# Patient Record
Sex: Female | Born: 1985
Health system: Southern US, Community
[De-identification: ages and names within clinical notes are randomized; demographics above are authoritative.]

## PROBLEM LIST (undated history)

## (undated) ENCOUNTER — Inpatient Hospital Stay (HOSPITAL_COMMUNITY): Payer: Self-pay

## (undated) DIAGNOSIS — T7840XA Allergy, unspecified, initial encounter: Secondary | ICD-10-CM

## (undated) DIAGNOSIS — K219 Gastro-esophageal reflux disease without esophagitis: Secondary | ICD-10-CM

## (undated) DIAGNOSIS — G43909 Migraine, unspecified, not intractable, without status migrainosus: Secondary | ICD-10-CM

## (undated) DIAGNOSIS — D649 Anemia, unspecified: Secondary | ICD-10-CM

## (undated) DIAGNOSIS — R519 Headache, unspecified: Secondary | ICD-10-CM

## (undated) DIAGNOSIS — R51 Headache: Secondary | ICD-10-CM

## (undated) DIAGNOSIS — G039 Meningitis, unspecified: Secondary | ICD-10-CM

## (undated) HISTORY — DX: Allergy, unspecified, initial encounter: T78.40XA

## (undated) HISTORY — PX: WISDOM TOOTH EXTRACTION: SHX21

## (undated) HISTORY — DX: Anemia, unspecified: D64.9

---
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
## 2006-08-17 ENCOUNTER — Other Ambulatory Visit: Admission: RE | Admit: 2006-08-17 | Discharge: 2006-08-17 | Payer: Self-pay | Admitting: Obstetrics & Gynecology

## 2007-02-05 ENCOUNTER — Other Ambulatory Visit: Admission: RE | Admit: 2007-02-05 | Discharge: 2007-02-05 | Payer: Self-pay | Admitting: Obstetrics and Gynecology

## 2007-09-03 ENCOUNTER — Other Ambulatory Visit: Admission: RE | Admit: 2007-09-03 | Discharge: 2007-09-03 | Payer: Self-pay | Admitting: Obstetrics and Gynecology

## 2008-09-08 ENCOUNTER — Other Ambulatory Visit: Admission: RE | Admit: 2008-09-08 | Discharge: 2008-09-08 | Payer: Self-pay | Admitting: Obstetrics and Gynecology

## 2010-06-24 ENCOUNTER — Emergency Department (HOSPITAL_COMMUNITY): Admission: EM | Admit: 2010-06-24 | Discharge: 2010-06-25 | Payer: Self-pay | Admitting: Emergency Medicine

## 2010-09-19 LAB — HM PAP SMEAR

## 2011-09-05 DIAGNOSIS — G039 Meningitis, unspecified: Secondary | ICD-10-CM

## 2011-09-05 HISTORY — DX: Meningitis, unspecified: G03.9

## 2011-11-27 ENCOUNTER — Inpatient Hospital Stay (HOSPITAL_COMMUNITY)
Admission: EM | Admit: 2011-11-27 | Discharge: 2011-12-01 | DRG: 533 | Disposition: A | Discharge status: Home / Self Care | Payer: BC Managed Care – PPO | Attending: Internal Medicine | Admitting: Internal Medicine

## 2011-11-27 ENCOUNTER — Encounter (HOSPITAL_COMMUNITY): Payer: Self-pay | Admitting: *Deleted

## 2011-11-27 ENCOUNTER — Emergency Department (HOSPITAL_COMMUNITY): Payer: BC Managed Care – PPO

## 2011-11-27 DIAGNOSIS — E860 Dehydration: Secondary | ICD-10-CM | POA: Diagnosis not present

## 2011-11-27 DIAGNOSIS — A879 Viral meningitis, unspecified: Principal | ICD-10-CM | POA: Diagnosis present

## 2011-11-27 DIAGNOSIS — T368X5A Adverse effect of other systemic antibiotics, initial encounter: Secondary | ICD-10-CM | POA: Diagnosis not present

## 2011-11-27 DIAGNOSIS — G039 Meningitis, unspecified: Secondary | ICD-10-CM

## 2011-11-27 DIAGNOSIS — D72829 Elevated white blood cell count, unspecified: Secondary | ICD-10-CM | POA: Diagnosis present

## 2011-11-27 DIAGNOSIS — T375X5A Adverse effect of antiviral drugs, initial encounter: Secondary | ICD-10-CM | POA: Diagnosis not present

## 2011-11-27 DIAGNOSIS — N179 Acute kidney failure, unspecified: Secondary | ICD-10-CM | POA: Diagnosis not present

## 2011-11-27 LAB — GRAM STAIN

## 2011-11-27 LAB — BASIC METABOLIC PANEL
BUN: 7 mg/dL (ref 6–23)
CO2: 21 mEq/L (ref 19–32)
Calcium: 9 mg/dL (ref 8.4–10.5)
Chloride: 102 mEq/L (ref 96–112)
Creatinine, Ser: 0.65 mg/dL (ref 0.50–1.10)
GFR calc Af Amer: >90 mL/min (ref >90)
GFR calc non Af Amer: >90 mL/min (ref >90)
Glucose, Bld: 82 mg/dL (ref 70–99)
Potassium: 4.2 mEq/L (ref 3.5–5.1)
Sodium: 132 mEq/L — ABNORMAL LOW (ref 135–145)

## 2011-11-27 LAB — CSF CELL COUNT WITH DIFFERENTIAL
Eosinophils, CSF: 0 % (ref 0–1)
Eosinophils, CSF: 0 % (ref 0–1)
Lymphs, CSF: 62 % (ref 40–80)
Lymphs, CSF: 55 % (ref 40–80)
Monocyte-Macrophage-Spinal Fluid: 18 % (ref 15–45)
Monocyte-Macrophage-Spinal Fluid: 13 % — ABNORMAL LOW (ref 15–45)
RBC Count, CSF: 22 /mm3 — ABNORMAL HIGH
RBC Count, CSF: 2 /mm3 — ABNORMAL HIGH
Segmented Neutrophils-CSF: 20 % — ABNORMAL HIGH (ref 0–6)
Segmented Neutrophils-CSF: 32 % — ABNORMAL HIGH (ref 0–6)
Tube #: 1
Tube #: 4
WBC, CSF: 107 /mm3 (ref 0–5)
WBC, CSF: 148 /mm3 (ref 0–5)

## 2011-11-27 LAB — URINALYSIS, ROUTINE W REFLEX MICROSCOPIC
Bilirubin Urine: NEGATIVE
Glucose, UA: NEGATIVE mg/dL
Hgb urine dipstick: NEGATIVE
Ketones, ur: NEGATIVE mg/dL
Leukocytes, UA: NEGATIVE
Nitrite: NEGATIVE
Protein, ur: NEGATIVE mg/dL
Specific Gravity, Urine: 1.022 (ref 1.005–1.030)
Urobilinogen, UA: 0.2 mg/dL (ref 0.0–1.0)
pH: 6 (ref 5.0–8.0)

## 2011-11-27 LAB — DIFFERENTIAL
Basophils Absolute: 0 10*3/uL (ref 0.0–0.1)
Basophils Relative: 0 % (ref 0–1)
Eosinophils Absolute: 0.1 10*3/uL (ref 0.0–0.7)
Eosinophils Relative: 1 % (ref 0–5)
Lymphocytes Relative: 31 % (ref 12–46)
Lymphs Abs: 2.1 10*3/uL (ref 0.7–4.0)
Monocytes Absolute: 0.5 10*3/uL (ref 0.1–1.0)
Monocytes Relative: 7 % (ref 3–12)
Neutro Abs: 4.1 10*3/uL (ref 1.7–7.7)
Neutrophils Relative %: 61 % (ref 43–77)

## 2011-11-27 LAB — CBC
HCT: 37.4 % (ref 36.0–46.0)
Hemoglobin: 12.7 g/dL (ref 12.0–15.0)
MCH: 29.3 pg (ref 26.0–34.0)
MCHC: 34 g/dL (ref 30.0–36.0)
MCV: 86.4 fL (ref 78.0–100.0)
Platelets: 313 10*3/uL (ref 150–400)
RBC: 4.33 MIL/uL (ref 3.87–5.11)
RDW: 12.2 % (ref 11.5–15.5)
WBC: 6.8 10*3/uL (ref 4.0–10.5)

## 2011-11-27 LAB — PROTEIN AND GLUCOSE, CSF
Glucose, CSF: 51 mg/dL (ref 43–76)
Total  Protein, CSF: 26 mg/dL (ref 15–45)

## 2011-11-27 MED ORDER — VANCOMYCIN HCL IN DEXTROSE 1-5 GM/200ML-% IV SOLN
1000.0000 mg | Freq: Once | INTRAVENOUS | Status: AC
  Administered 2011-11-27: 1000 mg via INTRAVENOUS
  Filled 2011-11-27: qty 200

## 2011-11-27 MED ORDER — ACETAMINOPHEN 325 MG PO TABS
650.0000 mg | ORAL_TABLET | Freq: Once | ORAL | Status: AC
  Administered 2011-11-27: 650 mg via ORAL
  Filled 2011-11-27: qty 2

## 2011-11-27 MED ORDER — SODIUM CHLORIDE 0.9 % IJ SOLN
3.0000 mL | Freq: Two times a day (BID) | INTRAMUSCULAR | Status: DC
  Administered 2011-11-29 – 2011-11-30 (×2): 3 mL via INTRAVENOUS

## 2011-11-27 MED ORDER — SODIUM CHLORIDE 0.9 % IV SOLN
INTRAVENOUS | Status: DC
  Administered 2011-11-27: 18:00:00 via INTRAVENOUS

## 2011-11-27 MED ORDER — HYDROMORPHONE HCL PF 1 MG/ML IJ SOLN
1.0000 mg | INTRAMUSCULAR | Status: DC | PRN
  Administered 2011-11-27: 1 mg via INTRAVENOUS

## 2011-11-27 MED ORDER — SODIUM CHLORIDE 0.9 % IV SOLN
INTRAVENOUS | Status: DC
  Administered 2011-11-27: 16:00:00 via INTRAVENOUS

## 2011-11-27 MED ORDER — CEFTRIAXONE SODIUM 2 G IJ SOLR
2.0000 g | INTRAMUSCULAR | Status: DC
  Administered 2011-11-27: 2 g via INTRAVENOUS
  Filled 2011-11-27: qty 2

## 2011-11-27 MED ORDER — ONDANSETRON HCL 4 MG/2ML IJ SOLN: 4.0000 mg | Freq: Three times a day (TID) | INTRAMUSCULAR | Status: DC | PRN

## 2011-11-27 MED ORDER — OXYCODONE HCL 5 MG PO TABS
5.0000 mg | ORAL_TABLET | ORAL | Status: DC | PRN
  Administered 2011-11-28 (×2): 5 mg via ORAL
  Filled 2011-11-27 (×2): qty 1

## 2011-11-27 MED ORDER — ONDANSETRON HCL 4 MG PO TABS
4.0000 mg | ORAL_TABLET | Freq: Four times a day (QID) | ORAL | Status: DC | PRN
  Administered 2011-11-28: 4 mg via ORAL
  Filled 2011-11-27: qty 1

## 2011-11-27 MED ORDER — SODIUM CHLORIDE 0.9 % IJ SOLN: 3.0000 mL | INTRAMUSCULAR | Status: DC | PRN

## 2011-11-27 MED ORDER — SODIUM CHLORIDE 0.9 % IV SOLN: 250.0000 mL | INTRAVENOUS | Status: DC | PRN

## 2011-11-27 MED ORDER — LORATADINE 10 MG PO TABS
10.0000 mg | ORAL_TABLET | Freq: Every day | ORAL | Status: DC
  Administered 2011-11-27 – 2011-12-01 (×5): 10 mg via ORAL
  Filled 2011-11-27 (×6): qty 1

## 2011-11-27 MED ORDER — DEXTROSE 5 % IV SOLN
20.0000 mg/kg | Freq: Once | INTRAVENOUS | Status: AC
  Administered 2011-11-27: 1140 mg via INTRAVENOUS
  Filled 2011-11-27: qty 22.8

## 2011-11-27 MED ORDER — HYDROMORPHONE HCL PF 1 MG/ML IJ SOLN
0.5000 mg | INTRAMUSCULAR | Status: DC | PRN
  Administered 2011-11-27 – 2011-11-28 (×2): 0.5 mg via INTRAVENOUS
  Filled 2011-11-27 (×2): qty 1

## 2011-11-27 MED ORDER — ACETAMINOPHEN 650 MG RE SUPP: 650.0000 mg | Freq: Four times a day (QID) | RECTAL | Status: DC | PRN

## 2011-11-27 MED ORDER — HYDROMORPHONE HCL PF 1 MG/ML IJ SOLN
INTRAMUSCULAR | Status: AC
  Filled 2011-11-27: qty 1

## 2011-11-27 MED ORDER — NORETHIN ACE-ETH ESTRAD-FE 1-20 MG-MCG(24) PO TABS
1.0000 | ORAL_TABLET | Freq: Every day | ORAL | Status: DC
  Administered 2011-11-28: 1 via ORAL
  Filled 2011-11-27 (×2): qty 1

## 2011-11-27 MED ORDER — DEXTROSE 5 % IV SOLN
2.0000 g | Freq: Two times a day (BID) | INTRAVENOUS | Status: DC
  Administered 2011-11-28 – 2011-12-01 (×7): 2 g via INTRAVENOUS
  Filled 2011-11-27 (×9): qty 2

## 2011-11-27 MED ORDER — ACETAMINOPHEN 325 MG PO TABS
650.0000 mg | ORAL_TABLET | Freq: Four times a day (QID) | ORAL | Status: DC | PRN
  Administered 2011-11-27 – 2011-12-01 (×7): 650 mg via ORAL
  Filled 2011-11-27 (×7): qty 2

## 2011-11-27 MED ORDER — ONDANSETRON HCL 4 MG/2ML IJ SOLN
4.0000 mg | Freq: Four times a day (QID) | INTRAMUSCULAR | Status: DC | PRN
  Administered 2011-11-27 – 2011-11-28 (×2): 4 mg via INTRAVENOUS
  Filled 2011-11-27 (×2): qty 2

## 2011-11-27 MED ORDER — VANCOMYCIN HCL IN DEXTROSE 1-5 GM/200ML-% IV SOLN
1000.0000 mg | Freq: Three times a day (TID) | INTRAVENOUS | Status: DC
  Administered 2011-11-27: 1000 mg via INTRAVENOUS
  Filled 2011-11-27 (×3): qty 200

## 2011-11-27 NOTE — Progress Notes (Signed)
ANTIBIOTIC CONSULT NOTE - INITIAL  Pharmacy Consult for Vancomycin Indication: r/o meningitis  Allergies  Allergen Reactions  . Benadryl Allergy   . Tamiflu     Patient Measurements: Height: 5\' 5"  (165.1 cm) Weight: 209 lb (94.802 kg) IBW/kg (Calculated) : 57  Adjusted Body Weight: 72.1 kg  Vital Signs: Temp: 99.6 F (37.6 C) (03/25 1706) Temp src: Oral (03/25 1706) BP: 118/76 mmHg (03/25 1706) Pulse Rate: 99  (03/25 1706) Intake/Output from previous day:   Intake/Output from this shift:    Labs:  Basename 11/27/11 1230  WBC 6.8  HGB 12.7  PLT 313  LABCREA --  CREATININE 0.65   Estimated Creatinine Clearance: 123.4 ml/min (by C-G formula based on Cr of 0.65). No results found for this basename: VANCOTROUGH:2,VANCOPEAK:2,VANCORANDOM:2,GENTTROUGH:2,GENTPEAK:2,GENTRANDOM:2,TOBRATROUGH:2,TOBRAPEAK:2,TOBRARND:2,AMIKACINPEAK:2,AMIKACINTROU:2,AMIKACIN:2, in the last 72 hours   Microbiology: Recent Results (from the past 720 hour(s))  GRAM STAIN     Status: Normal   Collection Time   11/27/11  2:02 PM      Component Value Range Status Comment   Specimen Description CSF   Final    Special Requests NONE   Final    Gram Stain     Final    Value: WBC PRESENT,BOTH PMN AND MONONUCLEAR     NO ORGANISMS SEEN     RESULTS CALLED TO F. HAZ RN AT 1505 ON [id number] BY SHUEA   Report Status 11/27/2011 FINAL   Final     Medical History: Past Medical History  Diagnosis Date  . No pertinent past medical history     Assessment: 25yof presenting with s/sx's concerning for meningitis to begin vancomycin and cefepime for treatment until cultures negative.  Patient has already received a one-time 1g dose of vancomycin in ED.  CrCl>100 mL/min.  Goal of Therapy:  Vancomycin trough level 15-20 mcg/ml  Plan:  Vancomycin 1g IV q8h. Check trough at steady state.  Clance Boll 11/27/2011,6:57 PM

## 2011-11-27 NOTE — ED Notes (Signed)
Pt alert and oriented x4. Respirations even and unlabored, bilateral symmetrical rise and fall of chest. Skin warm and dry. In no acute distress. Denies needs.   

## 2011-11-27 NOTE — ED Notes (Signed)
Pt is from home, alert and oriented x4, ambulatory. Pt reports since Thursday 3/21 pt has had a headache, at present 5/10. But pain has been 10/10. On Sunday 3/24 pt c/o of neck pain and stiffness. Today pt started having fever, chills, and sweats. Diarrhea started today. Denies dysuria or cough.

## 2011-11-27 NOTE — ED Notes (Signed)
[id number]<JY> Expected date:<BR> Expected time:<BR> Means of arrival:<BR> Comments:<BR> Meningitis symptoms from PCP

## 2011-11-27 NOTE — ED Notes (Signed)
md alerted of critical values, lab performing gram stain at moment

## 2011-11-27 NOTE — H&P (Signed)
History and Physical  Tasha Hansen ZOX:[id number] DOB: [birth year] DOA: 11/27/2011  Referring physician: Dayton Bailiff, MD PCP: Ancil Boozer, MD, MD   Chief Complaint: Headache  HPI:  26 year old woman with no significant past medical history who presents with a history of fever, headache and neck pain. Headache began 5 days ago described as being whole head and constant in nature. Patient felt poorly but had nonspecific symptoms until the day when she developed fever and neck pain. She was referred by her primary care physician to the emergency department for further evaluation to exclude meningitis. She had a coworker who was sick with an unspecified illness but did not require hospitalization.  In emergency department the patient was noted to be afebrile and vital signs were stable. Chest x-ray and urinalysis were unremarkable. Lumbar puncture was performed and was notable for leukocytosis but Gram stain was negative. She was given vancomycin, Rocephin and acyclovir and referred for admission.  Review of Systems:  Negative for changes to her vision, sore throat, rash, muscle aches, chest pain, shortness of breath, dysuria, bleeding, nausea, vomiting, abdominal pain, diarrhea.  Positive for an episode of diarrhea today, minimal photophobia  Past medical history: None Past Surgical History  Procedure Date  . Hernia repair     as an infant   Social History:  reports that she has never smoked. She does not have any smokeless tobacco history on file. She reports that she does not drink alcohol or use illicit drugs. 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 She does not have patient contact.  Allergies  Allergen Reactions  . Benadryl Allergy   . Tamiflu    Patient denies any particular medical problems in her family.  Prior to Admission medications   Medication Sig Start Date End Date Taking? Authorizing Provider  acetaminophen (TYLENOL) 500 MG tablet Take 1,000 mg by mouth every  6 (six) hours as needed.   Yes Historical Provider, MD  Biotin 5000 MCG CAPS Take 1 capsule by mouth daily.   Yes Historical Provider, MD  loratadine (CLARITIN) 10 MG tablet Take 10 mg by mouth daily.   Yes Historical Provider, MD  Multiple Vitamin (MULITIVITAMIN WITH MINERALS) TABS Take 1 tablet by mouth daily.   Yes Historical Provider, MD  Norethindrone Acetate-Ethinyl Estrad-FE (LOESTRIN 24 FE) 1-20 MG-MCG(24) tablet Take 1 tablet by mouth daily.   Yes Historical Provider, MD  tetrahydrozoline 0.05 % ophthalmic solution Place 1 drop into both eyes 2 (two) times daily.   Yes Historical Provider, MD   Physical Exam: Filed Vitals:   11/27/11 1206 11/27/11 1500  BP: 139/87   Pulse: 105   Temp: 99.9 F (37.7 C)   TempSrc: Oral   Resp: 16   Height:  5\' 5"  (1.651 m)  Weight:  94.802 kg (209 lb)  SpO2: 100%      General:  Appears calm and comfortable. Well-appearing. No apparent pain.  Eyes: Pupils equal, round, react to light. Normal lids, irises, conjunctiva.  ENT: Grossly normal hearing. Normal lips and tongue. Good dentition.  Neck: No lymphadenopathy or masses. No thyromegaly. Supple.  Cardiovascular: Regular rate and rhythm. No murmur, rub, gallop. No lower extremity edema.  Respiratory:  Clear to auscultation bilaterally. No wheezes, rales, rhonchi. Normal respiratory effort.  Abdomen: Soft, nontender, nondistended.  Skin: No rash or lesions seen. No induration.  Musculoskeletal: Tone and strength upper and lower extremities bilaterally. Digits and nails upper and lower joints appear unremarkable.  Psychiatric: Grossly normal mood and affect. Speech fluent and  appropriate. Alert.  Neurologic: Appears grossly normal.  Labs on Admission:  Basic Metabolic Panel:  Lab 11/27/11 3086  NA 132*  K 4.2  CL 102  CO2 21  GLUCOSE 82  BUN 7  CREATININE 0.65  CALCIUM 9.0  MG --  PHOS --   CBC:  Lab 11/27/11 1230  WBC 6.8  NEUTROABS 4.1  HGB 12.7  HCT 37.4  MCV  86.4  PLT 313   Radiological Exams on Admission: Dg Chest 2 View  11/27/2011  *RADIOLOGY REPORT*  Clinical Data: Headache and fever.  CHEST - 2 VIEW  Comparison: 06/22/2004.  Findings: The cardiac silhouette, mediastinal and hilar contours are within normal limits and stable. The lungs are clear.  No pleural effusions.  The bony thorax is intact.  IMPRESSION: No acute cardiopulmonary findings.  Original Report Authenticated By: P. Loralie Champagne, M.D.   Assessment/Plan 1. Fever, headache, neck pain: Plain as below. 2. Possible meningitis: Would favor a viral process based on chronicity of symptoms and clinical presentation. Mentation is normal in exam is unremarkable. Followup spinal fluid culture and continue broad-spectrum antibiotics until cultures negative. However there is no reason to suspect herpes encephalitis or meningitis and therefore will discontinue acyclovir. She has had the meningococcal vaccine.  Repeat laboratory studies in the morning. Check urine pregnancy.  Discussed with mother at bedside.  Code Status: Full code Family Communication: As above Disposition Plan: Home when improved  Brendia Sacks, MD  [facility] Pager [phone] 11/27/2011, 3:33 PM

## 2011-11-27 NOTE — ED Notes (Signed)
Informed consent obtained 

## 2011-11-27 NOTE — ED Provider Notes (Signed)
History     CSN: [id number]  Arrival date & time 11/27/11  1151   First MD Initiated Contact with Patient 11/27/11 1234      Chief Complaint  Patient presents with  . Neck Pain  . Fever  . Headache    (Consider location/radiation/quality/duration/timing/severity/associated sxs/prior treatment) HPI Comments: Sent fron pcp office for meningitis eval  Patient is a 26 y.o. female presenting with headaches. The history is provided by the patient. No language interpreter was used.  Headache  This is a new problem. The current episode started more than 2 days ago (3 days ago). The problem occurs constantly. The problem has been gradually worsening. The headache is associated with nothing. The pain is located in the frontal region. The quality of the pain is described as throbbing. The pain is moderate. The pain does not radiate. Associated symptoms include anorexia, a fever and malaise/fatigue. Pertinent negatives include no near-syncope, no orthopnea, no palpitations, no syncope, no shortness of breath, no nausea and no vomiting.    History reviewed. No pertinent past medical history.  Past Surgical History  Procedure Date  . Hernia repair     as an infant    History reviewed. No pertinent family history.  History  Substance Use Topics  . Smoking status: Never Smoker   . Smokeless tobacco: Not on file  . Alcohol Use: No    OB History    Grav Para Term Preterm Abortions TAB SAB Ect Mult Living                  Review of Systems  Constitutional: Positive for fever, chills, malaise/fatigue, activity change and fatigue. Negative for appetite change.  HENT: Positive for neck pain and neck stiffness. Negative for congestion, sore throat and rhinorrhea.   Respiratory: Negative for cough and shortness of breath.   Cardiovascular: Negative for chest pain, palpitations, orthopnea, syncope and near-syncope.  Gastrointestinal: Positive for anorexia. Negative for nausea, vomiting and  abdominal pain.  Genitourinary: Negative for dysuria, urgency, frequency and flank pain.  Musculoskeletal: Positive for myalgias. Negative for back pain and arthralgias.  Neurological: Positive for headaches. Negative for dizziness, weakness and light-headedness.  All other systems reviewed and are negative.    Allergies  Benadryl allergy and Tamiflu  Home Medications   Current Outpatient Rx  Name Route Sig Dispense Refill  . ACETAMINOPHEN 500 MG PO TABS Oral Take 1,000 mg by mouth every 6 (six) hours as needed.    Marland Kitchen BIOTIN 5000 MCG PO CAPS Oral Take 1 capsule by mouth daily.    Marland Kitchen LORATADINE 10 MG PO TABS Oral Take 10 mg by mouth daily.    . ADULT MULTIVITAMIN W/MINERALS CH Oral Take 1 tablet by mouth daily.    Azzie Roup ACE-ETH ESTRAD-FE 1-20 MG-MCG(24) PO TABS Oral Take 1 tablet by mouth daily.    . TETRAHYDROZOLINE HCL 0.05 % OP SOLN Both Eyes Place 1 drop into both eyes 2 (two) times daily.      BP 139/87  Pulse 105  Temp(Src) 99.9 F (37.7 C) (Oral)  Resp 16  Ht 5\' 5"  (1.651 m)  Wt 209 lb (94.802 kg)  BMI 34.78 kg/m2  SpO2 100%  LMP 11/19/2011  Physical Exam  Nursing note and vitals reviewed. Constitutional: She is oriented to person, place, and time. She appears well-developed and well-nourished. No distress.  HENT:  Head: Normocephalic and atraumatic.  Mouth/Throat: Oropharynx is clear and moist. No oropharyngeal exudate.  Eyes: Conjunctivae and EOM are normal.  Pupils are equal, round, and reactive to light.  Neck: Normal range of motion. Neck supple.       No nuchal rigidity or meningeal signs  Cardiovascular: Normal rate, regular rhythm, normal heart sounds and intact distal pulses.  Exam reveals no gallop and no friction rub.   No murmur heard. Pulmonary/Chest: Effort normal and breath sounds normal. No respiratory distress. She exhibits no tenderness.  Abdominal: Soft. Bowel sounds are normal. There is no tenderness.  Musculoskeletal: Normal range of motion.  She exhibits no tenderness.  Neurological: She is alert and oriented to person, place, and time. No cranial nerve deficit.  Skin: Skin is warm and dry. No rash noted.    ED Course  LUMBAR PUNCTURE Date/Time: 11/27/2011 2:03 PM Performed by: Dayton Bailiff Authorized by: Dayton Bailiff Consent: Verbal consent obtained. Written consent obtained. Risks and benefits: risks, benefits and alternatives were discussed Consent given by: patient Patient identity confirmed: verbally with patient Indications: evaluation for infection Local anesthetic: lidocaine 1% without epinephrine Anesthetic total: 3 ml Patient sedated: no Preparation: Patient was prepped and draped in the usual sterile fashion. Lumbar space: L4-L5 interspace Patient's position: sitting Needle gauge: 20 Needle type: diamond point Needle length: 2.5 in Number of attempts: 2 Fluid appearance: clear Tubes of fluid: 4 Total volume: 4 ml Post-procedure: site cleaned and adhesive bandage applied Patient tolerance: Patient tolerated the procedure well with no immediate complications.   (including critical care time)  Labs Reviewed  BASIC METABOLIC PANEL - Abnormal; Notable for the following:    Sodium 132 (*)    All other components within normal limits  CSF CELL COUNT WITH DIFFERENTIAL - Abnormal; Notable for the following:    RBC Count, CSF 22 (*)    WBC, CSF 107 (*)    Segmented Neutrophils-CSF 20 (*)    All other components within normal limits  CSF CELL COUNT WITH DIFFERENTIAL - Abnormal; Notable for the following:    RBC Count, CSF 2 (*)    WBC, CSF 148 (*)    All other components within normal limits  CBC  DIFFERENTIAL  URINALYSIS, ROUTINE W REFLEX MICROSCOPIC  PROTEIN AND GLUCOSE, CSF  GRAM STAIN  URINE CULTURE  CULTURE, BLOOD (ROUTINE X 2)  CULTURE, BLOOD (ROUTINE X 2)  CSF CULTURE   Dg Chest 2 View  11/27/2011  *RADIOLOGY REPORT*  Clinical Data: Headache and fever.  CHEST - 2 VIEW  Comparison:  06/22/2004.  Findings: The cardiac silhouette, mediastinal and hilar contours are within normal limits and stable. The lungs are clear.  No pleural effusions.  The bony thorax is intact.  IMPRESSION: No acute cardiopulmonary findings.  Original Report Authenticated By: P. Loralie Champagne, M.D.     1. Meningitis       MDM  Likely viral however was placed on vancomycin, Rocephin, acyclovir for coverage. Initial Gram stain negative. Protein and glucose normal. Fluids. Will be admitted for observation to ensure no growth and CSF. Discussed with the [facility] who accepted the patient to a medical bed. Remained in droplet precautions.        Dayton Bailiff, MD 11/27/11 [phone]

## 2011-11-27 NOTE — ED Notes (Signed)
Report given to cynthia, rn on floor 

## 2011-11-28 LAB — BASIC METABOLIC PANEL
BUN: 11 mg/dL (ref 6–23)
CO2: 23 mEq/L (ref 19–32)
Calcium: 8.6 mg/dL (ref 8.4–10.5)
Chloride: 101 mEq/L (ref 96–112)
Creatinine, Ser: 1.74 mg/dL — ABNORMAL HIGH (ref 0.50–1.10)
GFR calc Af Amer: 46 mL/min — ABNORMAL LOW (ref >90)
GFR calc non Af Amer: 40 mL/min — ABNORMAL LOW (ref >90)
Glucose, Bld: 103 mg/dL — ABNORMAL HIGH (ref 70–99)
Potassium: 3.8 mEq/L (ref 3.5–5.1)
Sodium: 132 mEq/L — ABNORMAL LOW (ref 135–145)

## 2011-11-28 LAB — CBC
HCT: 36.5 % (ref 36.0–46.0)
Hemoglobin: 12.1 g/dL (ref 12.0–15.0)
MCH: 29 pg (ref 26.0–34.0)
MCHC: 33.2 g/dL (ref 30.0–36.0)
MCV: 87.5 fL (ref 78.0–100.0)
Platelets: 223 10*3/uL (ref 150–400)
RBC: 4.17 MIL/uL (ref 3.87–5.11)
RDW: 12.4 % (ref 11.5–15.5)
WBC: 8.5 10*3/uL (ref 4.0–10.5)

## 2011-11-28 LAB — PREGNANCY, URINE: Preg Test, Ur: NEGATIVE

## 2011-11-28 LAB — VANCOMYCIN, RANDOM: Vancomycin Rm: 16.9 ug/mL

## 2011-11-28 LAB — URINE CULTURE
Colony Count: >=100000
Culture  Setup Time: 201303260219

## 2011-11-28 MED ORDER — IBUPROFEN 600 MG PO TABS
600.0000 mg | ORAL_TABLET | Freq: Four times a day (QID) | ORAL | Status: DC | PRN
  Administered 2011-11-28: 600 mg via ORAL
  Filled 2011-11-28: qty 1

## 2011-11-28 MED ORDER — PROMETHAZINE HCL 25 MG/ML IJ SOLN
25.0000 mg | Freq: Once | INTRAMUSCULAR | Status: AC
  Administered 2011-11-28: 25 mg via INTRAVENOUS
  Filled 2011-11-28: qty 1

## 2011-11-28 MED ORDER — SODIUM CHLORIDE 0.9 % IV SOLN: 250.0000 mL | INTRAVENOUS | Status: DC | PRN

## 2011-11-28 MED ORDER — VANCOMYCIN HCL IN DEXTROSE 1-5 GM/200ML-% IV SOLN
1000.0000 mg | Freq: Once | INTRAVENOUS | Status: DC
  Filled 2011-11-28: qty 200

## 2011-11-28 MED ORDER — VANCOMYCIN HCL IN DEXTROSE 1-5 GM/200ML-% IV SOLN
1000.0000 mg | Freq: Once | INTRAVENOUS | Status: AC
  Administered 2011-11-28: 1000 mg via INTRAVENOUS
  Filled 2011-11-28: qty 200

## 2011-11-28 NOTE — Progress Notes (Signed)
PROGRESS NOTE  Tasha Hansen HYQ:[id number] DOB: [birth year] DOA: 11/27/2011 PCP: Ancil Boozer, MD, MD  Brief narrative: 26 year old woman with no significant past medical history who presented with a history of fever, headache and neck pain. Lumbar puncture was performed and was notable for leukocytosis but Gram stain was negative and admitted to rule out bacterial meningitis.  Past medical history: None  Consultants:  None  Procedures:  March 25: Lumbar puncture  Antibiotics:  March 25: Vancomycin  March 25: Rocephin  Interim History: Interval documentation reviewed. Febrile to 102.9 this morning. Vital signs otherwise stable. Vancomycin trough high. Acute renal failure. Blood cultures no growth to date.  Subjective: Feels well now. Denies headache. No neck pain. No complaints.  Objective: Filed Vitals:   11/27/11 1706 11/27/11 2215 11/28/11 0535 11/28/11 0607  BP: 118/76 111/64 123/73   Pulse: 99 100 109   Temp: 99.6 F (37.6 C) 99.3 F (37.4 C) 102.9 F (39.4 C) 101.1 F (38.4 C)  TempSrc: Oral Oral Oral Oral  Resp: 20 20 20    Height: 5\' 5"  (1.651 m)     Weight: 94.802 kg (209 lb)     SpO2: 99% 98% 100%    No intake or output data in the 24 hours ending 11/28/11 1043  Exam:   General:  Well-appearing. Smiles.  Neck: Full range of movement without pain.  Cardiovascular: Regular rate and rhythm. No murmur, rub, gallop. No lower extremity edema.  Respiratory: Clear to auscultation bilaterally. No wheezes, rales, rhonchi. Normal respiratory effort.  Psychiatric: Grossly normal mood and affect. Speech fluent and appropriate.  Data Reviewed: Basic Metabolic Panel:  Lab 11/28/11 9528 11/27/11 1230  NA 132* 132*  K 3.8 4.2  CL 101 102  CO2 23 21  GLUCOSE 103* 82  BUN 11 7  CREATININE 1.74* 0.65  CALCIUM 8.6 9.0  MG -- --  PHOS -- --   CBC:  Lab 11/28/11 0522 11/27/11 1230  WBC 8.5 6.8  NEUTROABS -- 4.1  HGB 12.1 12.7  HCT 36.5 37.4  MCV  87.5 86.4  PLT 223 313   Recent Results (from the past 240 hour(s))  CULTURE, BLOOD (ROUTINE X 2)     Status: Normal (Preliminary result)   Collection Time   11/27/11  1:15 PM      Component Value Range Status Comment   Specimen Description BLOOD LEFT ANTECUBITAL   Final    Special Requests BOTTLES DRAWN AEROBIC AND ANAEROBIC   Final    Culture  Setup Time [id number]   Final    Culture     Final    Value:        BLOOD CULTURE RECEIVED NO GROWTH TO DATE CULTURE WILL BE HELD FOR 5 DAYS BEFORE ISSUING A FINAL NEGATIVE REPORT   Report Status PENDING   Incomplete   CULTURE, BLOOD (ROUTINE X 2)     Status: Normal (Preliminary result)   Collection Time   11/27/11  1:20 PM      Component Value Range Status Comment   Specimen Description BLOOD RIGHT ANTECUBITAL   Final    Special Requests BOTTLES DRAWN AEROBIC AND ANAEROBIC   Final    Culture  Setup Time [id number]   Final    Culture     Final    Value:        BLOOD CULTURE RECEIVED NO GROWTH TO DATE CULTURE WILL BE HELD FOR 5 DAYS BEFORE ISSUING A FINAL NEGATIVE REPORT   Report Status PENDING   Incomplete  CSF CULTURE     Status: Normal (Preliminary result)   Collection Time   11/27/11  2:02 PM      Component Value Range Status Comment   Specimen Description CSF   Final    Special Requests NONE   Final    Gram Stain     Final    Value: CYTOSPIN WBC PRESENT,BOTH PMN AND MONONUCLEAR     NO ORGANISMS SEEN   Culture NO GROWTH   Final    Report Status PENDING   Incomplete   GRAM STAIN     Status: Normal   Collection Time   11/27/11  2:02 PM      Component Value Range Status Comment   Specimen Description CSF   Final    Special Requests NONE   Final    Gram Stain     Final    Value: WBC PRESENT,BOTH PMN AND MONONUCLEAR     NO ORGANISMS SEEN     RESULTS CALLED TO F. HAZ RN AT 1505 ON [id number] BY SHUEA   Report Status 11/27/2011 FINAL   Final     Studies:  Scheduled Meds:   . acetaminophen  650 mg Oral Once  . acyclovir  20  mg/kg (Ideal) Intravenous Once  . cefTRIAXone (ROCEPHIN)  IV  2 g Intravenous Q12H  . HYDROmorphone      . loratadine  10 mg Oral Daily  . Norethindrone Acetate-Ethinyl Estrad-FE  1 tablet Oral Daily  . sodium chloride  3 mL Intravenous Q12H  . vancomycin  1,000 mg Intravenous Once  . vancomycin  1,000 mg Intravenous Once  . DISCONTD: sodium chloride   Intravenous STAT  . DISCONTD: cefTRIAXone (ROCEPHIN)  IV  2 g Intravenous Q24H  . DISCONTD: vancomycin  1,000 mg Intravenous Q8H   Continuous Infusions:   . DISCONTD: sodium chloride 125 mL/hr at 11/27/11 1550    Assessment/Plan: 1. Fever, headache, neck pain: Plan as below. 2. Possible meningitis: Favor a viral process based on chronicity of symptoms and clinical presentation. Mentation is normal in exam is unremarkable. CSF culture no growth today. Continue broad-spectrum antibiotics pending final culture. She has had the meningococcal vaccine. 3. Acute renal failure: IV fluids. Possibly secondary to vancomycin. Also received a dose of acyclovir yesterday. Discontinue ibuprofen. Repeat basic metabolic panel the morning.  Discussed with patient and mother bedside. All questions answered to their apparent satisfaction.   Code Status: Full code  Family Communication: As above  Disposition Plan: Home when improved    Brendia Sacks, MD  [facility] Pager [phone] 11/28/2011, 10:43 AM    LOS: 1 day

## 2011-11-28 NOTE — Progress Notes (Signed)
Pt had a temp of 102.9. Tylenol given. Temp rechecked an hour later. Temp is now 101.1. Night coverage notified. Will continue to monitor.

## 2011-11-28 NOTE — Progress Notes (Signed)
ANTIBIOTIC CONSULT NOTE - FOLLOW UP  Pharmacy Consult for Vancomycin Indication: r/o meningitis  Allergies  Allergen Reactions  . Benadryl Allergy   . Tamiflu    Patient Measurements: Height: 5\' 5"  (165.1 cm) Weight: 209 lb (94.802 kg) IBW/kg (Calculated) : 57  Adjusted Body Weight: 68kg  Vital Signs: Temp: 101.1 F (38.4 C) (03/26 0607) Temp src: Oral (03/26 0607) BP: 123/73 mmHg (03/26 0535) Pulse Rate: 109  (03/26 0535) Intake/Output from previous day:   Intake/Output from this shift:    Labs:  Basename 11/28/11 0522 11/27/11 1230  WBC 8.5 6.8  HGB 12.1 12.7  PLT 223 313  LABCREA -- --  CREATININE 1.74* 0.65   Estimated Creatinine Clearance: 56.7 ml/min (by C-G formula based on Cr of 1.74).  Basename 11/28/11 0655  VANCOTROUGH --  Leodis Binet --  VANCORANDOM 16.9  GENTTROUGH --  GENTPEAK --  GENTRANDOM --  TOBRATROUGH --  Nolen Mu --  TOBRARND --  AMIKACINPEAK --  AMIKACINTROU --  AMIKACIN --     Microbiology: Recent Results (from the past 720 hour(s))  CSF CULTURE     Status: Normal (Preliminary result)   Collection Time   11/27/11  2:02 PM      Component Value Range Status Comment   Specimen Description CSF   Final    Special Requests NONE   Final    Gram Stain     Final    Value: CYTOSPIN WBC PRESENT,BOTH PMN AND MONONUCLEAR     NO ORGANISMS SEEN   Culture NO GROWTH   Final    Report Status PENDING   Incomplete   GRAM STAIN     Status: Normal   Collection Time   11/27/11  2:02 PM      Component Value Range Status Comment   Specimen Description CSF   Final    Special Requests NONE   Final    Gram Stain     Final    Value: WBC PRESENT,BOTH PMN AND MONONUCLEAR     NO ORGANISMS SEEN     RESULTS CALLED TO F. HAZ RN AT 1505 ON [id number] BY SHUEA   Report Status 11/27/2011 FINAL   Final     Anti-infectives     Start     Dose/Rate Route Frequency Ordered Stop   11/28/11 0600   cefTRIAXone (ROCEPHIN) 2 g in dextrose 5 % 50 mL IVPB        2  g 100 mL/hr over 30 Minutes Intravenous Every 12 hours 11/27/11 1822     11/28/11 0000   vancomycin (VANCOCIN) IVPB 1000 mg/200 mL premix  Status:  Discontinued        1,000 mg 200 mL/hr over 60 Minutes Intravenous Every 8 hours 11/27/11 1902 11/28/11 0631   11/27/11 1600   cefTRIAXone (ROCEPHIN) 2 g in dextrose 5 % 50 mL IVPB  Status:  Discontinued        2 g 100 mL/hr over 30 Minutes Intravenous Every 24 hours 11/27/11 1504 11/27/11 1822   11/27/11 1600   vancomycin (VANCOCIN) IVPB 1000 mg/200 mL premix        1,000 mg 200 mL/hr over 60 Minutes Intravenous  Once 11/27/11 1504 11/27/11 1650   11/27/11 1530   acyclovir (ZOVIRAX) 1,140 mg in dextrose 5 % 250 mL IVPB        20 mg/kg  57 kg (Ideal) 272.8 mL/hr over 60 Minutes Intravenous  Once 11/27/11 1504 11/27/11 1919         Assessment:  Day 2 Vancomycin and Rocephin for possible meningitis.  SCr increased significantly overnight. CrCl estimate still ~57 d/t patient being young.  Random Vanc level in desired range for a trough, drawn ~8hrs after 1g dose.  Tm 101.1, WBCs wnl.  CSF, blood, urine cx in process.  Goal of Therapy:  Vancomycin trough level 15-20 mcg/ml  Plan:   Will give another Vanc 1g this am.  Recheck random Vanc and SCr 12hrs after.  Charolotte Eke, PharmD, pager [phone]. 11/28/2011,7:59 AM.

## 2011-11-28 NOTE — Progress Notes (Signed)
Pharmacy Brief Note: Serum creatinine has increased from 0.65 yesterday to 1.74 today on Vancomycin 1000mg  IV q8h.  Have ordered a random Vancomycin level for this AM and held the 0800 Vancomycin dose.  Will determine timing of next Vancomycin dose after reviewing this morning's serum level.  Elie Goody, PharmD, BCPS Pager: [phone] 11/28/2011  6:41 AM

## 2011-11-29 ENCOUNTER — Inpatient Hospital Stay (HOSPITAL_COMMUNITY): Payer: BC Managed Care – PPO

## 2011-11-29 DIAGNOSIS — G039 Meningitis, unspecified: Secondary | ICD-10-CM

## 2011-11-29 DIAGNOSIS — N179 Acute kidney failure, unspecified: Secondary | ICD-10-CM | POA: Diagnosis not present

## 2011-11-29 LAB — URINALYSIS, ROUTINE W REFLEX MICROSCOPIC
Bilirubin Urine: NEGATIVE
Glucose, UA: NEGATIVE mg/dL
Hgb urine dipstick: NEGATIVE
Ketones, ur: NEGATIVE mg/dL
Nitrite: NEGATIVE
Protein, ur: NEGATIVE mg/dL
Specific Gravity, Urine: 1.015 (ref 1.005–1.030)
Urobilinogen, UA: 0.2 mg/dL (ref 0.0–1.0)
pH: 6 (ref 5.0–8.0)

## 2011-11-29 LAB — VANCOMYCIN, TROUGH: Vancomycin Tr: 11.1 ug/mL (ref 10.0–20.0)

## 2011-11-29 LAB — BASIC METABOLIC PANEL
BUN: 15 mg/dL (ref 6–23)
BUN: 16 mg/dL (ref 6–23)
CO2: 25 mEq/L (ref 19–32)
CO2: 23 mEq/L (ref 19–32)
Calcium: 8.9 mg/dL (ref 8.4–10.5)
Calcium: 9.1 mg/dL (ref 8.4–10.5)
Chloride: 102 mEq/L (ref 96–112)
Chloride: 103 mEq/L (ref 96–112)
Creatinine, Ser: 2.01 mg/dL — ABNORMAL HIGH (ref 0.50–1.10)
Creatinine, Ser: 2.07 mg/dL — ABNORMAL HIGH (ref 0.50–1.10)
GFR calc Af Amer: 39 mL/min — ABNORMAL LOW (ref >90)
GFR calc Af Amer: 38 mL/min — ABNORMAL LOW (ref >90)
GFR calc non Af Amer: 34 mL/min — ABNORMAL LOW (ref >90)
GFR calc non Af Amer: 32 mL/min — ABNORMAL LOW (ref >90)
Glucose, Bld: 107 mg/dL — ABNORMAL HIGH (ref 70–99)
Glucose, Bld: 87 mg/dL (ref 70–99)
Potassium: 4.1 mEq/L (ref 3.5–5.1)
Potassium: 4.4 mEq/L (ref 3.5–5.1)
Sodium: 134 mEq/L — ABNORMAL LOW (ref 135–145)
Sodium: 136 mEq/L (ref 135–145)

## 2011-11-29 LAB — URINE MICROSCOPIC-ADD ON

## 2011-11-29 LAB — CREATININE, URINE, RANDOM: Creatinine, Urine: 109.3 mg/dL

## 2011-11-29 LAB — RPR: RPR Ser Ql: NONREACTIVE

## 2011-11-29 LAB — SODIUM, URINE, RANDOM: Sodium, Ur: <15 mEq/L

## 2011-11-29 MED ORDER — VANCOMYCIN HCL IN DEXTROSE 1-5 GM/200ML-% IV SOLN
1000.0000 mg | INTRAVENOUS | Status: DC
  Filled 2011-11-29: qty 200

## 2011-11-29 MED ORDER — SODIUM CHLORIDE 0.9 % IV BOLUS (SEPSIS): 1000.0000 mL | Freq: Once | INTRAVENOUS | Status: DC

## 2011-11-29 MED ORDER — SODIUM CHLORIDE 0.9 % IV SOLN: INTRAVENOUS | Status: DC

## 2011-11-29 NOTE — Progress Notes (Addendum)
PROGRESS NOTE  Tasha Hansen AVW:[id number] DOB: 1985/10/08 DOA: 11/27/2011 PCP: Ancil Boozer, MD, MD  Brief narrative: 26 year old woman with no significant past medical history who presented with a history of fever, headache and neck pain. Lumbar puncture was performed and was notable for leukocytosis but Gram stain was negative and admitted to rule out bacterial meningitis.  Past medical history: None  Consultants:  None  Procedures:  March 25: Lumbar puncture  Antibiotics:  March 25: Vancomycin  March 25: Rocephin  Interim History: Interval documentation reviewed. Febrile to 102.9 yesteday morning. Vital signs otherwise stable. Acute renal failure. Blood cultures no growth to date.  Subjective: Feels well now. Denies headache. No neck pain. No complaints. Worried that antibiotics causing renal failure.  Objective: Filed Vitals:   11/28/11 0607 11/28/11 1348 11/28/11 2230 11/29/11 0530  BP:  117/70 111/63 114/68  Pulse:  88 92 98  Temp: 101.1 F (38.4 C) 98.2 F (36.8 C) 99.7 F (37.6 C) 97.4 F (36.3 C)  TempSrc: Oral Oral Oral Oral  Resp:  18 20 20   Height:      Weight:      SpO2:  100% 100% 99%   No intake or output data in the 24 hours ending 11/29/11 1355  Exam:   General:  Well-appearing.  Neck: Full range of movement without pain.  Cardiovascular: Regular rate and rhythm. No murmur, rub, gallop. No lower extremity edema.  Respiratory: Clear to auscultation bilaterally. No wheezes, rales, rhonchi. Normal respiratory effort.  Psychiatric: Grossly normal mood and affect. Speech fluent and appropriate.  Data Reviewed: Basic Metabolic Panel:  Lab 11/29/11 8295 11/28/11 2327 11/28/11 0522 11/27/11 1230  NA 136 134* 132* 132*  K 4.4 4.1 -- --  CL 103 102 101 102  CO2 23 25 23 21   GLUCOSE 87 107* 103* 82  BUN 16 15 11 7   CREATININE 2.07* 2.01* 1.74* 0.65  CALCIUM 9.1 8.9 8.6 9.0  MG -- -- -- --  PHOS -- -- -- --   CBC:  Lab 11/28/11 0522  11/27/11 1230  WBC 8.5 6.8  NEUTROABS -- 4.1  HGB 12.1 12.7  HCT 36.5 37.4  MCV 87.5 86.4  PLT 223 313   Recent Results (from the past 240 hour(s))  CULTURE, BLOOD (ROUTINE X 2)     Status: Normal (Preliminary result)   Collection Time   11/27/11  1:15 PM      Component Value Range Status Comment   Specimen Description BLOOD LEFT ANTECUBITAL   Final    Special Requests BOTTLES DRAWN AEROBIC AND ANAEROBIC   Final    Culture  Setup Time [id number]   Final    Culture     Final    Value:        BLOOD CULTURE RECEIVED NO GROWTH TO DATE CULTURE WILL BE HELD FOR 5 DAYS BEFORE ISSUING A FINAL NEGATIVE REPORT   Report Status PENDING   Incomplete   CULTURE, BLOOD (ROUTINE X 2)     Status: Normal (Preliminary result)   Collection Time   11/27/11  1:20 PM      Component Value Range Status Comment   Specimen Description BLOOD RIGHT ANTECUBITAL   Final    Special Requests BOTTLES DRAWN AEROBIC AND ANAEROBIC   Final    Culture  Setup Time [id number]   Final    Culture     Final    Value:        BLOOD CULTURE RECEIVED NO GROWTH TO DATE CULTURE  WILL BE HELD FOR 5 DAYS BEFORE ISSUING A FINAL NEGATIVE REPORT   Report Status PENDING   Incomplete   URINE CULTURE     Status: Normal   Collection Time   11/27/11  1:33 PM      Component Value Range Status Comment   Specimen Description URINE, CLEAN CATCH   Final    Special Requests NONE   Final    Culture  Setup Time [id number]   Final    Colony Count >=100,000 COLONIES/ML   Final    Culture     Final    Value: Multiple bacterial morphotypes present, none predominant. Suggest appropriate recollection if clinically indicated.   Report Status 11/28/2011 FINAL   Final   CSF CULTURE     Status: Normal (Preliminary result)   Collection Time   11/27/11  2:02 PM      Component Value Range Status Comment   Specimen Description CSF   Final    Special Requests NONE   Final    Gram Stain     Final    Value: CYTOSPIN WBC PRESENT,BOTH PMN AND  MONONUCLEAR     NO ORGANISMS SEEN   Culture NO GROWTH 1 DAY   Final    Report Status PENDING   Incomplete   GRAM STAIN     Status: Normal   Collection Time   11/27/11  2:02 PM      Component Value Range Status Comment   Specimen Description CSF   Final    Special Requests NONE   Final    Gram Stain     Final    Value: WBC PRESENT,BOTH PMN AND MONONUCLEAR     NO ORGANISMS SEEN     RESULTS CALLED TO F. HAZ RN AT 1505 ON [id number] BY SHUEA   Report Status 11/27/2011 FINAL   Final     Studies:  Scheduled Meds:    . cefTRIAXone (ROCEPHIN)  IV  2 g Intravenous Q12H  . loratadine  10 mg Oral Daily  . Norethindrone Acetate-Ethinyl Estrad-FE  1 tablet Oral Daily  . promethazine  25 mg Intravenous Once  . sodium chloride  3 mL Intravenous Q12H  . vancomycin  1,000 mg Intravenous Once  . DISCONTD: vancomycin  1,000 mg Intravenous Once  . DISCONTD: vancomycin  1,000 mg Intravenous STAT   Continuous Infusions:   Assessment/Plan: 1. Fever, headache, neck pain: Plan as below. 2. Possible aseptic meningitis: Favor a viral process based on chronicity of symptoms and clinical presentation. Mentation is normal in exam is unremarkable. CSF culture no growth today. Gram stain negative.Continue Rocephin. D/C Vancomycin secondary to ARF. Will add HSV PCR and enterovirus PCR to CSF. Will consult with ID for further evaluation and rxcs. 3. Acute renal failure:. Possibly secondary to  Acyclovir and nsaids and possibly vancomycin. Also received a dose of acyclovir which could cause ATN. Acyclovir d/c 2 days ago. Will d/c Vancomycin. Place on IVF, FC, monitor urine output. Follow. If no improvement or worsening in next day  will formally consult renal. D/W Dr Arta Silence . Repeat basic metabolic panel the morning.  Discussed with patient and mother bedside. All questions answered to their apparent satisfaction.   Code Status: Full code  Family Communication: As above  Disposition Plan: Home when  improved    Ramiro Harvest MD  [facility] Pager [phone] 11/29/2011, 1:55 PM    LOS: 2 days

## 2011-11-29 NOTE — Consult Note (Signed)
Date of Admission:  11/27/2011  Date of Consult:  11/29/2011  Reason for Consult: Aseptic meningitis Referring Physician: Thompson  Impression/Recommendation Aseptic Meningitis Acute Renal Failure Would- stop vanco and IV acyclovir Continue Ceftriaxone for now Await CSF Cx CSF PCR for HSV and enterovirus Check HIV PCR, RPR Follow Cr Discussed with mother and patient that she most likely has viral/aseptic meningitis. She will continue on ceftriaxone while we await the results of CSF Cx.   Tasha Hansen is an 26 y.o. female.  HPI: 26 yo F with no PMHx comes [facility] on 3[facility]25 with 5 days headache, then 24 h of fever and neck pain. She underwent LP showing 148 WBC (55% L), normal Prot, Glc. Cx is NGTD. She was started on vanco/ceftriaxone/acyclovir and has since felt better. She has had temp to 102.9 since admission.  Cr has gone from 0.65 to 2.07.  ROS- no LAN, no rashes, did have photophobia on day of admission, nl BM, nl urination, no oral ulcers.   Past Medical History  Diagnosis Date  . No pertinent past medical history     Past Surgical History  Procedure Date  . Hernia repair     as an infant  ergies:   Allergies  Allergen Reactions  . Benadryl Allergy   . Tamiflu     Medications:  Scheduled:   . cefTRIAXone (ROCEPHIN)  IV  2 g Intravenous Q12H  . loratadine  10 mg Oral Daily  . Norethindrone Acetate-Ethinyl Estrad-FE  1 tablet Oral Daily  . promethazine  25 mg Intravenous Once  . sodium chloride  1,000 mL Intravenous Once  . sodium chloride  3 mL Intravenous Q12H  . vancomycin  1,000 mg Intravenous Once  . DISCONTD: vancomycin  1,000 mg Intravenous Once  . DISCONTD: vancomycin  1,000 mg Intravenous STAT    Social History:  reports that she has never smoked. She has never used smokeless tobacco. She reports that she does not drink alcohol or use illicit drugs.  History reviewed. No pertinent family history.  General ROS: see above  Blood pressure 114/74, pulse  97, temperature 98.6 F (37 C), temperature source Oral, resp. rate 18, height 5\' 5"  (1.651 m), weight 94.802 kg (209 lb), last menstrual period 11/19/2011, SpO2 100.00%. General appearance: alert, cooperative, appears stated age and no distress Eyes: negative findings: conjunctivae and sclerae normal and pupils equal, round, reactive to light and accomodation Throat: lips, mucosa, and tongue normal; teeth and gums normal Neck: no adenopathy and supple, symmetrical, trachea midline Lungs: clear to auscultation bilaterally Heart: regular rate and rhythm Abdomen: normal findings: bowel sounds normal and soft, non-tender Extremities: edema none Skin: Skin color, texture, turgor normal. No rashes or lesions Lymph nodes: Cervical, supraclavicular, and axillary nodes normal. Neurologic: Grossly normal Normal light touch, normal coordination, normal strength.   Results for orders placed during the hospital encounter of 11/27/11 (from the past 48 hour(s))  BASIC METABOLIC PANEL     Status: Abnormal   Collection Time   11/28/11  5:22 AM      Component Value Range Comment   Sodium 132 (*) 135 - 145 (mEq/L)    Potassium 3.8  3.5 - 5.1 (mEq/L)    Chloride 101  96 - 112 (mEq/L)    CO2 23  19 - 32 (mEq/L)    Glucose, Bld 103 (*) 70 - 99 (mg/dL)    BUN 11  6 - 23 (mg/dL)    Creatinine, Ser 1.19 (*) 0.50 - 1.10 (mg/dL)  Calcium 8.6  8.4 - 10.5 (mg/dL)    GFR calc non Af Amer 40 (*) >90 (mL/min)    GFR calc Af Amer 46 (*) >90 (mL/min)   CBC     Status: Normal   Collection Time   11/28/11  5:22 AM      Component Value Range Comment   WBC 8.5  4.0 - 10.5 (K/uL)    RBC 4.17  3.87 - 5.11 (MIL/uL)    Hemoglobin 12.1  12.0 - 15.0 (g/dL)    HCT 65.7  84.6 - 96.2 (%)    MCV 87.5  78.0 - 100.0 (fL)    MCH 29.0  26.0 - 34.0 (pg)    MCHC 33.2  30.0 - 36.0 (g/dL)    RDW 95.2  84.1 - 32.4 (%)    Platelets 223  150 - 400 (K/uL)   PREGNANCY, URINE     Status: Normal   Collection Time   11/28/11  5:56 AM       Component Value Range Comment   Preg Test, Ur NEGATIVE  NEGATIVE    VANCOMYCIN, RANDOM     Status: Normal   Collection Time   11/28/11  6:55 AM      Component Value Range Comment   Vancomycin Rm 16.9     BASIC METABOLIC PANEL     Status: Abnormal   Collection Time   11/28/11 11:27 PM      Component Value Range Comment   Sodium 134 (*) 135 - 145 (mEq/L)    Potassium 4.1  3.5 - 5.1 (mEq/L)    Chloride 102  96 - 112 (mEq/L)    CO2 25  19 - 32 (mEq/L)    Glucose, Bld 107 (*) 70 - 99 (mg/dL)    BUN 15  6 - 23 (mg/dL)    Creatinine, Ser 4.01 (*) 0.50 - 1.10 (mg/dL)    Calcium 8.9  8.4 - 10.5 (mg/dL)    GFR calc non Af Amer 34 (*) >90 (mL/min)    GFR calc Af Amer 39 (*) >90 (mL/min)   BASIC METABOLIC PANEL     Status: Abnormal   Collection Time   11/29/11  5:15 AM      Component Value Range Comment   Sodium 136  135 - 145 (mEq/L)    Potassium 4.4  3.5 - 5.1 (mEq/L)    Chloride 103  96 - 112 (mEq/L)    CO2 23  19 - 32 (mEq/L)    Glucose, Bld 87  70 - 99 (mg/dL)    BUN 16  6 - 23 (mg/dL)    Creatinine, Ser 0.27 (*) 0.50 - 1.10 (mg/dL)    Calcium 9.1  8.4 - 10.5 (mg/dL)    GFR calc non Af Amer 32 (*) >90 (mL/min)    GFR calc Af Amer 38 (*) >90 (mL/min)   VANCOMYCIN, TROUGH     Status: Normal   Collection Time   11/29/11 10:20 AM      Component Value Range Comment   Vancomycin Tr 11.1  10.0 - 20.0 (ug/mL)   SODIUM, URINE, RANDOM     Status: Normal   Collection Time   11/29/11 12:13 PM      Component Value Range Comment   Sodium, Ur <15     CREATININE, URINE, RANDOM     Status: Normal   Collection Time   11/29/11 12:13 PM      Component Value Range Comment   Creatinine, Urine 109.3  URINALYSIS, ROUTINE W REFLEX MICROSCOPIC     Status: Abnormal   Collection Time   11/29/11 12:13 PM      Component Value Range Comment   Color, Urine YELLOW  YELLOW     APPearance CLEAR  CLEAR     Specific Gravity, Urine 1.015  1.005 - 1.030     pH 6.0  5.0 - 8.0     Glucose, UA NEGATIVE   NEGATIVE (mg/dL)    Hgb urine dipstick NEGATIVE  NEGATIVE     Bilirubin Urine NEGATIVE  NEGATIVE     Ketones, ur NEGATIVE  NEGATIVE (mg/dL)    Protein, ur NEGATIVE  NEGATIVE (mg/dL)    Urobilinogen, UA 0.2  0.0 - 1.0 (mg/dL)    Nitrite NEGATIVE  NEGATIVE     Leukocytes, UA TRACE (*) NEGATIVE    URINE MICROSCOPIC-ADD ON     Status: Abnormal   Collection Time   11/29/11 12:13 PM      Component Value Range Comment   Squamous Epithelial / LPF FEW (*) RARE     WBC, UA 3-6  <3 (WBC/hpf)    Bacteria, UA FEW (*) RARE     Urine-Other MUCOUS PRESENT         Component Value Date/Time   SDES CSF 11/27/2011 1402   SDES CSF 11/27/2011 1402   SPECREQUEST NONE 11/27/2011 1402   SPECREQUEST NONE 11/27/2011 1402   CULT NO GROWTH 1 DAY 11/27/2011 1402   REPTSTATUS PENDING 11/27/2011 1402   REPTSTATUS 11/27/2011 FINAL 11/27/2011 1402   US Renal  11/29/2011  *RADIOLOGY REPORT*  Clinical Data: Acute renal failure.  RENAL/URINARY TRACT ULTRASOUND COMPLETE 11/29/2011:  Comparison:  Visualized upper pole of the kidneys on CTA chest 06/22/2004.  No prior urinary tract imaging.  Findings:  Right Kidney:  No hydronephrosis.  Well-preserved cortex.  No shadowing calculi.  Normal size and parenchymal echotexture without focal abnormalities.  Approximately 12.4 cm in length.  Left Kidney:  No hydronephrosis.  Well-preserved cortex.  No shadowing calculi.  Normal size and parenchymal echotexture without focal abnormalities.  Approximately 11.9 cm in length.  Bladder:  Normal.  IMPRESSION: Normal examination.  Original Report Authenticated By: Arnell Sieving, M.D.    Thank you so much for this interesting consult,   Johny Sax [phone] 11/29/2011, 3:57 PM

## 2011-11-29 NOTE — Progress Notes (Signed)
ANTIBIOTIC CONSULT NOTE - FOLLOW UP  Pharmacy Consult for vancomycin Indication: r/o meningitis   Allergies  Allergen Reactions  . Benadryl Allergy   . Tamiflu     Patient Measurements: Height: 5\' 5"  (165.1 cm) Weight: 209 lb (94.802 kg) IBW/kg (Calculated) : 57  Adjusted Body Weight:   Vital Signs: Temp: 99.7 F (37.6 C) (03/26 2230) Temp src: Oral (03/26 2230) BP: 111/63 mmHg (03/26 2230) Pulse Rate: 92  (03/26 2230) Intake/Output from previous day:   Intake/Output from this shift:    Labs:  Basename 11/28/11 2327 11/28/11 0522 11/27/11 1230  WBC -- 8.5 6.8  HGB -- 12.1 12.7  PLT -- 223 313  LABCREA -- -- --  CREATININE 2.01* 1.74* 0.65   Estimated Creatinine Clearance: 49.1 ml/min (by C-G formula based on Cr of 2.01).  Basename 11/28/11 0655  VANCOTROUGH --  Leodis Binet --  VANCORANDOM 16.9  GENTTROUGH --  GENTPEAK --  GENTRANDOM --  TOBRATROUGH --  Nolen Mu --  TOBRARND --  AMIKACINPEAK --  AMIKACINTROU --  AMIKACIN --     Microbiology: Recent Results (from the past 720 hour(s))  CULTURE, BLOOD (ROUTINE X 2)     Status: Normal (Preliminary result)   Collection Time   11/27/11  1:15 PM      Component Value Range Status Comment   Specimen Description BLOOD LEFT ANTECUBITAL   Final    Special Requests BOTTLES DRAWN AEROBIC AND ANAEROBIC   Final    Culture  Setup Time [id number]   Final    Culture     Final    Value:        BLOOD CULTURE RECEIVED NO GROWTH TO DATE CULTURE WILL BE HELD FOR 5 DAYS BEFORE ISSUING A FINAL NEGATIVE REPORT   Report Status PENDING   Incomplete   CULTURE, BLOOD (ROUTINE X 2)     Status: Normal (Preliminary result)   Collection Time   11/27/11  1:20 PM      Component Value Range Status Comment   Specimen Description BLOOD RIGHT ANTECUBITAL   Final    Special Requests BOTTLES DRAWN AEROBIC AND ANAEROBIC   Final    Culture  Setup Time [id number]   Final    Culture     Final    Value:        BLOOD CULTURE  RECEIVED NO GROWTH TO DATE CULTURE WILL BE HELD FOR 5 DAYS BEFORE ISSUING A FINAL NEGATIVE REPORT   Report Status PENDING   Incomplete   URINE CULTURE     Status: Normal   Collection Time   11/27/11  1:33 PM      Component Value Range Status Comment   Specimen Description URINE, CLEAN CATCH   Final    Special Requests NONE   Final    Culture  Setup Time [id number]   Final    Colony Count >=100,000 COLONIES/ML   Final    Culture     Final    Value: Multiple bacterial morphotypes present, none predominant. Suggest appropriate recollection if clinically indicated.   Report Status 11/28/2011 FINAL   Final   CSF CULTURE     Status: Normal (Preliminary result)   Collection Time   11/27/11  2:02 PM      Component Value Range Status Comment   Specimen Description CSF   Final    Special Requests NONE   Final    Gram Stain     Final    Value: CYTOSPIN WBC PRESENT,BOTH PMN  AND MONONUCLEAR     NO ORGANISMS SEEN   Culture NO GROWTH   Final    Report Status PENDING   Incomplete   GRAM STAIN     Status: Normal   Collection Time   11/27/11  2:02 PM      Component Value Range Status Comment   Specimen Description CSF   Final    Special Requests NONE   Final    Gram Stain     Final    Value: WBC PRESENT,BOTH PMN AND MONONUCLEAR     NO ORGANISMS SEEN     RESULTS CALLED TO F. HAZ RN AT 1505 ON [id number] BY SHUEA   Report Status 11/27/2011 FINAL   Final     Anti-infectives     Start     Dose/Rate Route Frequency Ordered Stop   11/28/11 2215   vancomycin (VANCOCIN) IVPB 1000 mg/200 mL premix        1,000 mg 200 mL/hr over 60 Minutes Intravenous  Once 11/28/11 2203 11/28/11 2348   11/28/11 1000   vancomycin (VANCOCIN) IVPB 1000 mg/200 mL premix        1,000 mg 200 mL/hr over 60 Minutes Intravenous  Once 11/28/11 0814     11/28/11 0600   cefTRIAXone (ROCEPHIN) 2 g in dextrose 5 % 50 mL IVPB        2 g 100 mL/hr over 30 Minutes Intravenous Every 12 hours 11/27/11 1822     11/28/11 0000    vancomycin (VANCOCIN) IVPB 1000 mg/200 mL premix  Status:  Discontinued        1,000 mg 200 mL/hr over 60 Minutes Intravenous Every 8 hours 11/27/11 1902 11/28/11 0631   11/27/11 1600   cefTRIAXone (ROCEPHIN) 2 g in dextrose 5 % 50 mL IVPB  Status:  Discontinued        2 g 100 mL/hr over 30 Minutes Intravenous Every 24 hours 11/27/11 1504 11/27/11 1822   11/27/11 1600   vancomycin (VANCOCIN) IVPB 1000 mg/200 mL premix        1,000 mg 200 mL/hr over 60 Minutes Intravenous  Once 11/27/11 1504 11/27/11 1650   11/27/11 1530   acyclovir (ZOVIRAX) 1,140 mg in dextrose 5 % 250 mL IVPB        20 mg/kg  57 kg (Ideal) 272.8 mL/hr over 60 Minutes Intravenous  Once 11/27/11 1504 11/27/11 1919          Assessment: Patient with r/o meningitis and vancomycin per pharmacy ordered.  Patient on Vancomycin and SCr bumped up.  Checked level 3/26 am 16.9 (at goal).  One dose of vancomycin 1gm order for 3/26 am after level resulted to maintain patient at goal.  However, vancomycin dose not given for unknown reason (none documented).  Gave vancomycin 1gm iv after discussion with RN at night confirmed that day RN did not give 3/26 am dose (reported this to her) and was not a case of not charting dose.    Goal of Therapy:  Vancomycin trough level 15-20 mcg/ml  Plan:  Will now check level 12hr after last dose.  Feel will need to dose per levels as missing dose will falsely lower level from SS dosing and increase in SCr (again today) will raise levels.  Will plan for daily levels to determine need to redose.  When level is below 20, will redose with 1gm and repeat process.   Darlina Guys, Jacquenette Shone Crowford 11/29/2011,6:22 AM

## 2011-11-30 LAB — BASIC METABOLIC PANEL
BUN: 15 mg/dL (ref 6–23)
CO2: 24 mEq/L (ref 19–32)
Calcium: 8.6 mg/dL (ref 8.4–10.5)
Chloride: 108 mEq/L (ref 96–112)
Creatinine, Ser: 1.5 mg/dL — ABNORMAL HIGH (ref 0.50–1.10)
GFR calc Af Amer: 55 mL/min — ABNORMAL LOW (ref >90)
GFR calc non Af Amer: 48 mL/min — ABNORMAL LOW (ref >90)
Glucose, Bld: 81 mg/dL (ref 70–99)
Potassium: 4.2 mEq/L (ref 3.5–5.1)
Sodium: 139 mEq/L (ref 135–145)

## 2011-11-30 LAB — CBC
HCT: 37.7 % (ref 36.0–46.0)
Hemoglobin: 12.3 g/dL (ref 12.0–15.0)
MCH: 28.5 pg (ref 26.0–34.0)
MCHC: 32.6 g/dL (ref 30.0–36.0)
MCV: 87.5 fL (ref 78.0–100.0)
Platelets: 245 10*3/uL (ref 150–400)
RBC: 4.31 MIL/uL (ref 3.87–5.11)
RDW: 12.2 % (ref 11.5–15.5)
WBC: 6.8 10*3/uL (ref 4.0–10.5)

## 2011-11-30 LAB — URINE CULTURE
Colony Count: NO GROWTH
Culture  Setup Time: 201303280306
Culture: NO GROWTH

## 2011-11-30 LAB — DIFFERENTIAL
Basophils Absolute: 0 10*3/uL (ref 0.0–0.1)
Basophils Relative: 0 % (ref 0–1)
Eosinophils Absolute: 0.1 10*3/uL (ref 0.0–0.7)
Eosinophils Relative: 2 % (ref 0–5)
Lymphocytes Relative: 30 % (ref 12–46)
Lymphs Abs: 2.1 10*3/uL (ref 0.7–4.0)
Monocytes Absolute: 0.5 10*3/uL (ref 0.1–1.0)
Monocytes Relative: 8 % (ref 3–12)
Neutro Abs: 4.1 10*3/uL (ref 1.7–7.7)
Neutrophils Relative %: 60 % (ref 43–77)

## 2011-11-30 LAB — HERPES SIMPLEX VIRUS(HSV) DNA BY PCR
HSV 1 DNA: NOT DETECTED
HSV 2 DNA: NOT DETECTED

## 2011-11-30 NOTE — Progress Notes (Signed)
INFECTIOUS DISEASE PROGRESS NOTE  ID: Tasha Hansen is a 26 y.o. female with  Principal Problem:  *Meningitis, aseptic Active Problems:  ARF (acute renal failure)  Subjective: Mild headache, attributes to LP  Abtx:  Anti-infectives     Start     Dose/Rate Route Frequency Ordered Stop   11/29/11 1230   vancomycin (VANCOCIN) IVPB 1000 mg/200 mL premix  Status:  Discontinued        1,000 mg 200 mL/hr over 60 Minutes Intravenous STAT 11/29/11 1222 11/29/11 1355   11/28/11 2215   vancomycin (VANCOCIN) IVPB 1000 mg/200 mL premix        1,000 mg 200 mL/hr over 60 Minutes Intravenous  Once 11/28/11 2203 11/28/11 2348   11/28/11 1000   vancomycin (VANCOCIN) IVPB 1000 mg/200 mL premix  Status:  Discontinued        1,000 mg 200 mL/hr over 60 Minutes Intravenous  Once 11/28/11 0814 11/29/11 1355   11/28/11 0600   cefTRIAXone (ROCEPHIN) 2 g in dextrose 5 % 50 mL IVPB        2 g 100 mL/hr over 30 Minutes Intravenous Every 12 hours 11/27/11 1822     11/28/11 0000   vancomycin (VANCOCIN) IVPB 1000 mg/200 mL premix  Status:  Discontinued        1,000 mg 200 mL/hr over 60 Minutes Intravenous Every 8 hours 11/27/11 1902 11/28/11 0631   11/27/11 1600   cefTRIAXone (ROCEPHIN) 2 g in dextrose 5 % 50 mL IVPB  Status:  Discontinued        2 g 100 mL/hr over 30 Minutes Intravenous Every 24 hours 11/27/11 1504 11/27/11 1822   11/27/11 1600   vancomycin (VANCOCIN) IVPB 1000 mg/200 mL premix        1,000 mg 200 mL/hr over 60 Minutes Intravenous  Once 11/27/11 1504 11/27/11 1650   11/27/11 1530   acyclovir (ZOVIRAX) 1,140 mg in dextrose 5 % 250 mL IVPB        20 mg/kg  57 kg (Ideal) 272.8 mL/hr over 60 Minutes Intravenous  Once 11/27/11 1504 11/27/11 1919          Medications:  Scheduled:   . cefTRIAXone (ROCEPHIN)  IV  2 g Intravenous Q12H  . loratadine  10 mg Oral Daily  . Norethindrone Acetate-Ethinyl Estrad-FE  1 tablet Oral Daily  . sodium chloride  1,000 mL Intravenous Once  .  sodium chloride  3 mL Intravenous Q12H    Objective: Vital signs in last 24 hours: Temp:  [97.6 F (36.4 C)-98.6 F (37 C)] 98 F (36.7 C) (03/28 1342) Pulse Rate:  [86-96] 96  (03/28 1342) Resp:  [20] 20  (03/28 1342) BP: (113-124)/(68-87) 116/72 mmHg (03/28 1342) SpO2:  [100 %] 100 % (03/28 1342)   General appearance: alert, cooperative, appears stated age and no distress Eyes: negative findings: conjunctivae and sclerae normal and pupils equal, round, reactive to light and accomodation Throat: lips, mucosa, and tongue normal; teeth and gums normal Neck: no adenopathy and FROM, no meningismus Resp: clear to auscultation bilaterally Cardio: regular rate and rhythm GI: normal findings: bowel sounds normal and soft, non-tender  Lab Results  Basename 11/30/11 0541 11/29/11 0515 11/28/11 0522  WBC 6.8 -- 8.5  HGB 12.3 -- 12.1  HCT 37.7 -- 36.5  NA 139 136 --  K 4.2 4.4 --  CL 108 103 --  CO2 24 23 --  BUN 15 16 --  CREATININE 1.50* 2.07* --  GLU -- -- --  Liver Panel No results found for this basename: PROT:2,ALBUMIN:2,AST:2,ALT:2,ALKPHOS:2,BILITOT:2,BILIDIR:2,IBILI:2 in the last 72 hours Sedimentation Rate No results found for this basename: ESRSEDRATE in the last 72 hours C-Reactive Protein No results found for this basename: CRP:2 in the last 72 hours  Microbiology: Recent Results (from the past 240 hour(s))  CULTURE, BLOOD (ROUTINE X 2)     Status: Normal (Preliminary result)   Collection Time   11/27/11  1:15 PM      Component Value Range Status Comment   Specimen Description BLOOD LEFT ANTECUBITAL   Final    Special Requests BOTTLES DRAWN AEROBIC AND ANAEROBIC   Final    Culture  Setup Time [id number]   Final    Culture     Final    Value:        BLOOD CULTURE RECEIVED NO GROWTH TO DATE CULTURE WILL BE HELD FOR 5 DAYS BEFORE ISSUING A FINAL NEGATIVE REPORT   Report Status PENDING   Incomplete   CULTURE, BLOOD (ROUTINE X 2)     Status: Normal  (Preliminary result)   Collection Time   11/27/11  1:20 PM      Component Value Range Status Comment   Specimen Description BLOOD RIGHT ANTECUBITAL   Final    Special Requests BOTTLES DRAWN AEROBIC AND ANAEROBIC   Final    Culture  Setup Time [id number]   Final    Culture     Final    Value:        BLOOD CULTURE RECEIVED NO GROWTH TO DATE CULTURE WILL BE HELD FOR 5 DAYS BEFORE ISSUING A FINAL NEGATIVE REPORT   Report Status PENDING   Incomplete   URINE CULTURE     Status: Normal   Collection Time   11/27/11  1:33 PM      Component Value Range Status Comment   Specimen Description URINE, CLEAN CATCH   Final    Special Requests NONE   Final    Culture  Setup Time [id number]   Final    Colony Count >=100,000 COLONIES/ML   Final    Culture     Final    Value: Multiple bacterial morphotypes present, none predominant. Suggest appropriate recollection if clinically indicated.   Report Status 11/28/2011 FINAL   Final   CSF CULTURE     Status: Normal (Preliminary result)   Collection Time   11/27/11  2:02 PM      Component Value Range Status Comment   Specimen Description CSF   Final    Special Requests NONE   Final    Gram Stain     Final    Value: CYTOSPIN WBC PRESENT,BOTH PMN AND MONONUCLEAR     NO ORGANISMS SEEN   Culture NO GROWTH 2 DAYS   Final    Report Status PENDING   Incomplete   GRAM STAIN     Status: Normal   Collection Time   11/27/11  2:02 PM      Component Value Range Status Comment   Specimen Description CSF   Final    Special Requests NONE   Final    Gram Stain     Final    Value: WBC PRESENT,BOTH PMN AND MONONUCLEAR     NO ORGANISMS SEEN     RESULTS CALLED TO F. HAZ RN AT 1505 ON [id number] BY SHUEA   Report Status 11/27/2011 FINAL   Final     Studies/Results: US Renal  11/29/2011  *RADIOLOGY REPORT*  Clinical Data: Acute  renal failure.  RENAL/URINARY TRACT ULTRASOUND COMPLETE 11/29/2011:  Comparison:  Visualized upper pole of the kidneys on CTA chest  06/22/2004.  No prior urinary tract imaging.  Findings:  Right Kidney:  No hydronephrosis.  Well-preserved cortex.  No shadowing calculi.  Normal size and parenchymal echotexture without focal abnormalities.  Approximately 12.4 cm in length.  Left Kidney:  No hydronephrosis.  Well-preserved cortex.  No shadowing calculi.  Normal size and parenchymal echotexture without focal abnormalities.  Approximately 11.9 cm in length.  Bladder:  Normal.  IMPRESSION: Normal examination.  Original Report Authenticated By: Arnell Sieving, M.D.     Assessment/Plan: Aseptic Meningitis  Acute Renal Failure Day 4 ceftriaxone Cr is improving, ? Etiology, drug. Her u/s is nl.  RPR is negative. HIV PCR, CSF studies (routine Cx, HSV PCR, Enterovirus PCR) pending,  Most likely home tomorrow when studies resulted.   Johny Sax Infectious Diseases [phone] 11/30/2011, 2:38 PM

## 2011-11-30 NOTE — Progress Notes (Signed)
PROGRESS NOTE  Tasha Hansen ZOX:[id number] DOB: 24-Dec-1985 DOA: 11/27/2011 PCP: Ancil Boozer, MD, MD  Brief narrative: 26 year old woman with no significant past medical history who presented with a history of fever, headache and neck pain. Lumbar puncture was performed and was notable for leukocytosis but Gram stain was negative and admitted to rule out bacterial meningitis.  Past medical history: None  Consultants:  None  Procedures:  March 25: Lumbar puncture  Antibiotics:  March 25: Vancomycin  March 25: Rocephin  Interim History: Interval documentation reviewed. Febrile to 102.9 2  Days ago. Vital signs otherwise stable. Acute renal failure. Blood cultures no growth to date.  Subjective: Feels well now. Denies headache. No neck pain. No complaints. Good urine output.  Objective: Filed Vitals:   11/29/11 0530 11/29/11 1421 11/29/11 2156 11/30/11 0600  BP: 114/68 114/74 124/87 113/68  Pulse: 98 97 86 90  Temp: 97.4 F (36.3 C) 98.6 F (37 C) 98.6 F (37 C) 97.6 F (36.4 C)  TempSrc: Oral Oral Oral Oral  Resp: 20 18 20 20   Height:      Weight:      SpO2: 99% 100% 100% 100%    Intake/Output Summary (Last 24 hours) at 11/30/11 1310 Last data filed at 11/30/11 1300  Gross per 24 hour  Intake   4559 ml  Output   3275 ml  Net   1284 ml    Exam:   General:  Well-appearing.  Neck: Full range of movement without pain.  Cardiovascular: Regular rate and rhythm. No murmur, rub, gallop. No lower extremity edema.  Respiratory: Clear to auscultation bilaterally. No wheezes, rales, rhonchi. Normal respiratory effort.  Psychiatric: Grossly normal mood and affect. Speech fluent and appropriate.  Data Reviewed: Basic Metabolic Panel:  Lab 11/30/11 8119 11/29/11 0515 11/28/11 2327 11/28/11 0522 11/27/11 1230  NA 139 136 134* 132* 132*  K 4.2 4.4 -- -- --  CL 108 103 102 101 102  CO2 24 23 25 23 21   GLUCOSE 81 87 107* 103* 82  BUN 15 16 15 11 7   CREATININE  1.50* 2.07* 2.01* 1.74* 0.65  CALCIUM 8.6 9.1 8.9 8.6 9.0  MG -- -- -- -- --  PHOS -- -- -- -- --   CBC:  Lab 11/30/11 0541 11/28/11 0522 11/27/11 1230  WBC 6.8 8.5 6.8  NEUTROABS 4.1 -- 4.1  HGB 12.3 12.1 12.7  HCT 37.7 36.5 37.4  MCV 87.5 87.5 86.4  PLT 245 223 313   Recent Results (from the past 240 hour(s))  CULTURE, BLOOD (ROUTINE X 2)     Status: Normal (Preliminary result)   Collection Time   11/27/11  1:15 PM      Component Value Range Status Comment   Specimen Description BLOOD LEFT ANTECUBITAL   Final    Special Requests BOTTLES DRAWN AEROBIC AND ANAEROBIC   Final    Culture  Setup Time [id number]   Final    Culture     Final    Value:        BLOOD CULTURE RECEIVED NO GROWTH TO DATE CULTURE WILL BE HELD FOR 5 DAYS BEFORE ISSUING A FINAL NEGATIVE REPORT   Report Status PENDING   Incomplete   CULTURE, BLOOD (ROUTINE X 2)     Status: Normal (Preliminary result)   Collection Time   11/27/11  1:20 PM      Component Value Range Status Comment   Specimen Description BLOOD RIGHT ANTECUBITAL   Final    Special Requests BOTTLES  DRAWN AEROBIC AND ANAEROBIC   Final    Culture  Setup Time [id number]   Final    Culture     Final    Value:        BLOOD CULTURE RECEIVED NO GROWTH TO DATE CULTURE WILL BE HELD FOR 5 DAYS BEFORE ISSUING A FINAL NEGATIVE REPORT   Report Status PENDING   Incomplete   URINE CULTURE     Status: Normal   Collection Time   11/27/11  1:33 PM      Component Value Range Status Comment   Specimen Description URINE, CLEAN CATCH   Final    Special Requests NONE   Final    Culture  Setup Time [id number]   Final    Colony Count >=100,000 COLONIES/ML   Final    Culture     Final    Value: Multiple bacterial morphotypes present, none predominant. Suggest appropriate recollection if clinically indicated.   Report Status 11/28/2011 FINAL   Final   CSF CULTURE     Status: Normal (Preliminary result)   Collection Time   11/27/11  2:02 PM      Component  Value Range Status Comment   Specimen Description CSF   Final    Special Requests NONE   Final    Gram Stain     Final    Value: CYTOSPIN WBC PRESENT,BOTH PMN AND MONONUCLEAR     NO ORGANISMS SEEN   Culture NO GROWTH 2 DAYS   Final    Report Status PENDING   Incomplete   GRAM STAIN     Status: Normal   Collection Time   11/27/11  2:02 PM      Component Value Range Status Comment   Specimen Description CSF   Final    Special Requests NONE   Final    Gram Stain     Final    Value: WBC PRESENT,BOTH PMN AND MONONUCLEAR     NO ORGANISMS SEEN     RESULTS CALLED TO F. HAZ RN AT 1505 ON [id number] BY SHUEA   Report Status 11/27/2011 FINAL   Final     Studies:  Scheduled Meds:    . cefTRIAXone (ROCEPHIN)  IV  2 g Intravenous Q12H  . loratadine  10 mg Oral Daily  . Norethindrone Acetate-Ethinyl Estrad-FE  1 tablet Oral Daily  . sodium chloride  1,000 mL Intravenous Once  . sodium chloride  3 mL Intravenous Q12H  . DISCONTD: vancomycin  1,000 mg Intravenous Once  . DISCONTD: vancomycin  1,000 mg Intravenous STAT   Continuous Infusions:    . sodium chloride 150 mL/hr at 11/29/11 1500    Assessment/Plan: 1. Fever, headache, neck pain: Plan as below. 2. Possible aseptic meningitis: Favor a viral process based on chronicity of symptoms and clinical presentation. Mentation is normal in exam is unremarkable. CSF culture no growth today. Gram stain negative.Continue Rocephin.  HSV PCR and enterovirus PCR to CSF pending. ID ff and appreciate input and rxcs.  3. Acute renal failure:. Likely prerenal azotemia as urine sodium 15 <. Also Possibly secondary to  Acyclovir and nsaids and possibly vancomycin.  Acyclovir d/c 3 days ago.Vancomycin D/C. Good urine output. Renal function improving with IVF. Follow.  Repeat basic metabolic panel the morning.  Discussed with patient and mother bedside. All questions answered to their apparent satisfaction.   Code Status: Full code  Family Communication: As  above  Disposition Plan: Home when improved    Ramiro Harvest MD  [facility] Pager [phone] 11/30/2011, 1:10 PM    LOS: 3 days

## 2011-12-01 LAB — CSF CULTURE: Culture: NO GROWTH

## 2011-12-01 LAB — BASIC METABOLIC PANEL
BUN: 11 mg/dL (ref 6–23)
CO2: 21 mEq/L (ref 19–32)
Calcium: 8.5 mg/dL (ref 8.4–10.5)
Chloride: 107 mEq/L (ref 96–112)
Creatinine, Ser: 1.17 mg/dL — ABNORMAL HIGH (ref 0.50–1.10)
GFR calc Af Amer: 75 mL/min — ABNORMAL LOW (ref >90)
GFR calc non Af Amer: 65 mL/min — ABNORMAL LOW (ref >90)
Glucose, Bld: 86 mg/dL (ref 70–99)
Potassium: 3.8 mEq/L (ref 3.5–5.1)
Sodium: 135 mEq/L (ref 135–145)

## 2011-12-01 LAB — CSF CULTURE W GRAM STAIN

## 2011-12-01 NOTE — Progress Notes (Signed)
Spoke with pt and mother at bedside concerning discharge needs.  There are no Home Health needs at present time, pt is ready for discharge. Pt, mother under that pt will need to follow up with PCP and ID. mp

## 2011-12-01 NOTE — Discharge Summary (Signed)
Discharge Summary  Tasha Hansen MR#: [id number]  DOB:1985-11-19  Date of Admission: 11/27/2011 Date of Discharge: 12/01/2011  Patient's PCP: Ancil Boozer, MD, MD  Attending Physician:Casia Corti  Consults:   Infectious diseases: Dr. Johny Sax.  Discharge Diagnoses: Meningitis, aseptic Acute Renal Failure Present on Admission:  .Meningitis, aseptic   Brief Admitting History and Physical 26 year old woman with no significant past medical history who presents with a history of fever, headache and neck pain. Headache began 5 days ago described as being whole head and constant in nature. Patient felt poorly but had nonspecific symptoms until the day when she developed fever and neck pain. She was referred by her primary care physician to the emergency department for further evaluation to exclude meningitis. She had a coworker who was sick with an unspecified illness but did not require hospitalization.  In emergency department the patient was noted to be afebrile and vital signs were stable. Chest x-ray and urinalysis were unremarkable. Lumbar puncture was performed and was notable for leukocytosis but Gram stain was negative. She was given vancomycin, Rocephin and acyclovir and referred for admission.  Review of Systems:  Negative for changes to her vision, sore throat, rash, muscle aches, chest pain, shortness of breath, dysuria, bleeding, nausea, vomiting, abdominal pain, diarrhea.  Positive for an episode of diarrhea today, minimal photophobia For the rest of the admission history and physical please see H&P dictated by Dr. Irene Limbo.  Discharge Medications Medication List  As of 12/01/2011  3:25 PM   CONTINUE taking these medications         acetaminophen 500 MG tablet   Commonly known as: TYLENOL      Biotin 5000 MCG Caps      LOESTRIN 24 FE 1-20 MG-MCG(24) tablet   Generic drug: Norethindrone Acetate-Ethinyl Estrad-FE      loratadine 10 MG tablet   Commonly known  as: CLARITIN      mulitivitamin with minerals Tabs      tetrahydrozoline 0.05 % ophthalmic solution           Hospital Course: Meningitis, aseptic Patient was admitted with meningitis as the cause of her fever headache and neck pain. The differential included a viral process due to the chronicity of the symptoms and the clinical presentation versus bacterial. Lumbar puncture which was done had a negative Gram stain and culture results were negative. Patient was placed on IV Rocephin as well as IV vancomycin and was given a dose of IV acyclovir and supportive care. Patient was monitored and followed. Patient improved clinically on a daily basis. IV acyclovir was not resumed as at that time it was felt that the probability of a herpes encephalitis or meningitis was low. Patient was continued on IV Rocephin and vancomycin and monitored. Patient's headaches improved, and nuchal rigidity resolved and she became afebrile. An ID consultation was obtained and patient was seen in consultation by Dr. Johny Sax of infectious diseases. A HSV DNA PCR and enterovirus PCR was added to the CSF fluids. Patient's IV vancomycin was discontinued secondary to worsening renal function. Patient was maintained on IV Rocephin while culture results are pending. HSV DNA PCR came back negative. Patient did not have any further fevers or nuchal rigidity. Patient will be discharged in stable and improved condition and will followup in 2 weeks with Dr. Drue Second of infectious diseases.  #2 acute renal failure During the hospitalization patient was noted to have an acute increase in her creatinine. Patient was noted to go in acute renal failure with a  creatinine going up as high as 2.07. Patient's IV vancomycin was discontinued. A urine sodium was obtained which was less than 15. It was felt that patient's acute renal failure was likely secondary to prerenal azotemia secondary to dehydration in the setting of IV vancomycin and  acyclovir. Patient was given 1 L bolus of IV fluids and put her on normal saline 150 cc per hour for approximately 48 hours. Patient's renal function improved on a daily basis such that by day of discharge patient's creatinine was down to 1.17. Patient will followup with PCP one week post discharge and will need a repeat basic metabolic profile done to followup on her renal function and electrolytes. She'll be discharged in stable and improved condition.  Present on Admission:  .Meningitis, aseptic   Day of Discharge BP 129/89  Pulse 82  Temp(Src) 98.2 F (36.8 C) (Oral)  Resp 18  Ht 5\' 5"  (1.651 m)  Wt 94.802 kg (209 lb)  BMI 34.78 kg/m2  SpO2 100%  LMP 11/19/2011 Subjective: Patient denies any headaches, no nuchal rigidity. Good urine output. General: Alert, awake, oriented x3, in no acute distress. HEENT: No bruits, no goiter. Heart: Regular rate and rhythm, without murmurs, rubs, gallops. Lungs: Clear to auscultation bilaterally. Abdomen: Soft, nontender, nondistended, positive bowel sounds. Extremities: No clubbing cyanosis or edema with positive pedal pulses.   Results for orders placed during the hospital encounter of 11/27/11 (from the past 48 hour(s))  RPR     Status: Normal   Collection Time   11/29/11  4:32 PM      Component Value Range Comment   RPR NON REACTIVE  NON REACTIVE    HERPES SIMPLEX VIRUS(HSV) DNA BY PCR     Status: Normal   Collection Time   11/29/11  4:56 PM      Component Value Range Comment   Specimen source hsv CSF      HSV 1 DNA Not Detected  Not Detected     HSV 2 DNA Not Detected  Not Detected    CBC     Status: Normal   Collection Time   11/30/11  5:41 AM      Component Value Range Comment   WBC 6.8  4.0 - 10.5 (K/uL)    RBC 4.31  3.87 - 5.11 (MIL/uL)    Hemoglobin 12.3  12.0 - 15.0 (g/dL)    HCT 16.1  09.6 - 04.5 (%)    MCV 87.5  78.0 - 100.0 (fL)    MCH 28.5  26.0 - 34.0 (pg)    MCHC 32.6  30.0 - 36.0 (g/dL)    RDW 40.9  81.1 - 91.4 (%)     Platelets 245  150 - 400 (K/uL)   BASIC METABOLIC PANEL     Status: Abnormal   Collection Time   11/30/11  5:41 AM      Component Value Range Comment   Sodium 139  135 - 145 (mEq/L)    Potassium 4.2  3.5 - 5.1 (mEq/L)    Chloride 108  96 - 112 (mEq/L)    CO2 24  19 - 32 (mEq/L)    Glucose, Bld 81  70 - 99 (mg/dL)    BUN 15  6 - 23 (mg/dL)    Creatinine, Ser 7.82 (*) 0.50 - 1.10 (mg/dL)    Calcium 8.6  8.4 - 10.5 (mg/dL)    GFR calc non Af Amer 48 (*) >90 (mL/min)    GFR calc Af Amer 55 (*) >90 (mL/min)  DIFFERENTIAL     Status: Normal   Collection Time   11/30/11  5:41 AM      Component Value Range Comment   Neutrophils Relative 60  43 - 77 (%)    Neutro Abs 4.1  1.7 - 7.7 (K/uL)    Lymphocytes Relative 30  12 - 46 (%)    Lymphs Abs 2.1  0.7 - 4.0 (K/uL)    Monocytes Relative 8  3 - 12 (%)    Monocytes Absolute 0.5  0.1 - 1.0 (K/uL)    Eosinophils Relative 2  0 - 5 (%)    Eosinophils Absolute 0.1  0.0 - 0.7 (K/uL)    Basophils Relative 0  0 - 1 (%)    Basophils Absolute 0.0  0.0 - 0.1 (K/uL)   BASIC METABOLIC PANEL     Status: Abnormal   Collection Time   12/01/11  5:27 AM      Component Value Range Comment   Sodium 135  135 - 145 (mEq/L)    Potassium 3.8  3.5 - 5.1 (mEq/L)    Chloride 107  96 - 112 (mEq/L)    CO2 21  19 - 32 (mEq/L)    Glucose, Bld 86  70 - 99 (mg/dL)    BUN 11  6 - 23 (mg/dL)    Creatinine, Ser 1.61 (*) 0.50 - 1.10 (mg/dL)    Calcium 8.5  8.4 - 10.5 (mg/dL)    GFR calc non Af Amer 65 (*) >90 (mL/min)    GFR calc Af Amer 75 (*) >90 (mL/min)     Dg Chest 2 View  11/27/2011  *RADIOLOGY REPORT*  Clinical Data: Headache and fever.  CHEST - 2 VIEW  Comparison: 06/22/2004.  Findings: The cardiac silhouette, mediastinal and hilar contours are within normal limits and stable. The lungs are clear.  No pleural effusions.  The bony thorax is intact.  IMPRESSION: No acute cardiopulmonary findings.  Original Report Authenticated By: P. Loralie Champagne, M.D.   US  Renal  11/29/2011  *RADIOLOGY REPORT*  Clinical Data: Acute renal failure.  RENAL/URINARY TRACT ULTRASOUND COMPLETE 11/29/2011:  Comparison:  Visualized upper pole of the kidneys on CTA chest 06/22/2004.  No prior urinary tract imaging.  Findings:  Right Kidney:  No hydronephrosis.  Well-preserved cortex.  No shadowing calculi.  Normal size and parenchymal echotexture without focal abnormalities.  Approximately 12.4 cm in length.  Left Kidney:  No hydronephrosis.  Well-preserved cortex.  No shadowing calculi.  Normal size and parenchymal echotexture without focal abnormalities.  Approximately 11.9 cm in length.  Bladder:  Normal.  IMPRESSION: Normal examination.  Original Report Authenticated By: Arnell Sieving, M.D.     Disposition: Home.  Diet: Regular.  Activity: Increase activity slowly   Follow-up Appts: Discharge Orders    Future Orders Please Complete By Expires   Diet general      Increase activity slowly      Discharge instructions      Comments:   Follow up with ALM,STEPHANIE, MD, in 1 week Follow up with Dr Drue Second, infectious diseases in 2 weeks, office will call you with appointment.       TESTS THAT NEED FOLLOW-UP BMET in one week post discharge home follow up with PCP to monitor renal function.  Time spent on discharge, talking to the patient, and coordinating care: 50 mins.   SignedRamiro Harvest 12/01/2011, 3:25 PM

## 2011-12-03 LAB — CULTURE, BLOOD (ROUTINE X 2)
Culture  Setup Time: 201303251639
Culture  Setup Time: 201303251639
Culture: NO GROWTH
Culture: NO GROWTH

## 2011-12-04 LAB — HIV-1 RNA QUANT-NO REFLEX-BLD
HIV 1 RNA Quant: <20 copies/mL (ref <20)
HIV-1 RNA Quant, Log: <1.3 {Log} (ref <1.30)

## 2011-12-04 LAB — ENTEROVIRUS PCR: Enterovirus PCR: NOT DETECTED

## 2011-12-21 ENCOUNTER — Encounter: Payer: Self-pay | Admitting: Internal Medicine

## 2011-12-21 ENCOUNTER — Ambulatory Visit (INDEPENDENT_AMBULATORY_CARE_PROVIDER_SITE_OTHER): Payer: BC Managed Care – PPO | Admitting: Internal Medicine

## 2011-12-21 VITALS — BP 106/74 | HR 91 | Temp 98.4°F | Ht 65.0 in | Wt 205.0 lb

## 2011-12-21 DIAGNOSIS — G030 Nonpyogenic meningitis: Secondary | ICD-10-CM

## 2011-12-21 DIAGNOSIS — A879 Viral meningitis, unspecified: Secondary | ICD-10-CM

## 2011-12-21 NOTE — Progress Notes (Signed)
INFECTIOUS DISEASES CLINIC  RFV = ED follow up for aseptic meningitis  Subjective:    Patient ID: Tasha Hansen, female    DOB: 1986/05/21, 26 y.o.   MRN: [id number]  HPI Ms. Luan Pulling is a 26yo F who does not have significant past medical history. She presented to ED on 3/27 with fevers, nuchal rigidity, headache, photophobia c/w meningitis. She was empirically started on tx for bacterial meningitis and underwent LP, however CSF c/w aseptic meningitis. HSV and PCR negative. She was refer to the ID clinic for follow up. Incidentally, HIV VL was tested, which was negative, but patient has no history of HIV diagnosis. She was tested negative for HIV in Feb 2013 by her PCP, as she and her boyfriend were tested/being in a monogamous relationship.   She presents with her mother today to ID clinic for follow up on her aseptic meningitis, unclear why HIV VL was tested. She has fully recovered from her aseptic meningitis episode  Prior to Admission medications   Medication Sig Start Date End Date Taking? Authorizing Provider  acetaminophen (TYLENOL) 500 MG tablet Take 1,000 mg by mouth every 6 (six) hours as needed.   Yes Historical Provider, MD  loratadine (CLARITIN) 10 MG tablet Take 10 mg by mouth daily.   Yes Historical Provider, MD  Multiple Vitamin (MULITIVITAMIN WITH MINERALS) TABS Take 1 tablet by mouth daily.   Yes Historical Provider, MD  norethindrone-ethinyl estradiol (LOESTRIN 1/20, 21,) 1-20 MG-MCG tablet Take 1 tablet by mouth daily.   Yes Historical Provider, MD  Biotin 5000 MCG CAPS Take 1 capsule by mouth daily.    Historical Provider, MD  Norethindrone Acetate-Ethinyl Estrad-FE (LOESTRIN 24 FE) 1-20 MG-MCG(24) tablet Take 1 tablet by mouth daily.    Historical Provider, MD  tetrahydrozoline 0.05 % ophthalmic solution Place 1 drop into both eyes 2 (two) times daily.    Historical Provider, MD   Active Ambulatory Problems    Diagnosis Date Noted  . Meningitis, aseptic 11/29/2011  . ARF  (acute renal failure) 11/29/2011   Resolved Ambulatory Problems    Diagnosis Date Noted  . No Resolved Ambulatory Problems   Past Medical History  Diagnosis Date  . No pertinent past medical history    History   Social History  . Marital Status: Single    Spouse Name: N/A    Number of Children: N/A  . Years of Education: N/A   Social History Main Topics  . Smoking status: Never Smoker   . Smokeless tobacco: Never Used  . Alcohol Use: No  . Drug Use: No  . Sexually Active:    Other Topics Concern  . None   Social History Narrative  . 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 She is in a monogamous relationship  family history is not on file. Review of Systems  Constitutional: Negative for fever, chills, diaphoresis, activity change, appetite change, fatigue and unexpected weight change.  HENT: Negative for congestion, sore throat, rhinorrhea, sneezing, trouble swallowing and sinus pressure.  Eyes: Negative for photophobia and visual disturbance.  Respiratory: Negative for cough, chest tightness, shortness of breath, wheezing and stridor.  Cardiovascular: Negative for chest pain, palpitations and leg swelling.  Gastrointestinal: Negative for nausea, vomiting, abdominal pain, diarrhea, constipation, blood in stool, abdominal distention and anal bleeding.  Genitourinary: Negative for dysuria, hematuria, flank pain and difficulty urinating.  Musculoskeletal: Negative for myalgias, back pain, joint swelling, arthralgias and gait problem.  Skin:  Negative for color change, pallor, rash and wound.  Neurological: Negative for dizziness, tremors, weakness and light-headedness.  Hematological: Negative for adenopathy. Does not bruise/bleed easily.  Psychiatric/Behavioral: Negative for behavioral problems, confusion, sleep disturbance, dysphoric mood, decreased concentration and agitation.       Objective:   Physical Exam gen = a xo by  4, in NAD. Healthy appearing 21 something female. HEENT = /AT, perrla, eomi, no scleral icterus, MMM, OP clear, good dentition, TM clear bilaterally Neck =supple, no LAD, no nuchal rigidity Pulm= CTAB, no w/c/r Cors= nl s1,s2, no g/m/r abd soft, ntnd, bs+ Ext= no c/c/e Neuro= cn 2-12 intact, motoro 5/5 grossly in all extremities, sensation intact       Assessment & Plan:  Aseptic meningitis = resolved. Patient asked if this would happen to her again, and I mentioned that it is difficult to predict who is to get it and can possibly have it again.  hiv testing = patient resently tested with her current partner. i have mentioned that the hiv viral load may have been ordered by mistake. Unclear why it was done. Recommend that she get retested if she has had a new sexual partner since her last test.  rtc as needed

## 2013-02-27 ENCOUNTER — Telehealth: Payer: Self-pay | Admitting: Nurse Practitioner

## 2013-02-27 NOTE — Telephone Encounter (Signed)
Patient finished antibiotic about a week ago and wants to know how long she should use a back up method of birth control?

## 2013-02-27 NOTE — Telephone Encounter (Signed)
Pt was treated for a UTI with Ciprofloxacin bid x 3 days at [facility]([address] was told to use A back up method of contraception while taking this medication. Currently using Loestrin 1/20 fe and condoms Calling to see how long she needs to use the back up method(condoms)  Started Cipro 6/16-6/19/14 bid I told her to continue using her back up method for  the remainder of this week through 02/27/13(which is one week after Finishing Cipro) Is this ok?

## 2013-02-27 NOTE — Telephone Encounter (Signed)
She should use until next cycle

## 2013-02-27 NOTE — Telephone Encounter (Signed)
Advised pt to continue using back up method until her next cycle per Kathleen Argue pt was [facility] with this.

## 2013-06-26 ENCOUNTER — Ambulatory Visit (INDEPENDENT_AMBULATORY_CARE_PROVIDER_SITE_OTHER): Payer: 59 | Admitting: *Deleted

## 2013-06-26 DIAGNOSIS — Z23 Encounter for immunization: Secondary | ICD-10-CM

## 2013-08-27 ENCOUNTER — Encounter: Payer: Self-pay | Admitting: Family Medicine

## 2013-08-27 ENCOUNTER — Ambulatory Visit (INDEPENDENT_AMBULATORY_CARE_PROVIDER_SITE_OTHER): Payer: 59 | Admitting: Family Medicine

## 2013-08-27 VITALS — BP 102/72 | HR 106 | Temp 99.4°F | Resp 16 | Ht 65.0 in | Wt 211.0 lb

## 2013-08-27 DIAGNOSIS — R6889 Other general symptoms and signs: Secondary | ICD-10-CM

## 2013-08-27 DIAGNOSIS — R509 Fever, unspecified: Secondary | ICD-10-CM

## 2013-08-27 DIAGNOSIS — R059 Cough, unspecified: Secondary | ICD-10-CM

## 2013-08-27 DIAGNOSIS — R05 Cough: Secondary | ICD-10-CM

## 2013-08-27 LAB — POCT INFLUENZA A/B
Influenza A, POC: NEGATIVE
Influenza B, POC: NEGATIVE

## 2013-08-27 NOTE — Progress Notes (Signed)
Subjective: Yesterday started getting chief flulike illness with aching, cough, headache, generalized malaise. She did have her flu shot. Today she is even worse with some fever.  Objective: TMs normal. Eyes slightly injected. Nose not terribly congested, just mildly. Throat clear. Neck supple without nodes. Chest clear to auscultation.  Assessment: Flulike illness  Plan: Flu swab  Results for orders placed in visit on 08/27/13  POCT INFLUENZA A/B      Result Value Range   Influenza A, POC Negative     Influenza B, POC Negative      Flu test is negative. She still could have the flu, but is allergic to Tamiflu any help. Treat symptomatically. Off work until at least Friday, though it may take through Saturday before she is feeling much better. Return if not improved by Saturday or Sunday

## 2013-08-27 NOTE — Patient Instructions (Signed)
Drink plenty of fluids and get enough rest. Treat symptoms with over-the-counter medications as needed

## 2013-09-03 ENCOUNTER — Telehealth: Payer: Self-pay

## 2013-09-03 DIAGNOSIS — R059 Cough, unspecified: Secondary | ICD-10-CM

## 2013-09-03 DIAGNOSIS — R05 Cough: Secondary | ICD-10-CM

## 2013-09-03 NOTE — Telephone Encounter (Signed)
PT STATES THE DR SAID IF SHE WAS STILL COUGHING, WE WOULD GIVE HER THE HYDROCODONE AND Z-PAK . WOULD LIKE TO PICK UP THE HYDRO AND WANT [place] TO CALL IN THE Z-PAK. PLEASE CALL PT AT [phone]   [facility] [address]

## 2013-09-03 NOTE — Telephone Encounter (Signed)
Called her she is feeling better no fever, but cough continues she is asking for hycodan and Zpack, please advise.

## 2013-09-04 ENCOUNTER — Telehealth: Payer: Self-pay

## 2013-09-04 NOTE — Telephone Encounter (Signed)
Can you advise in Dr Darrol Angel absence.? Note indicates return if not improved, patient indicates she was told if not improved can try zpack and hycodan, patient has had multiple sick contacts (works at [employer])

## 2013-09-05 ENCOUNTER — Ambulatory Visit (INDEPENDENT_AMBULATORY_CARE_PROVIDER_SITE_OTHER): Payer: 59 | Admitting: Physician Assistant

## 2013-09-05 VITALS — BP 108/78 | HR 72 | Temp 98.8°F | Resp 16

## 2013-09-05 DIAGNOSIS — H9201 Otalgia, right ear: Secondary | ICD-10-CM

## 2013-09-05 DIAGNOSIS — H6981 Other specified disorders of Eustachian tube, right ear: Secondary | ICD-10-CM

## 2013-09-05 DIAGNOSIS — H6980 Other specified disorders of Eustachian tube, unspecified ear: Secondary | ICD-10-CM

## 2013-09-05 DIAGNOSIS — B9789 Other viral agents as the cause of diseases classified elsewhere: Secondary | ICD-10-CM

## 2013-09-05 DIAGNOSIS — H9209 Otalgia, unspecified ear: Secondary | ICD-10-CM

## 2013-09-05 DIAGNOSIS — J069 Acute upper respiratory infection, unspecified: Secondary | ICD-10-CM

## 2013-09-05 MED ORDER — GUAIFENESIN ER 1200 MG PO TB12: 1.0000 | ORAL_TABLET | Freq: Two times a day (BID) | ORAL | Status: AC

## 2013-09-05 MED ORDER — HYDROCOD POLST-CHLORPHEN POLST 10-8 MG/5ML PO LQCR: 5.0000 mL | Freq: Two times a day (BID) | ORAL | Status: AC

## 2013-09-05 MED ORDER — TRIAMCINOLONE ACETONIDE 55 MCG/ACT NA AERO: 2.0000 | INHALATION_SPRAY | Freq: Every day | NASAL | Status: DC

## 2013-09-05 NOTE — Telephone Encounter (Signed)
Pt still coughing - dry irritating cough

## 2013-09-05 NOTE — Progress Notes (Signed)
   Subjective:    Patient ID: Tasha Hansen, female    DOB: 05-08-1986, 28 y.o.   MRN: [id number]  HPI Pt presents to clinic with continued cough from her illness that started last week and then development of right ear pain this afternoon.  She still has some congestion but no rhinorrhea.  Her cough is dry.  OTC meds - theraflu  Review of Systems  Constitutional: Negative for fever and chills.  HENT: Positive for congestion, ear pain (right), hearing loss and postnasal drip. Negative for ear discharge and rhinorrhea.   Respiratory: Positive for cough (dry).   Musculoskeletal: Negative for myalgias.  Neurological: Positive for light-headedness (mild). Negative for headaches.       Objective:   Physical Exam  Vitals reviewed. Constitutional: She is oriented to person, place, and time. She appears well-developed and well-nourished.  HENT:  Head: Normocephalic and atraumatic.  Right Ear: Hearing, external ear and ear canal normal. Tympanic membrane is bulging. Tympanic membrane is not injected, not erythematous and not retracted. A middle ear effusion (air bubbles seen - no erythema) is present.  Left Ear: Hearing, tympanic membrane, external ear and ear canal normal.  Nose: Mucosal edema (red) present.  Mouth/Throat: Uvula is midline, oropharynx is clear and moist and mucous membranes are normal.  Eyes: Conjunctivae are normal.  Neck: Normal range of motion. Neck supple.  Cardiovascular: Normal rate, regular rhythm and normal heart sounds.   No murmur heard. Pulmonary/Chest: Effort normal and breath sounds normal. She has no wheezes.  Lymphadenopathy:    She has no cervical adenopathy.  Neurological: She is alert and oriented to person, place, and time.  Skin: Skin is warm and dry.  Psychiatric: She has a normal mood and affect. Her behavior is normal. Judgment and thought content normal.       Assessment & Plan:  ETD (eustachian tube dysfunction), right - Plan: Guaifenesin  (MUCINEX MAXIMUM STRENGTH) 1200 MG TB12, triamcinolone (NASACORT AQ) 55 MCG/ACT AERO nasal inhaler  Viral URI with cough  Pt has resulting ETD from URI last week.  She will continue symptomatic URI treatment.  Windell Hummingbird PA-C 09/05/2013 4:42 PM

## 2013-09-05 NOTE — Telephone Encounter (Signed)
Spoke to patient, advised cough meds ready.

## 2013-09-06 NOTE — Telephone Encounter (Signed)
Windell Hummingbird PA-C OV noted.

## 2013-09-18 ENCOUNTER — Telehealth: Payer: Self-pay

## 2013-09-18 MED ORDER — TRIAMCINOLONE ACETONIDE 55 MCG/ACT NA AERO: 2.0000 | INHALATION_SPRAY | Freq: Every day | NASAL | Status: DC

## 2013-09-18 NOTE — Telephone Encounter (Signed)
Sent to pharmacy 

## 2013-09-18 NOTE — Telephone Encounter (Signed)
PT WOULD LIKE A REFILL ON HER NASAL SPRAY. BEST# [phone] OR [phone] PHARMACY: [facility] [address]

## 2013-10-13 ENCOUNTER — Encounter: Payer: Self-pay | Admitting: Nurse Practitioner

## 2013-10-13 ENCOUNTER — Ambulatory Visit (INDEPENDENT_AMBULATORY_CARE_PROVIDER_SITE_OTHER): Payer: 59 | Admitting: Nurse Practitioner

## 2013-10-13 ENCOUNTER — Ambulatory Visit (INDEPENDENT_AMBULATORY_CARE_PROVIDER_SITE_OTHER): Payer: 59 | Admitting: Family Medicine

## 2013-10-13 ENCOUNTER — Encounter: Payer: Self-pay | Admitting: Family Medicine

## 2013-10-13 ENCOUNTER — Other Ambulatory Visit: Payer: Self-pay | Admitting: Certified Nurse Midwife

## 2013-10-13 VITALS — BP 110/80 | HR 86 | Temp 97.6°F | Resp 16 | Ht 64.5 in | Wt 211.6 lb

## 2013-10-13 VITALS — BP 120/76 | HR 84 | Ht 65.0 in | Wt 213.0 lb

## 2013-10-13 DIAGNOSIS — Z0000 Encounter for general adult medical examination without abnormal findings: Secondary | ICD-10-CM

## 2013-10-13 DIAGNOSIS — Z01419 Encounter for gynecological examination (general) (routine) without abnormal findings: Secondary | ICD-10-CM

## 2013-10-13 DIAGNOSIS — Z23 Encounter for immunization: Secondary | ICD-10-CM

## 2013-10-13 DIAGNOSIS — R0981 Nasal congestion: Secondary | ICD-10-CM

## 2013-10-13 DIAGNOSIS — Z113 Encounter for screening for infections with a predominantly sexual mode of transmission: Secondary | ICD-10-CM

## 2013-10-13 LAB — POCT URINALYSIS DIPSTICK
Blood, UA: NEGATIVE
Glucose, UA: NEGATIVE
Ketones, UA: NEGATIVE
Leukocytes, UA: NEGATIVE
Nitrite, UA: NEGATIVE
Protein, UA: NEGATIVE
Spec Grav, UA: >=1.03
Urobilinogen, UA: 0.2
pH, UA: 5.5

## 2013-10-13 MED ORDER — NORETHIN ACE-ETH ESTRAD-FE 1-20 MG-MCG PO TABS: 1.0000 | ORAL_TABLET | Freq: Every day | ORAL | Status: DC

## 2013-10-13 MED ORDER — IPRATROPIUM BROMIDE 0.03 % NA SOLN: 2.0000 | Freq: Three times a day (TID) | NASAL | Status: DC

## 2013-10-13 NOTE — Progress Notes (Signed)
[facility] [address], [place] [address] [phone]  Date:  10/13/2013   Name:  Tasha Hansen   DOB:  07-12-1986   MRN:  [id number]  PCP:  Patria Mane, MD    Chief Complaint: Annual Exam   History of Present Illness:  Tasha Hansen is a 28 y.o. very pleasant female patient who presents with the following:  Here today for a CPE.  She did have viral meningitis in 2013 and was hospitalized for one week.  Otherwise she has always been a healthy person.   She continues to have a lot of nasal congestion and has also more recently noted snoring.  Her finance is complaining about this. She has been a snorer for about one year.  She does note that she has gained some weight and thinks this is likely related. Her fiance has not noted any apneic episodes  Wt Readings from Last 3 Encounters:  10/13/13 211 lb 9.6 oz (95.981 kg)  08/27/13 211 lb (95.709 kg)  12/21/11 205 lb (92.987 kg)   She thinks she has put on 12- 15 lbs over the last year.  She does admit to being under a lot of stress and to being a "stress eater." She is a full time student and works a lot- she does admit to eating fast foot about 4x a week at lunch.  She does not eat breakfast.  She does drink water usually. Only occasional soda or tea.  "I don't plan my meals and then just grab whatever."    She is fasting today.   She is still taking nasacort and does think that it helps some.  She does have sneezing, some runny nose.   No cough.    She does not need a pap- she does see OBG.  She will see her later today and will get her OCP then.   She is recently engaged and plans to be married in 2016.  She thinks she may have had a tetanus show in the last 10 years- however she is not sure of the date and it was not done at [facility].  She would like to go ahead and do a tdap today to ensure she is covered  There are no active problems to display for this patient.   Past Medical History  Diagnosis Date   . No pertinent past medical history     Past Surgical History  Procedure Laterality Date  . Hernia repair      as an infant    History  Substance Use Topics  . Smoking status: Never Smoker   . Smokeless tobacco: Never Used  . Alcohol Use: No    Family History  Problem Relation Age of Onset  . Hypertension Father   . Hypertension Maternal Grandmother   . Diabetes Maternal Grandmother   . Hypertension Maternal Grandfather   . Diabetes Maternal Grandfather   . Hypertension Paternal Grandmother   . Diabetes Paternal Grandmother     Allergies  Allergen Reactions  . Diphenhydramine Hcl Itching  . Tamiflu Itching    Medication list has been reviewed and updated.  Current Outpatient Prescriptions on File Prior to Visit  Medication Sig Dispense Refill  . acetaminophen (TYLENOL) 500 MG tablet Take 1,000 mg by mouth every 6 (six) hours as needed.      . Multiple Vitamin (MULITIVITAMIN WITH MINERALS) TABS Take 1 tablet by mouth daily.      . Norethindrone Acetate-Ethinyl Estrad-FE (LOESTRIN 24 FE)  1-20 MG-MCG(24) tablet Take 1 tablet by mouth daily.      Marland Kitchen triamcinolone (NASACORT AQ) 55 MCG/ACT AERO nasal inhaler Place 2 sprays into the nose daily.  1 Inhaler  12  . norethindrone-ethinyl estradiol (LOESTRIN 1/20, 21,) 1-20 MG-MCG tablet Take 1 tablet by mouth daily.       No current facility-administered medications on file prior to visit.    Review of Systems:  As per HPI- otherwise negative.   Physical Examination: Filed Vitals:   10/13/13 0816  BP: 110/80  Pulse: 86  Temp: 97.6 F (36.4 C)  Resp: 16   Filed Vitals:   10/13/13 0816  Height: 5' 4.5" (1.638 m)  Weight: 211 lb 9.6 oz (95.981 kg)   Body mass index is 35.77 kg/(m^2). Ideal Body Weight: Weight in (lb) to have BMI = 25: 147.6  GEN: WDWN, NAD, Non-toxic, A & O x 3, overweight, looks well HEENT: Atraumatic, Normocephalic. Neck supple. No masses, No LAD.  Bilateral TM wnl, oropharynx normal.   PEERL,EOMI.  She does have a smaller oropharynx which may predispose her to snoring  Ears and Nose: No external deformity. CV: RRR, No M/G/R. No JVD. No thrill. No extra heart sounds. PULM: CTA B, no wheezes, crackles, rhonchi. No retractions. No resp. distress. No accessory muscle use. ABD: S, NT, ND. No rebound. No HSM. EXTR: No c/c/e NEURO Normal gait.  PSYCH: Normally interactive. Conversant. Not depressed or anxious appearing.  Calm demeanor.    Assessment and Plan: Routine general medical examination at a health care facility - Plan: POCT urinalysis dipstick, CBC, Comprehensive metabolic panel, TSH, Lipid panel, Tdap vaccine greater than or equal to 7yo IM  Nasal congestion - Plan: ipratropium (ATROVENT) 0.03 % nasal spray  Await labs as above.  tdap today.  She sees OBG for her women's health care and OCP refills Follow- up with lab results Discussed strategies to work on her diet with a goal of losing a few lbs. I do think this will help with her snoring.  If it does not consider a sleep study Try atrovent nasal spray for her nasal sx- this may also help with PND that could be exacerbating her snoring  See patient instructions for more details.     Signed Lamar Blinks, MD  Results for orders placed in visit on 10/13/13  POCT URINALYSIS DIPSTICK      Result Value Range   Color, UA orange     Clarity, UA clear     Glucose, UA neg     Bilirubin, UA small     Ketones, UA neg     Spec Grav, UA >=1.030     Blood, UA neg     pH, UA 5.5     Protein, UA neg     Urobilinogen, UA 0.2     Nitrite, UA neg     Leukocytes, UA Negative

## 2013-10-13 NOTE — Progress Notes (Signed)
   Subjective:    Patient ID: Tasha Hansen, female    DOB: 1985-11-08, 28 y.o.   MRN: [id number]  HPI    Review of Systems  Constitutional: Negative.   HENT: Negative.   Eyes: Negative.   Respiratory: Negative.   Cardiovascular: Negative.   Gastrointestinal: Negative.   Endocrine: Negative.   Genitourinary: Negative.   Musculoskeletal: Negative.   Skin: Negative.   Allergic/Immunologic: Negative.   Neurological: Negative.   Hematological: Negative.   Psychiatric/Behavioral: Negative.        Objective:   Physical Exam        Assessment & Plan:

## 2013-10-13 NOTE — Patient Instructions (Addendum)
Great to see you today! Please sign up for mychart.  I will be in touch regarding your labs.   Try the atrovent nasal 2 or 3x a day- let me know if not helpful. If you continue to snore a lot we can think about a sleep study.    Try a few of the ideas we discussed for managing your weight -eat breakfast.  Something quick like an apple and cheese or PB toast is easy to do -prepare a few lunches in advance when you have time.   -making a big batch of soup, chili, etc is easy to pack for lunch along with fruit and carrot/ celery sticks -drink a big glass of water before meals -don't be too hard on yourself.  These changes will take time, but keep working on positive steps and you will be successful.  Tetanus, Diphtheria (Td) Vaccine What You Need to Know WHY GET VACCINATED? Tetanus  and diphtheria are very serious diseases. They are rare in the [place] today, but people who do become infected often have severe complications. Td vaccine is used to protect adolescents and adults from both of these diseases. Both tetanus and diphtheria are infections caused by bacteria. Diphtheria spreads from person to person through coughing or sneezing. Tetanus-causing bacteria enter the body through cuts, scratches, or wounds. TETANUS (Lockjaw) causes painful muscle tightening and stiffness, usually all over the body.  It can lead to tightening of muscles in the head and neck so you can't open your mouth, swallow, or sometimes even breathe. Tetanus kills about 1 out of every 5 people who are infected. DIPHTHERIA can cause a thick coating to form in the back of the throat.  It can lead to breathing problems, paralysis, heart failure, and death. Before vaccines, the [place] saw as many as 200,000 cases a year of diphtheria and hundreds of cases of tetanus. Since vaccination began, cases of both diseases have dropped by about 99%. TD VACCINE Td vaccine can protect adolescents and adults from tetanus and  diphtheria. Td is usually given as a booster dose every 10 years but it can also be given earlier after a severe and dirty wound or burn. Your doctor can give you more information. Td may safely be given at the same time as other vaccines. SOME PEOPLE SHOULD NOT GET THIS VACCINE  If you ever had a life-threatening allergic reaction after a dose of any tetanus or diphtheria containing vaccine, OR if you have a severe allergy to any part of this vaccine, you should not get Td. Tell your doctor if you have any severe allergies.  Talk to your doctor if you:  have epilepsy or another nervous system problem,  had severe pain or swelling after any vaccine containing diphtheria or tetanus,  ever had Guillain Barr Syndrome (GBS),  aren't feeling well on the day the shot is scheduled. RISKS OF A VACCINE REACTION With a vaccine, like any medicine, there is a chance of side effects. These are usually mild and go away on their own. Serious side effects are also possible, but are very rare. Most people who get Td vaccine do not have any problems with it. Mild Problems  following Td (Did not interfere with activities)  Pain where the shot was given (about 8 people in 10)  Redness or swelling where the shot was given (about 1 person in 3)  Mild fever (about 1 person in 15)  Headache or Tiredness (uncommon) Moderate Problems following Td (Interfered with  activities, but did not require medical attention)  Fever over 102 F (38.9 C) (rare) Severe Problems  following Td (Unable to perform usual activities; required medical attention)  Swelling, severe pain, bleeding, or redness in the arm where the shot was given (rare). Problems that could happen after any vaccine:  Brief fainting spells can happen after any medical procedure, including vaccination. Sitting or lying down for about 15 minutes can help prevent fainting, and injuries caused by a fall. Tell your doctor if you feel dizzy, or have  vision changes or ringing in the ears.  Severe shoulder pain and reduced range of motion in the arm where a shot was given can happen, very rarely, after a vaccination.  Severe allergic reactions from a vaccine are very rare, estimated at less than 1 in a million doses. If one were to occur, it would usually be within a few minutes to a few hours after the vaccination. WHAT IF THERE IS A SERIOUS REACTION? What should I look for?  Look for anything that concerns you, such as signs of a severe allergic reaction, very high fever, or behavior changes. Signs of a severe allergic reaction can include hives, swelling of the face and throat, difficulty breathing, a fast heartbeat, dizziness, and weakness. These would usually start a few minutes to a few hours after the vaccination. What should I do?  If you think it is a severe allergic reaction or other emergency that can't wait, call 911 or get the person to the nearest hospital. Otherwise, call your doctor.  Afterward, the reaction should be reported to the Vaccine Adverse Event Reporting System (VAERS). Your doctor might file this report, or, you can do it yourself through the VAERS website or by calling [phone]. VAERS is only for reporting reactions. They do not give medical advice. THE NATIONAL VACCINE INJURY COMPENSATION PROGRAM The National Vaccine Injury Compensation Program (VICP) is a federal program that was created to compensate people who may have been injured by certain vaccines. Persons who believe they may have been injured by a vaccine can learn about the program and about filing a claim by calling (407)369-4265 or visiting the [facility] website. HOW CAN I LEARN MORE?  Ask your doctor.  Contact your local or state health department.  Contact the Centers for Disease Control and Prevention (CDC):  Call 920-409-0635 (1-800-CDC-INFO)  Visit CDC's vaccines website CDC Td Vaccine Interim VIS (10/08/12) Document Released:  06/18/2006 Document Revised: 12/16/2012 Document Reviewed: 12/11/2012 [facility] Patient Information 2014 [place], [place].

## 2013-10-13 NOTE — Patient Instructions (Signed)
General topics  Next pap or exam is  due in 1 year Take a Women's multivitamin Take 1200 mg. of calcium daily - prefer dietary If any concerns in interim to call back  Breast Self-Awareness Practicing breast self-awareness may pick up problems early, prevent significant medical complications, and possibly save your life. By practicing breast self-awareness, you can become familiar with how your breasts look and feel and if your breasts are changing. This allows you to notice changes early. It can also offer you some reassurance that your breast health is good. One way to learn what is normal for your breasts and whether your breasts are changing is to do a breast self-exam. If you find a lump or something that was not present in the past, it is best to contact your caregiver right away. Other findings that should be evaluated by your caregiver include nipple discharge, especially if it is bloody; skin changes or reddening; areas where the skin seems to be pulled in (retracted); or new lumps and bumps. Breast pain is seldom associated with cancer (malignancy), but should also be evaluated by a caregiver. BREAST SELF-EXAM The best time to examine your breasts is 5 7 days after your menstrual period is over.  ExitCare Patient Information 2013 [employer].   Exercise to Stay Healthy Exercise helps you become and stay healthy. EXERCISE IDEAS AND TIPS Choose exercises that:  You enjoy.  Fit into your day. You do not need to exercise really hard to be healthy. You can do exercises at a slow or medium level and stay healthy. You can:  Stretch before and after working out.  Try yoga, Pilates, or tai chi.  Lift weights.  Walk fast, swim, jog, run, climb stairs, bicycle, dance, or rollerskate.  Take aerobic classes. Exercises that burn about 150 calories:  Running 1  miles in 15 minutes.  Playing volleyball for 45 to 60 minutes.  Washing and waxing a car for 45 to 60  minutes.  Playing touch football for 45 minutes.  Walking 1  miles in 35 minutes.  Pushing a stroller 1  miles in 30 minutes.  Playing basketball for 30 minutes.  Raking leaves for 30 minutes.  Bicycling 5 miles in 30 minutes.  Walking 2 miles in 30 minutes.  Dancing for 30 minutes.  Shoveling snow for 15 minutes.  Swimming laps for 20 minutes.  Walking up stairs for 15 minutes.  Bicycling 4 miles in 15 minutes.  Gardening for 30 to 45 minutes.  Jumping rope for 15 minutes.  Washing windows or floors for 45 to 60 minutes. Document Released: 09/23/2010 Document Revised: 11/13/2011 Document Reviewed: 09/23/2010 ExitCare Patient Information 2013 Exit[employer].   Other topics ( that may be useful information):    Sexually Transmitted Disease Sexually transmitted disease (STD) refers to any infection that is passed from person to person during sexual activity. This may happen by way of saliva, semen, blood, vaginal mucus, or urine. Common STDs include:  Gonorrhea.  Chlamydia.  Syphilis.  HIV/AIDS.  Genital herpes.  Hepatitis B and C.  Trichomonas.  Human papillomavirus (HPV).  Pubic lice. CAUSES  An STD may be spread by bacteria, virus, or parasite. A person can get an STD by:  Sexual intercourse with an infected person.  Sharing sex toys with an infected person.  Sharing needles with an infected person.  Having intimate contact with the genitals, mouth, or rectal areas of an infected person. SYMPTOMS  Some people may not have any symptoms, but   they can still pass the infection to others. Different STDs have different symptoms. Symptoms include:  Painful or bloody urination.  Pain in the pelvis, abdomen, vagina, anus, throat, or eyes.  Skin rash, itching, irritation, growths, or sores (lesions). These usually occur in the genital or anal area.  Abnormal vaginal discharge.  Penile discharge in men.  Soft, flesh-colored skin growths in the  genital or anal area.  Fever.  Pain or bleeding during sexual intercourse.  Swollen glands in the groin area.  Yellow skin and eyes (jaundice). This is seen with hepatitis. DIAGNOSIS  To make a diagnosis, your caregiver may:  Take a medical history.  Perform a physical exam.  Take a specimen (culture) to be examined.  Examine a sample of discharge under a microscope.  Perform blood test TREATMENT   Chlamydia, gonorrhea, trichomonas, and syphilis can be cured with antibiotic medicine.  Genital herpes, hepatitis, and HIV can be treated, but not cured, with prescribed medicines. The medicines will lessen the symptoms.  Genital warts from HPV can be treated with medicine or by freezing, burning (electrocautery), or surgery. Warts may come back.  HPV is a virus and cannot be cured with medicine or surgery.However, abnormal areas may be followed very closely by your caregiver and may be removed from the cervix, vagina, or vulva through office procedures or surgery. If your diagnosis is confirmed, your recent sexual partners need treatment. This is true even if they are symptom-free or have a negative culture or evaluation. They should not have sex until their caregiver says it is okay. HOME CARE INSTRUCTIONS  All sexual partners should be informed, tested, and treated for all STDs.  Take your antibiotics as directed. Finish them even if you start to feel better.  Only take over-the-counter or prescription medicines for pain, discomfort, or fever as directed by your caregiver.  Rest.  Eat a balanced diet and drink enough fluids to keep your urine clear or pale yellow.  Do not have sex until treatment is completed and you have followed up with your caregiver. STDs should be checked after treatment.  Keep all follow-up appointments, Pap tests, and blood tests as directed by your caregiver.  Only use latex condoms and water-soluble lubricants during sexual activity. Do not use  petroleum jelly or oils.  Avoid alcohol and illegal drugs.  Get vaccinated for HPV and hepatitis. If you have not received these vaccines in the past, talk to your caregiver about whether one or both might be right for you.  Avoid risky sex practices that can break the skin. The only way to avoid getting an STD is to avoid all sexual activity.Latex condoms and dental dams (for oral sex) will help lessen the risk of getting an STD, but will not completely eliminate the risk. SEEK MEDICAL CARE IF:   You have a fever.  You have any new or worsening symptoms. Document Released: 11/11/2002 Document Revised: 11/13/2011 Document Reviewed: 11/18/2010 [facility] Patient Information 2013 [employer].    Domestic Abuse You are being battered or abused if someone close to you hits, pushes, or physically hurts you in any way. You also are being abused if you are forced into activities. You are being sexually abused if you are forced to have sexual contact of any kind. You are being emotionally abused if you are made to feel worthless or if you are constantly threatened. It is important to remember that help is available. No one has the right to abuse you. PREVENTION OF FURTHER  ABUSE  Learn the warning signs of danger. This varies with situations but may include: the use of alcohol, threats, isolation from friends and family, or forced sexual contact. Leave if you feel that violence is going to occur.  If you are attacked or beaten, report it to the police so the abuse is documented. You do not have to press charges. The police can protect you while you or the attackers are leaving. Get the officer's name and badge number and a copy of the report.  Find someone you can trust and tell them what is happening to you: your caregiver, a nurse, clergy member, close friend or family member. Feeling ashamed is natural, but remember that you have done nothing wrong. No one deserves abuse. Document Released:  08/18/2000 Document Revised: 11/13/2011 Document Reviewed: 10/27/2010 ExitCare Patient Information 2013 [employer].    How Much is Too Much Alcohol? Drinking too much alcohol can cause injury, accidents, and health problems. These types of problems can include:   Car crashes.  Falls.  Family fighting (domestic violence).  Drowning.  Fights.  Injuries.  Burns.  Damage to certain organs.  Having a baby with birth defects. ONE DRINK CAN BE TOO MUCH WHEN YOU ARE:  Working.  Pregnant or breastfeeding.  Taking medicines. Ask your doctor.  Driving or planning to drive. If you or someone you know has a drinking problem, get help from a doctor.  Document Released: 06/17/2009 Document Revised: 11/13/2011 Document Reviewed: 06/17/2009 ExitCare Patient Information 2013 [employer]Care[employer].   Smoking Hazards Smoking cigarettes is extremely bad for your health. Tobacco smoke has over 200 known poisons in it. There are over 60 chemicals in tobacco smoke that cause cancer. Some of the chemicals found in cigarette smoke include:   Cyanide.  Benzene.  Formaldehyde.  Methanol (wood alcohol).  Acetylene (fuel used in welding torches).  Ammonia. Cigarette smoke also contains the poisonous gases nitrogen oxide and carbon monoxide.  Cigarette smokers have an increased risk of many serious medical problems and Smoking causes approximately:  90% of all lung cancer deaths in men.  80% of all lung cancer deaths in women.  90% of deaths from chronic obstructive lung disease. Compared with nonsmokers, smoking increases the risk of:  Coronary heart disease by 2 to 4 times.  Stroke by 2 to 4 times.  Men developing lung cancer by 23 times.  Women developing lung cancer by 13 times.  Dying from chronic obstructive lung diseases by 12 times.  . Smoking is the most preventable cause of death and disease in our society.  WHY IS SMOKING ADDICTIVE?  Nicotine is the chemical  agent in tobacco that is capable of causing addiction or dependence.  When you smoke and inhale, nicotine is absorbed rapidly into the bloodstream through your lungs. Nicotine absorbed through the lungs is capable of creating a powerful addiction. Both inhaled and non-inhaled nicotine may be addictive.  Addiction studies of cigarettes and spit tobacco show that addiction to nicotine occurs mainly during the teen years, when young people begin using tobacco products. WHAT ARE THE BENEFITS OF QUITTING?  There are many health benefits to quitting smoking.   Likelihood of developing cancer and heart disease decreases. Health improvements are seen almost immediately.  Blood pressure, pulse rate, and breathing patterns start returning to normal soon after quitting. QUITTING SMOKING   American Lung Association - 1-800-LUNGUSA  American Cancer Society - 1-800-ACS-2345 Document Released: 09/28/2004 Document Revised: 11/13/2011 Document Reviewed: 06/02/2009 ExitCare Patient Information 2013 ExitCare,   LLC.   Stress Management Stress is a state of physical or mental tension that often results from changes in your life or normal routine. Some common causes of stress are:  Death of a loved one.  Injuries or severe illnesses.  Getting fired or changing jobs.  Moving into a new home. Other causes may be:  Sexual problems.  Business or financial losses.  Taking on a large debt.  Regular conflict with someone at home or at work.  Constant tiredness from lack of sleep. It is not just bad things that are stressful. It may be stressful to:  Win the lottery.  Get married.  Buy a new car. The amount of stress that can be easily tolerated varies from person to person. Changes generally cause stress, regardless of the types of change. Too much stress can affect your health. It may lead to physical or emotional problems. Too little stress (boredom) may also become stressful. SUGGESTIONS TO  REDUCE STRESS:  Talk things over with your family and friends. It often is helpful to share your concerns and worries. If you feel your problem is serious, you may want to get help from a professional counselor.  Consider your problems one at a time instead of lumping them all together. Trying to take care of everything at once may seem impossible. List all the things you need to do and then start with the most important one. Set a goal to accomplish 2 or 3 things each day. If you expect to do too many in a single day you will naturally fail, causing you to feel even more stressed.  Do not use alcohol or drugs to relieve stress. Although you may feel better for a short time, they do not remove the problems that caused the stress. They can also be habit forming.  Exercise regularly - at least 3 times per week. Physical exercise can help to relieve that "uptight" feeling and will relax you.  The shortest distance between despair and hope is often a good night's sleep.  Go to bed and get up on time allowing yourself time for appointments without being rushed.  Take a short "time-out" period from any stressful situation that occurs during the day. Close your eyes and take some deep breaths. Starting with the muscles in your face, tense them, hold it for a few seconds, then relax. Repeat this with the muscles in your neck, shoulders, hand, stomach, back and legs.  Take good care of yourself. Eat a balanced diet and get plenty of rest.  Schedule time for having fun. Take a break from your daily routine to relax. HOME CARE INSTRUCTIONS   Call if you feel overwhelmed by your problems and feel you can no longer manage them on your own.  Return immediately if you feel like hurting yourself or someone else. Document Released: 02/14/2001 Document Revised: 11/13/2011 Document Reviewed: 10/07/2007 ExitCare Patient Information 2013 ExitCare, LLC.  

## 2013-10-13 NOTE — Progress Notes (Signed)
Patient ID: Tasha Hansen, female   DOB: 1985-12-29, 28 y.o.   MRN: [id number] 28 y.o. G0P0 Single Caucasian Fe here for annual exam.  Menses X 4 days.  Flow is moderate then light, minimal cramps, no PMS.  Same partner X 9 years.  Engaged to be married 2016.  Patient's last menstrual period was 09/19/2013.          Sexually active: yes  The current method of family planning is OCP (estrogen/progesterone).    Exercising: no  The patient does not participate in regular exercise at present. Smoker:  no  Health Maintenance: Pap:  09/19/10, WNL TDaP:  10/13/13  Gardasil:  declines Labs:  HB:  PCP  Urine:  PCP   reports that she has never smoked. She has never used smokeless tobacco. She reports that she does not drink alcohol or use illicit drugs.  Past Medical History  Diagnosis Date  . No pertinent past medical history     Past Surgical History  Procedure Laterality Date  . Hernia repair      as an infant    Current Outpatient Prescriptions  Medication Sig Dispense Refill  . acetaminophen (TYLENOL) 500 MG tablet Take 1,000 mg by mouth every 6 (six) hours as needed.      Marland Kitchen ipratropium (ATROVENT) 0.03 % nasal spray Place 2 sprays into the nose 3 (three) times daily. Use as needed for nasal congestion  30 mL  11  . Multiple Vitamin (MULITIVITAMIN WITH MINERALS) TABS Take 1 tablet by mouth daily.      . norethindrone-ethinyl estradiol (GILDESS FE 1/20) 1-20 MG-MCG tablet Take 1 tablet by mouth daily.      Marland Kitchen triamcinolone (NASACORT AQ) 55 MCG/ACT AERO nasal inhaler Place 2 sprays into the nose daily.  1 Inhaler  12   No current facility-administered medications for this visit.    Family History  Problem Relation Age of Onset  . Hypertension Father   . Hypertension Maternal Grandmother   . Diabetes Maternal Grandmother   . Hypertension Maternal Grandfather   . Diabetes Maternal Grandfather   . Hypertension Paternal Grandmother   . Diabetes Paternal Grandmother     ROS:   Pertinent items are noted in HPI.  Otherwise, a comprehensive ROS was negative.  Exam:   BP 120/76  Pulse 84  Ht 5\' 5"  (1.651 m)  Wt 213 lb (96.616 kg)  BMI 35.44 kg/m2  LMP 09/19/2013 Height: 5\' 5"  (165.1 cm)  Ht Readings from Last 3 Encounters:  10/13/13 5\' 5"  (1.651 m)  10/13/13 5' 4.5" (1.638 m)  08/27/13 5\' 5"  (1.651 m)    General appearance: alert, cooperative and appears stated age Head: Normocephalic, without obvious abnormality, atraumatic Neck: no adenopathy, supple, symmetrical, trachea midline and thyroid normal to inspection and palpation Lungs: clear to auscultation bilaterally Breasts: normal appearance, no masses or tenderness Heart: regular rate and rhythm Abdomen: soft, non-tender; no masses,  no organomegaly Extremities: extremities normal, atraumatic, no cyanosis or edema Skin: Skin color, texture, turgor normal. No rashes or lesions Lymph nodes: Cervical, supraclavicular, and axillary nodes normal. No abnormal inguinal nodes palpated Neurologic: Grossly normal   Pelvic: External genitalia:  no lesions              Urethra:  normal appearing urethra with no masses, tenderness or lesions              Bartholin's and Skene's: normal  Vagina: normal appearing vagina with normal color and discharge, no lesions              Cervix: anteverted              Pap taken: yes Bimanual Exam:  Uterus:  normal size, contour, position, consistency, mobility, non-tender              Adnexa: no mass, fullness, tenderness               Rectovaginal: Confirms               Anus:  normal sphincter tone, no lesions  A:  Well Woman with normal exam  OCP for contraception  R/O STD's  P:   Pap smear as per guidelines  Refill OCP Gildess Fe for a year   Call with test results  Counseled on breast self exam, STD prevention, adequate intake of calcium and vitamin D, diet and exercise return annually or prn  An After Visit Summary was printed and given to the  patient.

## 2013-10-14 LAB — IPS PAP TEST WITH REFLEX TO HPV

## 2013-10-14 LAB — STD PANEL
HIV: NONREACTIVE
Hepatitis B Surface Ag: NEGATIVE

## 2013-10-14 LAB — IPS N GONORRHOEA AND CHLAMYDIA BY PCR

## 2013-10-16 NOTE — Progress Notes (Signed)
Encounter reviewd by Dr. Josefa Half.

## 2013-10-27 ENCOUNTER — Other Ambulatory Visit (INDEPENDENT_AMBULATORY_CARE_PROVIDER_SITE_OTHER): Payer: 59

## 2013-10-27 DIAGNOSIS — Z0000 Encounter for general adult medical examination without abnormal findings: Secondary | ICD-10-CM

## 2013-10-27 LAB — CBC WITH DIFFERENTIAL/PLATELET
Basophils Absolute: 0 10*3/uL (ref 0.0–0.1)
Basophils Relative: 0 % (ref 0–1)
Eosinophils Absolute: 0.1 10*3/uL (ref 0.0–0.7)
Eosinophils Relative: 1 % (ref 0–5)
HCT: 38.2 % (ref 36.0–46.0)
Hemoglobin: 12.8 g/dL (ref 12.0–15.0)
Lymphocytes Relative: 40 % (ref 12–46)
Lymphs Abs: 2.2 10*3/uL (ref 0.7–4.0)
MCH: 28.4 pg (ref 26.0–34.0)
MCHC: 33.5 g/dL (ref 30.0–36.0)
MCV: 84.7 fL (ref 78.0–100.0)
Monocytes Absolute: 0.5 10*3/uL (ref 0.1–1.0)
Monocytes Relative: 9 % (ref 3–12)
Neutro Abs: 2.7 10*3/uL (ref 1.7–7.7)
Neutrophils Relative %: 50 % (ref 43–77)
Platelets: 288 10*3/uL (ref 150–400)
RBC: 4.51 MIL/uL (ref 3.87–5.11)
RDW: 13.2 % (ref 11.5–15.5)
WBC: 5.4 10*3/uL (ref 4.0–10.5)

## 2013-10-27 LAB — COMPREHENSIVE METABOLIC PANEL
ALT: 73 U/L — ABNORMAL HIGH (ref 0–35)
AST: 47 U/L — ABNORMAL HIGH (ref 0–37)
Albumin: 3.5 g/dL (ref 3.5–5.2)
Alkaline Phosphatase: 69 U/L (ref 39–117)
BUN: 11 mg/dL (ref 6–23)
CO2: 21 mEq/L (ref 19–32)
Calcium: 8.4 mg/dL (ref 8.4–10.5)
Chloride: 105 mEq/L (ref 96–112)
Creat: 0.71 mg/dL (ref 0.50–1.10)
Glucose, Bld: 87 mg/dL (ref 70–99)
Potassium: 4 mEq/L (ref 3.5–5.3)
Sodium: 134 mEq/L — ABNORMAL LOW (ref 135–145)
Total Bilirubin: 0.6 mg/dL (ref 0.2–1.2)
Total Protein: 7.6 g/dL (ref 6.0–8.3)

## 2013-10-27 LAB — LIPID PANEL
Cholesterol: 155 mg/dL (ref 0–200)
HDL: 53 mg/dL (ref >39)
LDL Cholesterol: 85 mg/dL (ref 0–99)
Total CHOL/HDL Ratio: 2.9 Ratio
Triglycerides: 86 mg/dL (ref <150)
VLDL: 17 mg/dL (ref 0–40)

## 2013-10-27 LAB — TSH: TSH: 2.598 u[IU]/mL (ref 0.350–4.500)

## 2013-10-27 NOTE — Progress Notes (Signed)
Patient here for labs only. 

## 2013-11-01 ENCOUNTER — Encounter: Payer: Self-pay | Admitting: Family Medicine

## 2013-11-01 ENCOUNTER — Other Ambulatory Visit: Payer: Self-pay | Admitting: Family Medicine

## 2013-11-01 DIAGNOSIS — R748 Abnormal levels of other serum enzymes: Secondary | ICD-10-CM

## 2013-11-01 NOTE — Progress Notes (Signed)
Discussed her labs with her.  All looks ok except her LFTs are a little bit high.  Will refer for hepatic US and do repeat LFT in about one week, and also acute hep panel.

## 2013-11-17 ENCOUNTER — Ambulatory Visit
Admission: RE | Admit: 2013-11-17 | Discharge: 2013-11-17 | Disposition: A | Discharge status: Home / Self Care | Payer: 59 | Source: Ambulatory Visit | Attending: Family Medicine | Admitting: Family Medicine

## 2013-11-17 ENCOUNTER — Encounter: Payer: Self-pay | Admitting: Family Medicine

## 2013-11-17 DIAGNOSIS — R748 Abnormal levels of other serum enzymes: Secondary | ICD-10-CM

## 2013-11-18 ENCOUNTER — Encounter: Payer: Self-pay | Admitting: Family Medicine

## 2013-11-24 ENCOUNTER — Telehealth: Payer: Self-pay

## 2013-11-24 NOTE — Telephone Encounter (Signed)
Dr.Copland, I let pt know that her RUQ U/S was negative

## 2013-11-24 NOTE — Telephone Encounter (Signed)
Patient requests that we call to let her know her Ultrasound results on 11/17/13. Please advise; she works here also  Best: [phone]

## 2013-12-15 ENCOUNTER — Ambulatory Visit: Payer: 59 | Admitting: Family Medicine

## 2013-12-31 ENCOUNTER — Ambulatory Visit: Payer: 59

## 2014-06-18 IMAGING — US US ABDOMEN LIMITED
1 series · 14 of 25 positions shown · non-contrast
Comparison: None.

CLINICAL DATA: Elevated LFTs

EXAM:
US ABDOMEN LIMITED - RIGHT UPPER QUADRANT

[Series 1: us abdomen limited · 0.32mm/px · 14 of 42 slices shown]
[im 1/42]
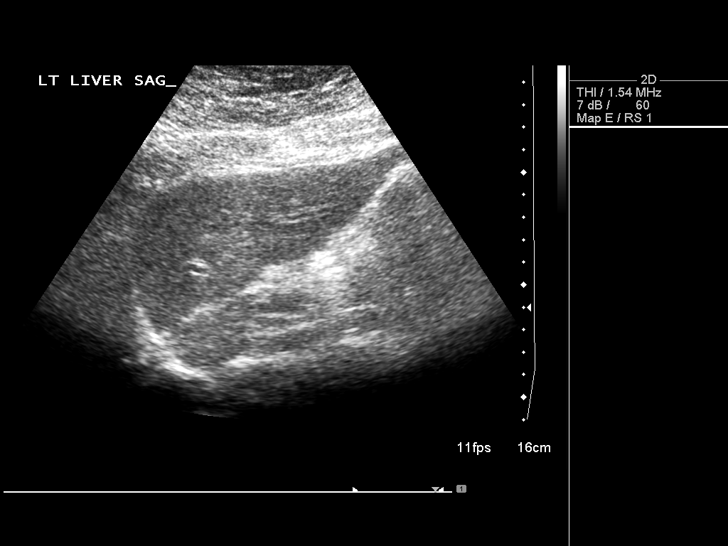
[im 4/42]
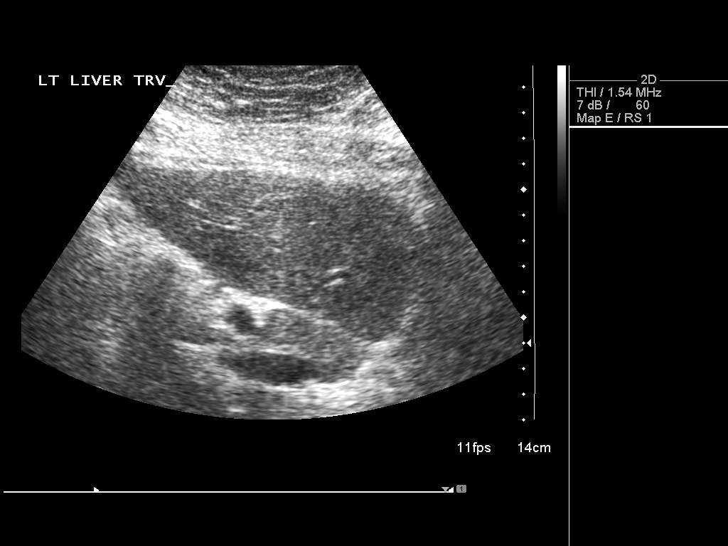
[im 7/42]
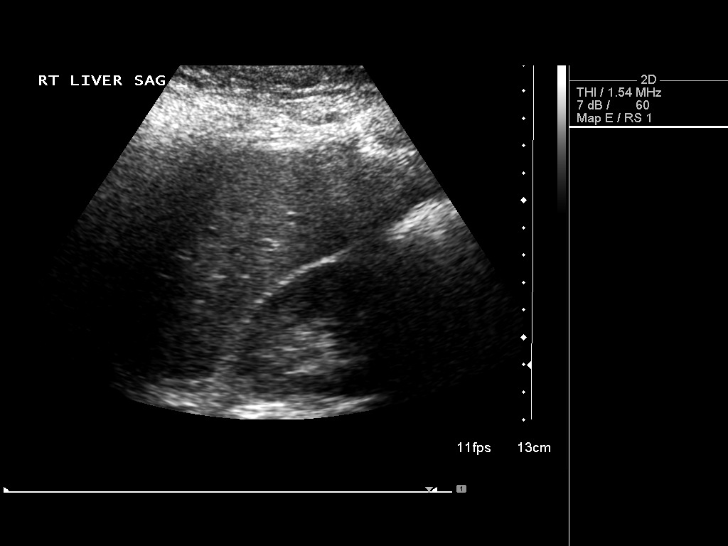
[im 11/42]
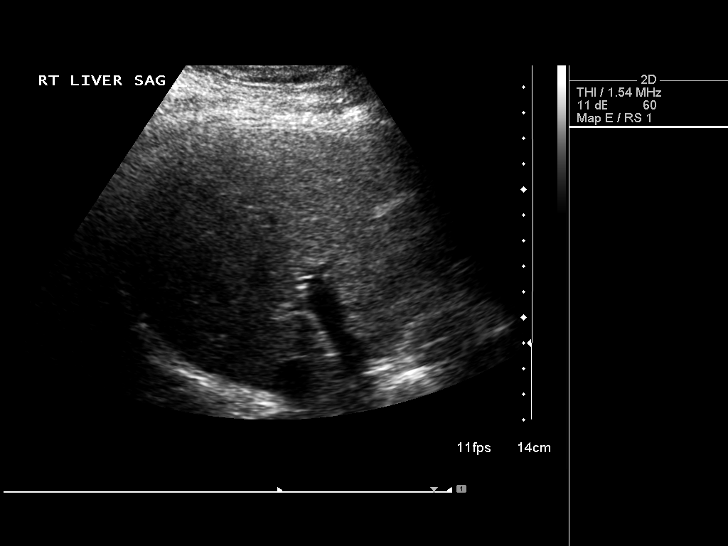
[im 14/42]
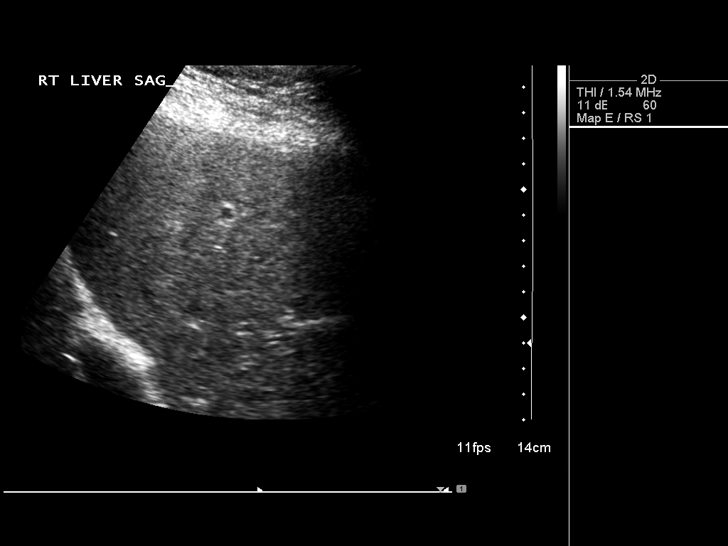
[im 16/42]
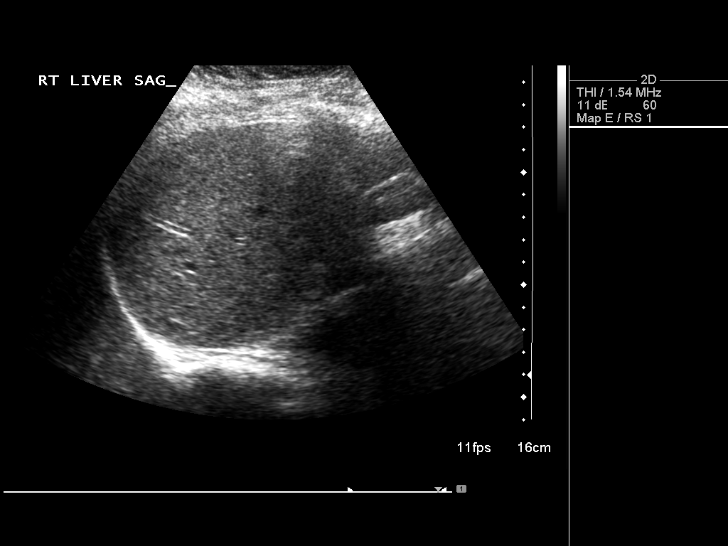
[im 19/42]
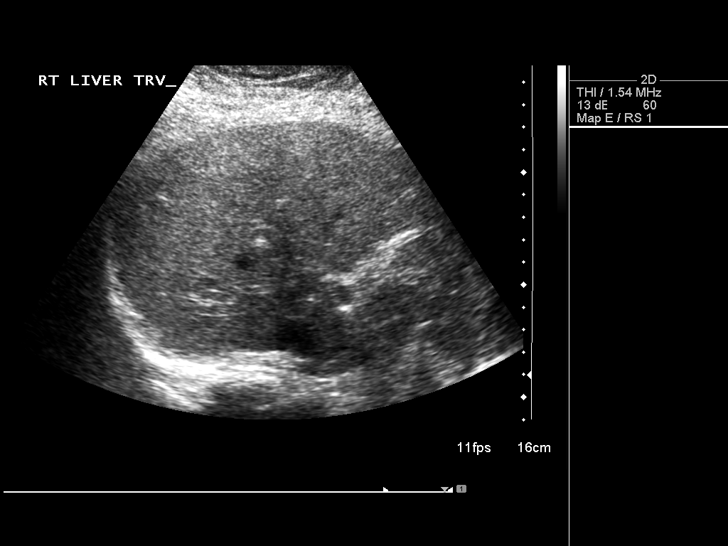
[im 23/42]
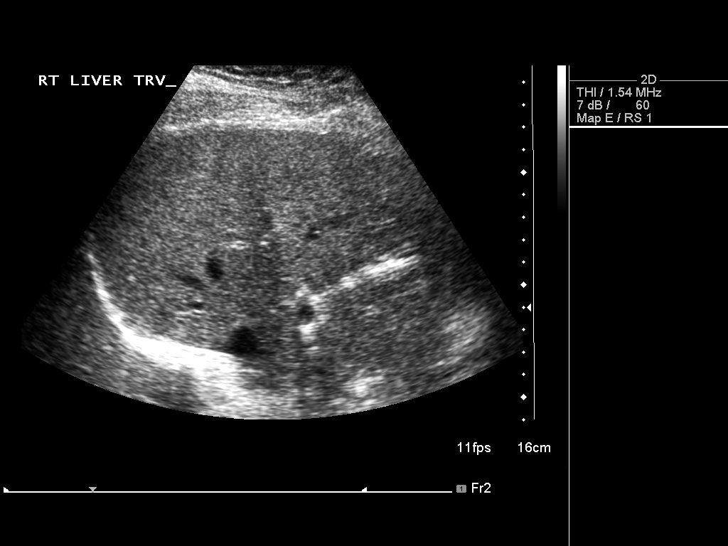
[im 26/42]
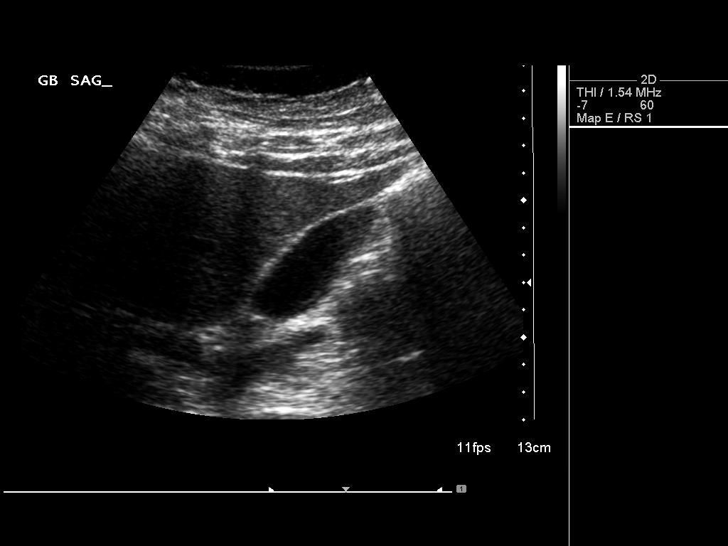
[im 28/42]
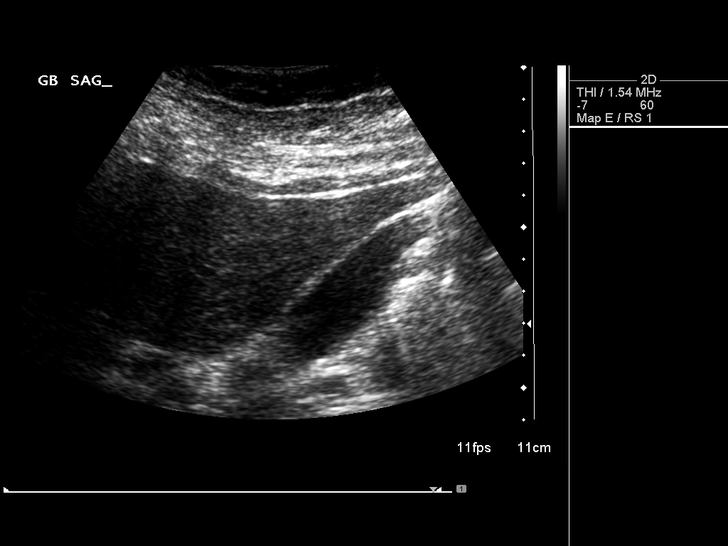
[im 31/42]
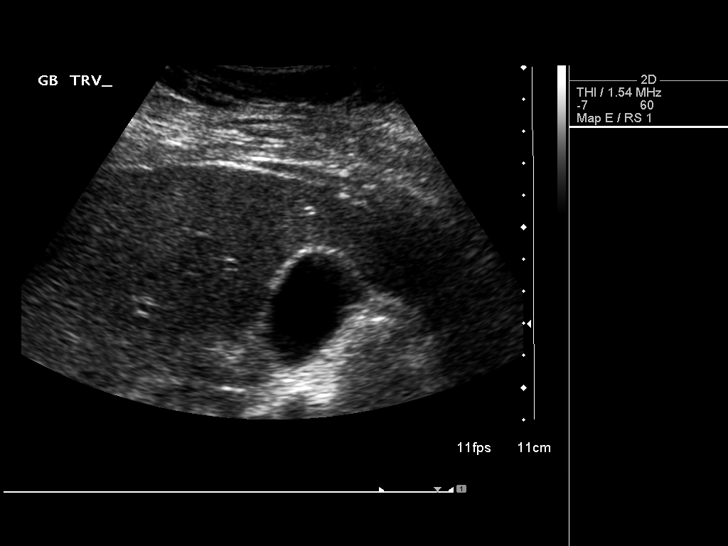
[im 35/42]
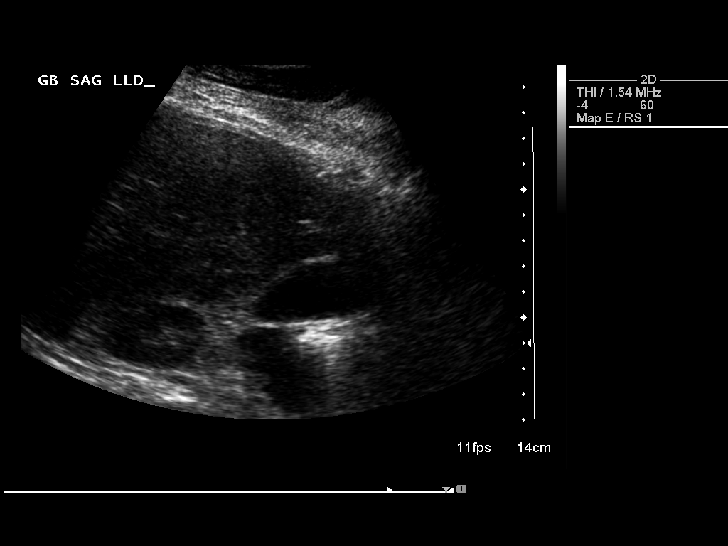
[im 38/42]
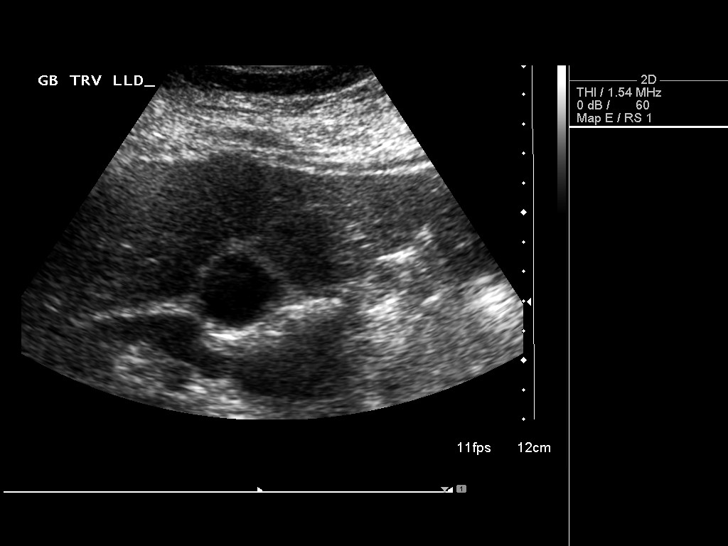
[im 42/42]
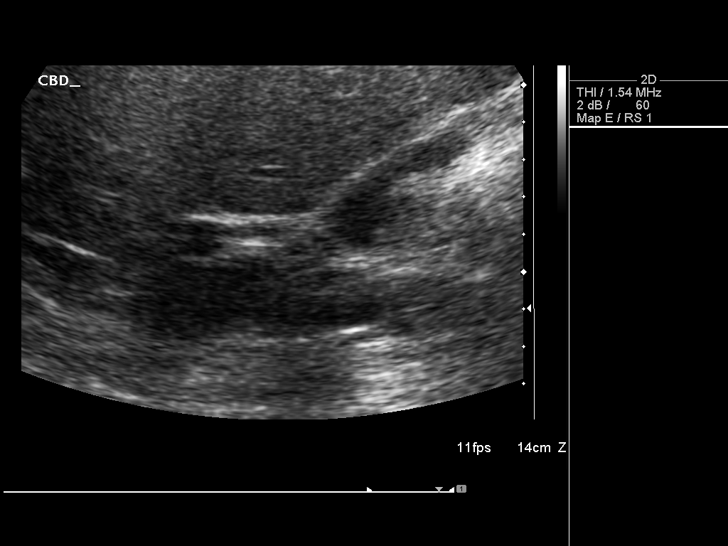

[14 of 25 positions shown; findings below may reference images not displayed]

FINDINGS: Gallbladder:

No gallstones, gallbladder wall thickening, or pericholecystic
fluid. Negative sonographic Murphy's sign.

Common bile duct:

Diameter: 3.5 mm

Liver:

No focal lesion identified. Within normal limits in parenchymal
echogenicity.
IMPRESSION: Negative right upper quadrant ultrasound.

## 2014-06-21 ENCOUNTER — Ambulatory Visit (INDEPENDENT_AMBULATORY_CARE_PROVIDER_SITE_OTHER): Payer: 59 | Admitting: Physician Assistant

## 2014-06-21 VITALS — BP 112/72 | HR 94 | Temp 98.1°F | Resp 16 | Ht 65.0 in

## 2014-06-21 DIAGNOSIS — M79674 Pain in right toe(s): Secondary | ICD-10-CM

## 2014-06-21 MED ORDER — MELOXICAM 7.5 MG PO TABS: 7.5000 mg | ORAL_TABLET | Freq: Two times a day (BID) | ORAL | Status: DC

## 2014-06-21 NOTE — Progress Notes (Signed)
I have examined this patient along with Mr. Carlis Abbott, [place] and agree.

## 2014-06-21 NOTE — Patient Instructions (Signed)
Use tylenol OTC as needed with Meloxicam.  Continue buddy taping.  Ice can also be helpful for pain.

## 2014-06-21 NOTE — Progress Notes (Signed)
     IDENTIFYING INFORMATION  Tasha Hansen / female / [birth year] / 28 y.o. / MRN: [id number]  The patient  does not have any active problems on file.  SUBJECTIVE  Chief Complaint: Toe Injury   History of present illness: Patient "jammed" her right 5th toe on the wooden leg of the couch in her home last night.  Pain is 7/10, and she qualifies it as throbbing pain.  She has taken no medication.  She reports mild difficulty with walking. She reports having a high pain tolerance.  She denies a history of GERD like symptoms and a history of renal disease.    The patient has a current medication list which includes the following prescription(s): acetaminophen, multivitamin with minerals, norethindrone-ethinyl estradiol, ipratropium, meloxicam, and triamcinolone.  [name] is allergic to diphenhydramine hcl and tamiflu. and she  reports that she has never smoked. She has never used smokeless tobacco. She reports that she does not drink alcohol or use illicit drugs.  The patient  has past surgical history that includes Hernia repair 21-Apr-1987) and Wisdom tooth extraction (age 33). and her family history includes Diabetes in her maternal grandfather, maternal grandmother, and paternal grandmother; Hypertension in her father, maternal grandfather, maternal grandmother, and paternal grandmother.  Review of Systems  Constitutional: Negative.   Musculoskeletal: Positive for joint pain. Negative for falls and myalgias.    OBJECTIVE  Blood pressure 112/72, pulse 94, temperature 98.1 F (36.7 C), temperature source Oral, resp. rate 16, height 5\' 5"  (1.651 m), last menstrual period 06/01/2014, SpO2 95.00%.  Physical Exam  Constitutional: She is well-developed, well-nourished, and in no distress.  Eyes: EOM are normal. Pupils are equal, round, and reactive to light.  Musculoskeletal:       Feet:  Neurological: She displays no weakness. She exhibits normal muscle tone. Gait abnormal.  Skin: Skin is  warm and dry.    ASSESSMENT & PLAN  Toe pain, right - Plan: meloxicam (MOBIC) 7.5 MG tablet.  Buddy taping of toe was applied in the office.  Patient advised to try tylenol OTC in addition to Mobic.  The patient was instructed to to call or comeback to clinic as needed, or should symptoms warrant.  Philis Fendt, MS, PA-C [facility] 06/21/2014 8:45 AM

## 2014-09-15 ENCOUNTER — Other Ambulatory Visit: Payer: Self-pay | Admitting: Nurse Practitioner

## 2014-09-15 NOTE — Telephone Encounter (Signed)
Medication refill request: Gildess 1/20 Last AEX:  10/13/13 with Ms. Patty Next AEX: 10/14/14 with Ms. Patty Last MMG (if hormonal medication request): N/A Refill authorized: #[id number] rfs, please advise.

## 2014-10-14 ENCOUNTER — Encounter: Payer: Self-pay | Admitting: Nurse Practitioner

## 2014-10-14 ENCOUNTER — Ambulatory Visit (INDEPENDENT_AMBULATORY_CARE_PROVIDER_SITE_OTHER): Payer: 59 | Admitting: Nurse Practitioner

## 2014-10-14 VITALS — BP 116/80 | HR 80 | Ht 65.0 in | Wt 216.0 lb

## 2014-10-14 DIAGNOSIS — Z0000 Encounter for general adult medical examination without abnormal findings: Secondary | ICD-10-CM

## 2014-10-14 DIAGNOSIS — Z01419 Encounter for gynecological examination (general) (routine) without abnormal findings: Secondary | ICD-10-CM

## 2014-10-14 MED ORDER — NORETHIN ACE-ETH ESTRAD-FE 1-20 MG-MCG PO TABS: 1.0000 | ORAL_TABLET | Freq: Every day | ORAL | Status: DC

## 2014-10-14 NOTE — Patient Instructions (Signed)

## 2014-10-14 NOTE — Progress Notes (Signed)
Patient ID: Tasha Hansen, female   DOB: [birth year], 29 y.o.   MRN: [id number] 29 y.o. G0P0 Single  African American Fe here for annual exam.  Getting married on Jan 16, 2015.  Menses 4-5 days. Moderate to light. May consider pregnancy in 2-3 years.  Same partner since age 25.   Patient's last menstrual period was 10/14/2014.          Sexually active: Yes.    The current method of family planning is OCP (estrogen/progesterone).    Exercising:  Yes, zumba, elliptical and hot yoga 2-3 times per week Smoker:  no  Health Maintenance: Pap:  10/13/13, negative  TDaP:  10/13/13 Gardasil:  Declined in 2015 Labs:  HB:  12.7   Urine:  Negative    reports that she has never smoked. She has never used smokeless tobacco. She reports that she does not drink alcohol or use illicit drugs.  Past Medical History  Diagnosis Date  . No pertinent past medical history   . Allergy     Past Surgical History  Procedure Laterality Date  . Hernia repair  1988    as an infant  . Wisdom tooth extraction  age 33    Current Outpatient Prescriptions  Medication Sig Dispense Refill  . Multiple Vitamin (MULITIVITAMIN WITH MINERALS) TABS Take 1 tablet by mouth daily.    . norethindrone-ethinyl estradiol (GILDESS FE 1/20) 1-20 MG-MCG tablet Take 1 tablet by mouth daily. 84 tablet 3   No current facility-administered medications for this visit.    Family History  Problem Relation Age of Onset  . Hypertension Father   . Hypertension Maternal Grandmother   . Diabetes Maternal Grandmother   . Hypertension Maternal Grandfather   . Diabetes Maternal Grandfather   . Hypertension Paternal Grandmother   . Diabetes Paternal Grandmother     ROS:  Pertinent items are noted in HPI.  Otherwise, a comprehensive ROS was negative.  Exam:   BP 116/80 mmHg  Pulse 80  Ht 5\' 5"  (1.651 m)  Wt 216 lb (97.977 kg)  BMI 35.94 kg/m2  LMP 10/14/2014 Height: 5\' 5"  (165.1 cm) Ht Readings from Last 3 Encounters:  10/14/14 5'  5" (1.651 m)  06/21/14 5\' 5"  (1.651 m)  12/31/13 5\' 5"  (1.651 m)    General appearance: alert, cooperative and appears stated age Head: Normocephalic, without obvious abnormality, atraumatic Neck: no adenopathy, supple, symmetrical, trachea midline and thyroid normal to inspection and palpation Lungs: clear to auscultation bilaterally Breasts: normal appearance, no masses or tenderness Heart: regular rate and rhythm Abdomen: soft, non-tender; no masses,  no organomegaly Extremities: extremities normal, atraumatic, no cyanosis or edema Skin: Skin color, texture, turgor normal. No rashes or lesions Lymph nodes: Cervical, supraclavicular, and axillary nodes normal. No abnormal inguinal nodes palpated Neurologic: Grossly normal   Pelvic: External genitalia:  no lesions              Urethra:  normal appearing urethra with no masses, tenderness or lesions              Bartholin's and Skene's: normal                 Vagina: normal appearing vagina with normal color and discharge, no lesions              Cervix: anteverted              Pap taken: No. Bimanual Exam:  Uterus:  normal size, contour, position, consistency, mobility, non-tender  Adnexa: no mass, fullness, tenderness               Rectovaginal: Confirms               Anus:  normal sphincter tone, no lesions  Chaperone present:  yes  A:  Well Woman with normal exam  OCP for contraception Getting married 01/16/15  P:   Reviewed health and wellness pertinent to exam  Pap smear not taken today  Refill on Gildess Fe for a year  Answered questions about preconceptual counseling when she is ready to have a family.  Counseled on breast self exam, use and side effects of OCP's, adequate intake of calcium and vitamin D, diet and exercise return annually or prn  An After Visit Summary was printed and given to the patient.

## 2014-10-15 LAB — POCT URINALYSIS DIPSTICK
Bilirubin, UA: NEGATIVE
Blood, UA: NEGATIVE
Glucose, UA: NEGATIVE
Ketones, UA: NEGATIVE
Leukocytes, UA: NEGATIVE
Nitrite, UA: NEGATIVE
Protein, UA: NEGATIVE
Urobilinogen, UA: NEGATIVE
pH, UA: 5

## 2014-10-15 LAB — HEMOGLOBIN, FINGERSTICK: Hemoglobin, fingerstick: 12.7 g/dL (ref 12.0–16.0)

## 2014-10-16 NOTE — Progress Notes (Signed)
Reviewed personally.  M. Suzanne Marcele Kosta, MD.  

## 2014-10-29 ENCOUNTER — Telehealth: Payer: Self-pay | Admitting: Nurse Practitioner

## 2014-10-29 ENCOUNTER — Other Ambulatory Visit: Payer: Self-pay | Admitting: Nurse Practitioner

## 2014-10-29 DIAGNOSIS — Z113 Encounter for screening for infections with a predominantly sexual mode of transmission: Secondary | ICD-10-CM

## 2014-10-29 NOTE — Telephone Encounter (Signed)
Patient calling for recent results.

## 2014-10-29 NOTE — Telephone Encounter (Signed)
Spoke with patient. Advised patient of normal hemoglobin level at OV on 2/10. "I thought that I had other lab work done. I specifically asked for additional lab work. I spoke with the lab tech and Patty's assistant about having those added. I can come back in to have them done but it is really inconvenient." Lab appointment scheduled for 2/29 at 10am. Patient is agreeable to date and time. "Am I going to be charged for that visit? I already paid for everything else and this was supposed to be done at that appointment." Requesting lab work for all STD's.Advised patient will need to speak with Milford Cage, FNP and administration and someone will return call in regards to payment. Patient is agreeable.  Milford Cage, FNP please place orders for labs.  Cc: Thayer Ohm in regards to payment

## 2014-10-29 NOTE — Telephone Encounter (Signed)
Left message to call Kaitlyn at [phone]. 

## 2014-10-29 NOTE — Telephone Encounter (Signed)
Colletta Maryland these orders will not go through - not sure what the problem is - please fix if yo can.

## 2014-10-30 NOTE — Telephone Encounter (Signed)
Orders corrected and signed per protocol.

## 2014-10-30 NOTE — Telephone Encounter (Signed)
Returned call to patient. Patient's questions resolved.   Routing to provider for final review. Encounter closed.

## 2014-11-02 ENCOUNTER — Telehealth: Payer: Self-pay | Admitting: Nurse Practitioner

## 2014-11-02 ENCOUNTER — Other Ambulatory Visit: Payer: 59

## 2014-11-02 NOTE — Telephone Encounter (Signed)
Left message to call and reschedule missed lab appointment from today.

## 2014-11-03 ENCOUNTER — Ambulatory Visit: Payer: Self-pay | Admitting: Family Medicine

## 2014-11-03 NOTE — Telephone Encounter (Signed)
Pt called back. Did not wish to reschedule at this time. Will be in touch at later date/time to reschedule.

## 2014-11-04 NOTE — Telephone Encounter (Signed)
FYI

## 2014-12-02 NOTE — Progress Notes (Signed)
Spoke with pt- she plans to have a CPE and labs soon

## 2014-12-11 ENCOUNTER — Ambulatory Visit: Payer: 59 | Admitting: Certified Nurse Midwife

## 2014-12-14 ENCOUNTER — Ambulatory Visit (INDEPENDENT_AMBULATORY_CARE_PROVIDER_SITE_OTHER): Payer: 59 | Admitting: Certified Nurse Midwife

## 2014-12-14 ENCOUNTER — Encounter: Payer: 59 | Admitting: Family Medicine

## 2014-12-14 ENCOUNTER — Encounter: Payer: Self-pay | Admitting: Certified Nurse Midwife

## 2014-12-14 VITALS — BP 104/64 | HR 68 | Temp 98.6°F | Resp 16 | Ht 65.0 in | Wt 217.0 lb

## 2014-12-14 DIAGNOSIS — N390 Urinary tract infection, site not specified: Secondary | ICD-10-CM

## 2014-12-14 DIAGNOSIS — R350 Frequency of micturition: Secondary | ICD-10-CM | POA: Diagnosis not present

## 2014-12-14 DIAGNOSIS — N898 Other specified noninflammatory disorders of vagina: Secondary | ICD-10-CM

## 2014-12-14 LAB — POCT URINALYSIS DIPSTICK
Bilirubin, UA: NEGATIVE
Blood, UA: NEGATIVE
Glucose, UA: NEGATIVE
Ketones, UA: NEGATIVE
Leukocytes, UA: NEGATIVE
Nitrite, UA: NEGATIVE
Protein, UA: NEGATIVE
Urobilinogen, UA: NEGATIVE
pH, UA: 5

## 2014-12-14 NOTE — Progress Notes (Signed)
29 y.o.Single african [place] female [id number] here with complaint of urinary frequency and pain after drinking cafeinated tea or soda only, with onset  on 2 weeks ago. Patient drinks water all the time and noted this occuring with these products only. Also felt like she was having vaginal pain at the same time. Denies STD concerns. Getting married the first of May, concerned everything normal. Patient denies fever, chills, nausea or back pain. No new personal products. Patient feels not related to sexual activity. Denies any vaginal symptoms of itching or burning or increase vaginal discharge. Contraception is OCP.  Patient taking in adequate water intake. Questioned regarding type of pain "feels like spasm, but resolves after emptying bladder". No blood in urine note or history of kidney stones.   O: Healthy female WDWN Affect: Normal, orientation x 3 Skin : warm and dry CVAT: negative bilateral Abdomen: negative for suprapubic tenderness, soft  Pelvic exam: External genital area: normal, no lesions Bladder,Urethra not tender, Urethral meatus: not tender, or red Vagina: normal vaginal discharge, normal appearance,  Affirm taken Cervix: normal, non tender, negative CMT Uterus:normal,non tender Adnexa: normal non tender, no fullness or masses   A: Bladder sensitivity with caffeine use Normal pelvic exam with no indications or UTI or vaginitis  P: Reviewed findings of normal exam and no indication for  treatment. Try limited decaffeinated product( one) to see if occurs and increase water after use. Would encourage to avoid as ,she may just have sensitivity. Patient feels she can do this. Feels reassured no obvious problems.  SFK:CLEXN culture Affirm Reviewed warning signs and symptoms of UTI and need to advise if occurring.  Will treat if indications from labs   RV prn

## 2014-12-14 NOTE — Patient Instructions (Signed)
Asymptomatic Bacteriuria Asymptomatic bacteriuria is the presence of a large number of bacteria in your urine without the usual symptoms of burning or frequent urination. The following conditions increase the risk of asymptomatic bacteriuria:  Diabetes mellitus.  Advanced age.  Pregnancy in the first trimester.  Kidney stones.  Kidney transplants.  Leaky kidney tube valve in young children (reflux). Treatment for this condition is not needed in most people and can lead to other problems such as too much yeast and growth of resistant bacteria. However, some people, such as pregnant women, do need treatment to prevent kidney infection. Asymptomatic bacteriuria in pregnancy is also associated with fetal growth restriction, premature labor, and newborn death. HOME CARE INSTRUCTIONS Monitor your condition for any changes. The following actions may help to relieve any discomfort you are feeling:  Drink enough water and fluids to keep your urine clear or pale yellow. Go to the bathroom more often to keep your bladder empty.  Keep the area around your vagina and rectum clean. Wipe yourself from front to back after urinating. SEEK IMMEDIATE MEDICAL CARE IF:  You develop signs of an infection such as:  Burning with urination.  Frequency of voiding.  Back pain.  Fever.  You have blood in the urine.  You develop a fever. MAKE SURE YOU:  Understand these instructions.  Will watch your condition.  Will get help right away if you are not doing well or get worse. Document Released: 08/21/2005 Document Revised: 01/05/2014 Document Reviewed: 02/10/2013 ExitCare Patient Information 2015 [employer]. This information is not intended to replace advice given to you by your health care provider. Make sure you discuss any questions you have with your health care provider.  

## 2014-12-15 ENCOUNTER — Other Ambulatory Visit: Payer: Self-pay | Admitting: Certified Nurse Midwife

## 2014-12-15 DIAGNOSIS — N760 Acute vaginitis: Secondary | ICD-10-CM

## 2014-12-15 DIAGNOSIS — B9689 Other specified bacterial agents as the cause of diseases classified elsewhere: Secondary | ICD-10-CM

## 2014-12-15 LAB — WET PREP BY MOLECULAR PROBE
Candida species: NEGATIVE
Gardnerella vaginalis: POSITIVE — AB
Trichomonas vaginosis: NEGATIVE

## 2014-12-15 LAB — URINE CULTURE: Colony Count: 70000

## 2014-12-15 MED ORDER — CLINDAMYCIN PHOSPHATE 2 % VA CREA: TOPICAL_CREAM | VAGINAL | Status: DC

## 2014-12-16 ENCOUNTER — Telehealth: Payer: Self-pay | Admitting: Certified Nurse Midwife

## 2014-12-16 NOTE — Telephone Encounter (Signed)
Left detailed message letting pt know her urine culture results that I had previously left. Pt told to callback tomorrow if any questions

## 2014-12-16 NOTE — Telephone Encounter (Signed)
Patient is returning a call to Joy. °

## 2014-12-20 NOTE — Progress Notes (Signed)
Reviewed personally.  M. Suzanne Dorenda Pfannenstiel, MD.  

## 2015-03-05 ENCOUNTER — Ambulatory Visit: Payer: 59 | Admitting: Family Medicine

## 2015-08-13 ENCOUNTER — Ambulatory Visit: Payer: 59 | Admitting: Nurse Practitioner

## 2015-09-20 DIAGNOSIS — Z01419 Encounter for gynecological examination (general) (routine) without abnormal findings: Secondary | ICD-10-CM | POA: Diagnosis not present

## 2015-09-24 ENCOUNTER — Encounter: Payer: Self-pay | Admitting: Family Medicine

## 2015-09-29 ENCOUNTER — Encounter: Payer: Self-pay | Admitting: Family Medicine

## 2015-10-19 ENCOUNTER — Ambulatory Visit: Payer: 59 | Admitting: Nurse Practitioner

## 2015-10-19 DIAGNOSIS — H52223 Regular astigmatism, bilateral: Secondary | ICD-10-CM | POA: Diagnosis not present

## 2016-02-24 DIAGNOSIS — R590 Localized enlarged lymph nodes: Secondary | ICD-10-CM | POA: Diagnosis not present

## 2016-07-05 DIAGNOSIS — J029 Acute pharyngitis, unspecified: Secondary | ICD-10-CM | POA: Diagnosis not present

## 2016-07-31 DIAGNOSIS — E669 Obesity, unspecified: Secondary | ICD-10-CM | POA: Diagnosis not present

## 2016-07-31 DIAGNOSIS — Z6838 Body mass index (BMI) 38.0-38.9, adult: Secondary | ICD-10-CM | POA: Diagnosis not present

## 2016-07-31 DIAGNOSIS — Z713 Dietary counseling and surveillance: Secondary | ICD-10-CM | POA: Diagnosis not present

## 2016-07-31 DIAGNOSIS — Z0000 Encounter for general adult medical examination without abnormal findings: Secondary | ICD-10-CM | POA: Diagnosis not present

## 2016-10-05 DIAGNOSIS — Z01419 Encounter for gynecological examination (general) (routine) without abnormal findings: Secondary | ICD-10-CM | POA: Diagnosis not present

## 2016-10-05 DIAGNOSIS — Z13220 Encounter for screening for lipoid disorders: Secondary | ICD-10-CM | POA: Diagnosis not present

## 2016-10-05 DIAGNOSIS — Z6838 Body mass index (BMI) 38.0-38.9, adult: Secondary | ICD-10-CM | POA: Diagnosis not present

## 2016-10-05 DIAGNOSIS — Z113 Encounter for screening for infections with a predominantly sexual mode of transmission: Secondary | ICD-10-CM | POA: Diagnosis not present

## 2016-10-05 DIAGNOSIS — Z3169 Encounter for other general counseling and advice on procreation: Secondary | ICD-10-CM | POA: Diagnosis not present

## 2016-10-05 DIAGNOSIS — Z1159 Encounter for screening for other viral diseases: Secondary | ICD-10-CM | POA: Diagnosis not present

## 2016-10-05 DIAGNOSIS — Z114 Encounter for screening for human immunodeficiency virus [HIV]: Secondary | ICD-10-CM | POA: Diagnosis not present

## 2016-10-05 DIAGNOSIS — Z131 Encounter for screening for diabetes mellitus: Secondary | ICD-10-CM | POA: Diagnosis not present

## 2016-10-18 DIAGNOSIS — R109 Unspecified abdominal pain: Secondary | ICD-10-CM | POA: Diagnosis not present

## 2016-10-18 DIAGNOSIS — N760 Acute vaginitis: Secondary | ICD-10-CM | POA: Diagnosis not present

## 2016-12-05 DIAGNOSIS — H52223 Regular astigmatism, bilateral: Secondary | ICD-10-CM | POA: Diagnosis not present

## 2017-05-03 DIAGNOSIS — J028 Acute pharyngitis due to other specified organisms: Secondary | ICD-10-CM | POA: Diagnosis not present

## 2017-05-03 DIAGNOSIS — J029 Acute pharyngitis, unspecified: Secondary | ICD-10-CM | POA: Diagnosis not present

## 2017-05-03 DIAGNOSIS — R110 Nausea: Secondary | ICD-10-CM | POA: Diagnosis not present

## 2017-05-12 DIAGNOSIS — J0101 Acute recurrent maxillary sinusitis: Secondary | ICD-10-CM | POA: Diagnosis not present

## 2017-08-20 DIAGNOSIS — Z3201 Encounter for pregnancy test, result positive: Secondary | ICD-10-CM | POA: Diagnosis not present

## 2017-09-04 NOTE — L&D Delivery Note (Signed)
Delivery Note At 9:28 PM a viable female was delivered via Vaginal, Spontaneous (Presentation: DOA;  ).  APGAR: 7, 9; weight 7 lb 9.3 oz (3439 g).   Placenta status: ,spontaneous, intact .  Cord: 3VC  with the following complications: none.  Cord pH: n/a  Pt pushed about 2 hrs, delivery of vtx, "turtle sign", prepped team for possible dystocia  But with gentle downward pressure, right/ anterior shoulder delivered easily, remainder of baby followed.  Anesthesia:epidural   Episiotomy: None Lacerations: 2nd degree Suture Repair: 3.0 vicryl rapide Est. Blood Loss (mL): 500  Aggressive bleeding pP, straight cath for 150cc urine, fundal massage, cvx explored and intact, vaginal laceration already repaired while awaiting placental delivery. 1g IV TXA and 844mcg rectal cytotec. Good uterine tone and cessation of bleeding.   Mom to postpartum.  Baby to Couplet care / Skin to Skin.  [name] 04/15/2018, 10:46 PM

## 2017-09-12 DIAGNOSIS — Z3689 Encounter for other specified antenatal screening: Secondary | ICD-10-CM | POA: Diagnosis not present

## 2017-09-12 DIAGNOSIS — O200 Threatened abortion: Secondary | ICD-10-CM | POA: Diagnosis not present

## 2017-09-12 DIAGNOSIS — Z3401 Encounter for supervision of normal first pregnancy, first trimester: Secondary | ICD-10-CM | POA: Diagnosis not present

## 2017-09-12 DIAGNOSIS — Z118 Encounter for screening for other infectious and parasitic diseases: Secondary | ICD-10-CM | POA: Diagnosis not present

## 2017-09-12 DIAGNOSIS — Z3A10 10 weeks gestation of pregnancy: Secondary | ICD-10-CM | POA: Diagnosis not present

## 2017-09-12 LAB — OB RESULTS CONSOLE ABO/RH: RH Type: POSITIVE

## 2017-09-12 LAB — OB RESULTS CONSOLE HIV ANTIBODY (ROUTINE TESTING): HIV: NONREACTIVE

## 2017-09-12 LAB — OB RESULTS CONSOLE HEPATITIS B SURFACE ANTIGEN: Hepatitis B Surface Ag: NEGATIVE

## 2017-09-12 LAB — OB RESULTS CONSOLE ANTIBODY SCREEN: Antibody Screen: NEGATIVE

## 2017-09-12 LAB — OB RESULTS CONSOLE GC/CHLAMYDIA
Chlamydia: NEGATIVE
Gonorrhea: NEGATIVE

## 2017-09-12 LAB — OB RESULTS CONSOLE RUBELLA ANTIBODY, IGM: Rubella: IMMUNE

## 2017-09-12 LAB — OB RESULTS CONSOLE RPR: RPR: NONREACTIVE

## 2017-10-26 DIAGNOSIS — Z3402 Encounter for supervision of normal first pregnancy, second trimester: Secondary | ICD-10-CM | POA: Diagnosis not present

## 2017-10-26 DIAGNOSIS — Z361 Encounter for antenatal screening for raised alphafetoprotein level: Secondary | ICD-10-CM | POA: Diagnosis not present

## 2017-11-01 DIAGNOSIS — Z3A16 16 weeks gestation of pregnancy: Secondary | ICD-10-CM | POA: Diagnosis not present

## 2017-11-01 DIAGNOSIS — O351XX0 Maternal care for (suspected) chromosomal abnormality in fetus, not applicable or unspecified: Secondary | ICD-10-CM | POA: Diagnosis not present

## 2017-11-08 DIAGNOSIS — Z3A18 18 weeks gestation of pregnancy: Secondary | ICD-10-CM | POA: Diagnosis not present

## 2017-11-08 DIAGNOSIS — R350 Frequency of micturition: Secondary | ICD-10-CM | POA: Diagnosis not present

## 2017-11-08 DIAGNOSIS — O26891 Other specified pregnancy related conditions, first trimester: Secondary | ICD-10-CM | POA: Diagnosis not present

## 2017-11-08 DIAGNOSIS — N898 Other specified noninflammatory disorders of vagina: Secondary | ICD-10-CM | POA: Diagnosis not present

## 2017-11-08 DIAGNOSIS — O26899 Other specified pregnancy related conditions, unspecified trimester: Secondary | ICD-10-CM | POA: Diagnosis not present

## 2017-11-12 DIAGNOSIS — Z3686 Encounter for antenatal screening for cervical length: Secondary | ICD-10-CM | POA: Diagnosis not present

## 2017-11-12 DIAGNOSIS — Z363 Encounter for antenatal screening for malformations: Secondary | ICD-10-CM | POA: Diagnosis not present

## 2017-11-27 DIAGNOSIS — Z362 Encounter for other antenatal screening follow-up: Secondary | ICD-10-CM | POA: Diagnosis not present

## 2017-12-18 DIAGNOSIS — Z362 Encounter for other antenatal screening follow-up: Secondary | ICD-10-CM | POA: Diagnosis not present

## 2018-01-16 DIAGNOSIS — Z3689 Encounter for other specified antenatal screening: Secondary | ICD-10-CM | POA: Diagnosis not present

## 2018-02-01 DIAGNOSIS — Z3403 Encounter for supervision of normal first pregnancy, third trimester: Secondary | ICD-10-CM | POA: Diagnosis not present

## 2018-02-01 DIAGNOSIS — Z3689 Encounter for other specified antenatal screening: Secondary | ICD-10-CM | POA: Diagnosis not present

## 2018-02-01 DIAGNOSIS — Z23 Encounter for immunization: Secondary | ICD-10-CM | POA: Diagnosis not present

## 2018-03-12 DIAGNOSIS — O99013 Anemia complicating pregnancy, third trimester: Secondary | ICD-10-CM | POA: Diagnosis not present

## 2018-03-12 DIAGNOSIS — Z3A34 34 weeks gestation of pregnancy: Secondary | ICD-10-CM | POA: Diagnosis not present

## 2018-03-26 DIAGNOSIS — Z3685 Encounter for antenatal screening for Streptococcus B: Secondary | ICD-10-CM | POA: Diagnosis not present

## 2018-03-26 DIAGNOSIS — O99013 Anemia complicating pregnancy, third trimester: Secondary | ICD-10-CM | POA: Diagnosis not present

## 2018-03-26 DIAGNOSIS — Z3A36 36 weeks gestation of pregnancy: Secondary | ICD-10-CM | POA: Diagnosis not present

## 2018-03-26 LAB — OB RESULTS CONSOLE GBS: GBS: NEGATIVE

## 2018-04-04 ENCOUNTER — Inpatient Hospital Stay (HOSPITAL_COMMUNITY)
Admission: AD | Admit: 2018-04-04 | Discharge: 2018-04-05 | Disposition: A | Discharge status: Home / Self Care | Payer: 59 | Source: Ambulatory Visit | Attending: Obstetrics and Gynecology | Admitting: Obstetrics and Gynecology

## 2018-04-04 ENCOUNTER — Encounter (HOSPITAL_COMMUNITY): Payer: Self-pay

## 2018-04-04 DIAGNOSIS — Z3A38 38 weeks gestation of pregnancy: Secondary | ICD-10-CM | POA: Insufficient documentation

## 2018-04-04 HISTORY — DX: Gastro-esophageal reflux disease without esophagitis: K21.9

## 2018-04-04 NOTE — MAU Note (Signed)
CTX every 15 minutes.  No LOF.  Some spotting since her cervical exam. +FM.  Was 3 cm yesterday in the office.  GBS -

## 2018-04-05 DIAGNOSIS — Z3A38 38 weeks gestation of pregnancy: Secondary | ICD-10-CM | POA: Diagnosis not present

## 2018-04-08 ENCOUNTER — Encounter (HOSPITAL_COMMUNITY): Payer: Self-pay | Admitting: *Deleted

## 2018-04-08 ENCOUNTER — Telehealth (HOSPITAL_COMMUNITY): Payer: Self-pay | Admitting: *Deleted

## 2018-04-08 NOTE — Telephone Encounter (Signed)
Preadmission screen  

## 2018-04-12 ENCOUNTER — Other Ambulatory Visit: Payer: Self-pay | Admitting: Obstetrics

## 2018-04-14 NOTE — H&P (Signed)
Tasha Hansen is a 32 y.o. G1P0 at [redacted]w[redacted]d presenting for IOL due to advanced cervical dilation. Pt notes rare contractions. Good fetal movement, No vaginal bleeding, not leaking fluid.  [facility] at [facility] since 9 wks - Dated by 9 wk u/s - Anemia, on iron - GERD, on Zantac - Abnl quad screen- 1:130 risk Down Syndrome, nl AFP, nl Informaseq - obesity, 20# weight gain - fibroids - fetal growth- 37 wks: 6'12, 66%, ant placenta   Prenatal Transfer Tool  Maternal Diabetes: No Genetic Screening: abn; quad followed by nl Informaseq Maternal Ultrasounds/Referrals: Normal Fetal Ultrasounds or other Referrals:  None Maternal Substance Abuse:  No Significant Maternal Medications:  None Significant Maternal Lab Results: None     OB History    Gravida  1   Para  0   Term      Preterm      AB      Living        SAB      TAB      Ectopic      Multiple      Live Births             Past Medical History:  Diagnosis Date  . Allergy   . Anemia   . GERD (gastroesophageal reflux disease)   . No pertinent past medical history    Past Surgical History:  Procedure Laterality Date  . [place]   as an infant  . WISDOM TOOTH EXTRACTION  age 23   Family History: family history includes Diabetes in her maternal grandfather, maternal grandmother, paternal grandfather, and paternal grandmother; Hypertension in her father, maternal grandfather, maternal grandmother, paternal grandfather, and paternal grandmother. Social History:  reports that she has never smoked. She has never used smokeless tobacco. She reports that she does not drink alcohol or use drugs.  Review of Systems - Negative except dscomfort of pregnancy  Physical Exam:  Vitals:   04/15/18 1135 04/15/18 1203 04/15/18 1242 04/15/18 1331  BP: 122/73 115/74 127/81 115/77  Pulse: 93 92 90 95  Resp:      Temp:      TempSrc:      Weight:      Height:        Gen: well appearing, no  distress Abd: gravid, NT, no RUQ pain LE: no edema, equal bilaterally, non-tender Toco: q5  FH: baseline 135s, accelerations present, no deceleratons, 10 beat variability  Prenatal labs: ABO, Rh: B/Positive/-- (01/09 0000) Antibody: Negative (01/09 0000) Rubella: Immune (01/09 0000) RPR: Nonreactive (01/09 0000)  HBsAg: Negative (01/09 0000)  HIV: Non-reactive (01/09 0000)  GBS: Negative (07/23 0000)  1 hr Glucola 89  Genetic screening quad with 1:130 risk DS, nl AFP, nl Informaseq Anatomy US normal  CBC    Component Value Date/Time   WBC 7.4 04/15/2018 0735   RBC 3.68 (L) 04/15/2018 0735   HGB 10.7 (L) 04/15/2018 0735   HGB 12.7 10/14/2014 1049   HCT 31.9 (L) 04/15/2018 0735   PLT 277 04/15/2018 0735   MCV 86.7 04/15/2018 0735   MCH 29.1 04/15/2018 0735   MCHC 33.5 04/15/2018 0735   RDW 15.6 (H) 04/15/2018 0735   LYMPHSABS 2.2 10/27/2013 0951   MONOABS 0.5 10/27/2013 0951   EOSABS 0.1 10/27/2013 0951   BASOSABS 0.0 10/27/2013 0951      Assessment/Plan: 32 y.o. G1P0 at [redacted]w[redacted]d - Advanced cervical dilation, 5cm/ 80% effaced in office. Pt concerned about delivery, will bring in  for elective IOL. Plan pitocin then AROM when in active labor - GBS neg - fibroids   [name] 04/15/2018 2:09 PM

## 2018-04-15 ENCOUNTER — Inpatient Hospital Stay (HOSPITAL_COMMUNITY): Payer: 59 | Admitting: Anesthesiology

## 2018-04-15 ENCOUNTER — Inpatient Hospital Stay (HOSPITAL_COMMUNITY)
Admission: RE | Admit: 2018-04-15 | Discharge: 2018-04-17 | DRG: 807 | Disposition: A | Discharge status: Home / Self Care | Payer: 59 | Attending: Obstetrics | Admitting: Obstetrics

## 2018-04-15 ENCOUNTER — Other Ambulatory Visit: Payer: Self-pay

## 2018-04-15 ENCOUNTER — Encounter (HOSPITAL_COMMUNITY): Payer: Self-pay

## 2018-04-15 DIAGNOSIS — O99214 Obesity complicating childbirth: Secondary | ICD-10-CM | POA: Diagnosis present

## 2018-04-15 DIAGNOSIS — D259 Leiomyoma of uterus, unspecified: Secondary | ICD-10-CM | POA: Diagnosis present

## 2018-04-15 DIAGNOSIS — O3413 Maternal care for benign tumor of corpus uteri, third trimester: Secondary | ICD-10-CM | POA: Diagnosis present

## 2018-04-15 DIAGNOSIS — O26893 Other specified pregnancy related conditions, third trimester: Secondary | ICD-10-CM | POA: Diagnosis present

## 2018-04-15 DIAGNOSIS — D509 Iron deficiency anemia, unspecified: Secondary | ICD-10-CM | POA: Diagnosis present

## 2018-04-15 DIAGNOSIS — O1205 Gestational edema, complicating the puerperium: Secondary | ICD-10-CM | POA: Diagnosis present

## 2018-04-15 DIAGNOSIS — K219 Gastro-esophageal reflux disease without esophagitis: Secondary | ICD-10-CM | POA: Diagnosis present

## 2018-04-15 DIAGNOSIS — Z3490 Encounter for supervision of normal pregnancy, unspecified, unspecified trimester: Secondary | ICD-10-CM | POA: Diagnosis present

## 2018-04-15 DIAGNOSIS — O9962 Diseases of the digestive system complicating childbirth: Secondary | ICD-10-CM | POA: Diagnosis present

## 2018-04-15 DIAGNOSIS — O9902 Anemia complicating childbirth: Principal | ICD-10-CM | POA: Diagnosis present

## 2018-04-15 DIAGNOSIS — Z3A39 39 weeks gestation of pregnancy: Secondary | ICD-10-CM | POA: Diagnosis not present

## 2018-04-15 LAB — ABO/RH: ABO/RH(D): B POS

## 2018-04-15 LAB — TYPE AND SCREEN
ABO/RH(D): B POS
Antibody Screen: NEGATIVE
Weak D: POSITIVE

## 2018-04-15 LAB — CBC
HCT: 31.9 % — ABNORMAL LOW (ref 36.0–46.0)
Hemoglobin: 10.7 g/dL — ABNORMAL LOW (ref 12.0–15.0)
MCH: 29.1 pg (ref 26.0–34.0)
MCHC: 33.5 g/dL (ref 30.0–36.0)
MCV: 86.7 fL (ref 78.0–100.0)
Platelets: 277 10*3/uL (ref 150–400)
RBC: 3.68 MIL/uL — ABNORMAL LOW (ref 3.87–5.11)
RDW: 15.6 % — ABNORMAL HIGH (ref 11.5–15.5)
WBC: 7.4 10*3/uL (ref 4.0–10.5)

## 2018-04-15 MED ORDER — EPHEDRINE 5 MG/ML INJ
10.0000 mg | INTRAVENOUS | Status: DC | PRN
  Filled 2018-04-15: qty 2

## 2018-04-15 MED ORDER — DIBUCAINE 1 % RE OINT: 1.0000 "application " | TOPICAL_OINTMENT | RECTAL | Status: DC | PRN

## 2018-04-15 MED ORDER — WITCH HAZEL-GLYCERIN EX PADS: 1.0000 "application " | MEDICATED_PAD | CUTANEOUS | Status: DC | PRN

## 2018-04-15 MED ORDER — ACETAMINOPHEN 325 MG PO TABS: 650.0000 mg | ORAL_TABLET | ORAL | Status: DC | PRN

## 2018-04-15 MED ORDER — ONDANSETRON HCL 4 MG/2ML IJ SOLN
4.0000 mg | Freq: Four times a day (QID) | INTRAMUSCULAR | Status: DC | PRN
  Administered 2018-04-15: 4 mg via INTRAVENOUS
  Filled 2018-04-15: qty 2

## 2018-04-15 MED ORDER — MISOPROSTOL 200 MCG PO TABS: 800.0000 ug | ORAL_TABLET | Freq: Once | ORAL | Status: AC

## 2018-04-15 MED ORDER — SIMETHICONE 80 MG PO CHEW
80.0000 mg | CHEWABLE_TABLET | ORAL | Status: DC | PRN
  Filled 2018-04-15: qty 1

## 2018-04-15 MED ORDER — OXYCODONE HCL 5 MG PO TABS
5.0000 mg | ORAL_TABLET | ORAL | Status: DC | PRN
  Administered 2018-04-16: 5 mg via ORAL
  Filled 2018-04-15: qty 1

## 2018-04-15 MED ORDER — PRENATAL MULTIVITAMIN CH
1.0000 | ORAL_TABLET | Freq: Every day | ORAL | Status: DC
  Administered 2018-04-16 – 2018-04-17 (×2): 1 via ORAL
  Filled 2018-04-15 (×2): qty 1

## 2018-04-15 MED ORDER — TERBUTALINE SULFATE 1 MG/ML IJ SOLN
0.2500 mg | Freq: Once | INTRAMUSCULAR | Status: DC | PRN
  Filled 2018-04-15: qty 1

## 2018-04-15 MED ORDER — LACTATED RINGERS IV SOLN
INTRAVENOUS | Status: DC
  Administered 2018-04-15 (×3): via INTRAVENOUS

## 2018-04-15 MED ORDER — LIDOCAINE HCL (PF) 1 % IJ SOLN
INTRAMUSCULAR | Status: AC
  Filled 2018-04-15: qty 30

## 2018-04-15 MED ORDER — LACTATED RINGERS IV SOLN: 500.0000 mL | INTRAVENOUS | Status: DC | PRN

## 2018-04-15 MED ORDER — MISOPROSTOL 200 MCG PO TABS
ORAL_TABLET | ORAL | Status: AC
  Filled 2018-04-15: qty 4

## 2018-04-15 MED ORDER — BENZOCAINE-MENTHOL 20-0.5 % EX AERO
1.0000 "application " | INHALATION_SPRAY | CUTANEOUS | Status: DC | PRN
  Administered 2018-04-16: 1 via TOPICAL
  Filled 2018-04-15: qty 56

## 2018-04-15 MED ORDER — LACTATED RINGERS IV SOLN: 500.0000 mL | Freq: Once | INTRAVENOUS | Status: DC

## 2018-04-15 MED ORDER — FENTANYL 2.5 MCG/ML BUPIVACAINE 1/10 % EPIDURAL INFUSION (WH - ANES)
14.0000 mL/h | INTRAMUSCULAR | Status: DC | PRN
  Administered 2018-04-15: 14 mL/h via EPIDURAL
  Filled 2018-04-15: qty 100

## 2018-04-15 MED ORDER — OXYTOCIN BOLUS FROM INFUSION
500.0000 mL | Freq: Once | INTRAVENOUS | Status: AC
  Administered 2018-04-15: 500 mL via INTRAVENOUS

## 2018-04-15 MED ORDER — OXYTOCIN 40 UNITS IN LACTATED RINGERS INFUSION - SIMPLE MED
1.0000 m[IU]/min | INTRAVENOUS | Status: DC
  Administered 2018-04-15: 2 m[IU]/min via INTRAVENOUS
  Filled 2018-04-15: qty 1000

## 2018-04-15 MED ORDER — SENNOSIDES-DOCUSATE SODIUM 8.6-50 MG PO TABS
2.0000 | ORAL_TABLET | ORAL | Status: DC
  Administered 2018-04-16: 2 via ORAL
  Filled 2018-04-15 (×2): qty 2

## 2018-04-15 MED ORDER — OXYTOCIN 40 UNITS IN LACTATED RINGERS INFUSION - SIMPLE MED: 2.5000 [IU]/h | INTRAVENOUS | Status: DC

## 2018-04-15 MED ORDER — PHENYLEPHRINE 40 MCG/ML (10ML) SYRINGE FOR IV PUSH (FOR BLOOD PRESSURE SUPPORT)
80.0000 ug | PREFILLED_SYRINGE | INTRAVENOUS | Status: DC | PRN
  Filled 2018-04-15: qty 5
  Filled 2018-04-15: qty 10

## 2018-04-15 MED ORDER — COCONUT OIL OIL: 1.0000 "application " | TOPICAL_OIL | Status: DC | PRN

## 2018-04-15 MED ORDER — ONDANSETRON HCL 4 MG/2ML IJ SOLN: 4.0000 mg | INTRAMUSCULAR | Status: DC | PRN

## 2018-04-15 MED ORDER — VITAMIN K1 1 MG/0.5ML IJ SOLN
INTRAMUSCULAR | Status: AC
  Filled 2018-04-15: qty 0.5

## 2018-04-15 MED ORDER — MISOPROSTOL 200 MCG PO TABS
800.0000 ug | ORAL_TABLET | Freq: Once | ORAL | Status: AC
  Administered 2018-04-15: 800 ug via RECTAL

## 2018-04-15 MED ORDER — ZOLPIDEM TARTRATE 5 MG PO TABS: 5.0000 mg | ORAL_TABLET | Freq: Every evening | ORAL | Status: DC | PRN

## 2018-04-15 MED ORDER — ONDANSETRON HCL 4 MG PO TABS: 4.0000 mg | ORAL_TABLET | ORAL | Status: DC | PRN

## 2018-04-15 MED ORDER — TRANEXAMIC ACID 1000 MG/10ML IV SOLN
1000.0000 mg | INTRAVENOUS | Status: AC
  Administered 2018-04-15: 1000 mg via INTRAVENOUS

## 2018-04-15 MED ORDER — IBUPROFEN 600 MG PO TABS
600.0000 mg | ORAL_TABLET | Freq: Four times a day (QID) | ORAL | Status: DC
  Administered 2018-04-16 – 2018-04-17 (×6): 600 mg via ORAL
  Filled 2018-04-15 (×7): qty 1

## 2018-04-15 MED ORDER — PHENYLEPHRINE 40 MCG/ML (10ML) SYRINGE FOR IV PUSH (FOR BLOOD PRESSURE SUPPORT)
80.0000 ug | PREFILLED_SYRINGE | INTRAVENOUS | Status: DC | PRN
  Filled 2018-04-15: qty 5

## 2018-04-15 MED ORDER — SOD CITRATE-CITRIC ACID 500-334 MG/5ML PO SOLN: 30.0000 mL | ORAL | Status: DC | PRN

## 2018-04-15 MED ORDER — OXYCODONE HCL 5 MG PO TABS: 10.0000 mg | ORAL_TABLET | ORAL | Status: DC | PRN

## 2018-04-15 MED ORDER — LIDOCAINE HCL (PF) 1 % IJ SOLN
INTRAMUSCULAR | Status: DC | PRN
  Administered 2018-04-15 (×2): 5 mL via EPIDURAL

## 2018-04-15 MED ORDER — ACETAMINOPHEN 325 MG PO TABS
650.0000 mg | ORAL_TABLET | ORAL | Status: DC | PRN
  Administered 2018-04-16 (×2): 650 mg via ORAL
  Filled 2018-04-15 (×2): qty 2

## 2018-04-15 MED ORDER — DIPHENHYDRAMINE HCL 50 MG/ML IJ SOLN: 12.5000 mg | INTRAMUSCULAR | Status: DC | PRN

## 2018-04-15 MED ORDER — SODIUM CHLORIDE 0.9 % IV SOLN
INTRAVENOUS | Status: AC
  Administered 2018-04-15: 1000 mg via INTRAVENOUS
  Filled 2018-04-15: qty 10

## 2018-04-15 MED ORDER — TETANUS-DIPHTH-ACELL PERTUSSIS 5-2.5-18.5 LF-MCG/0.5 IM SUSP: 0.5000 mL | Freq: Once | INTRAMUSCULAR | Status: DC

## 2018-04-15 NOTE — Progress Notes (Signed)
S: Doing well, no complaints, pain well controlled not needing epidural  O: BP 115/77   Pulse 95   Temp 98.6 F (37 C) (Oral)   Resp 18   Ht 5\' 5"  (1.651 m)   Wt 116.2 kg   BMI 42.62 kg/m    FHT:  FHR: 135s bpm, variability: moderate,  accelerations:  Present,  decelerations:  Absent UC:   irregular, every 5 minutes SVE:   Dilation: 5 Effacement (%): 80 Station: -2 Exam by:: Meldon Hanzlik  AROM, clear, small return  A / P:  32 y.o.  OB History  Gravida Para Term Preterm AB Living  1 0 0 0 0 0  SAB TAB Ectopic Multiple Live Births  0 0 0 0 0   at [redacted]w[redacted]d IOL for advanced cervical dilation. continue pitocin, AROM now, not yet in active labor  Fetal Wellbeing:  Category I Pain Control:  Labor support without medications  Anticipated MOD:  NSVD  [name] 04/15/2018, 2:10 PM

## 2018-04-15 NOTE — Anesthesia Procedure Notes (Signed)
Epidural Patient location during procedure: OB  Staffing Anesthesiologist: Ota Ebersole, MD Performed: anesthesiologist   Preanesthetic Checklist Completed: patient identified, site marked, surgical consent, pre-op evaluation, timeout performed, IV checked, risks and benefits discussed and monitors and equipment checked  Epidural Patient position: sitting Prep: DuraPrep Patient monitoring: heart rate, continuous pulse ox and blood pressure Approach: right paramedian Location: L3-L4 Injection technique: LOR saline  Needle:  Needle type: Tuohy  Needle gauge: 17 G Needle length: 9 cm and 9 Needle insertion depth: 7 cm Catheter type: closed end flexible Catheter size: 20 Guage Catheter at skin depth: 12 cm Test dose: negative  Assessment Events: blood not aspirated, injection not painful, no injection resistance, negative IV test and no paresthesia  Additional Notes Patient identified. Risks/Benefits/Options discussed with patient including but not limited to bleeding, infection, nerve damage, paralysis, failed block, incomplete pain control, headache, blood pressure changes, nausea, vomiting, reactions to medication both or allergic, itching and postpartum back pain. Confirmed with bedside nurse the patient's most recent platelet count. Confirmed with patient that they are not currently taking any anticoagulation, have any bleeding history or any family history of bleeding disorders. Patient expressed understanding and wished to proceed. All questions were answered. Sterile technique was used throughout the entire procedure. Please see nursing notes for vital signs. Test dose was given through epidural needle and negative prior to continuing to dose epidural or start infusion. Warning signs of high block given to the patient including shortness of breath, tingling/numbness in hands, complete motor block, or any concerning symptoms with instructions to call for help. Patient was given  instructions on fall risk and not to get out of bed. All questions and concerns addressed with instructions to call with any issues.     

## 2018-04-15 NOTE — H&P (Signed)
See H& P.

## 2018-04-15 NOTE — Anesthesia Pain Management Evaluation Note (Signed)
  CRNA Pain Management Visit Note  Patient: Tasha Hansen, 32 y.o., female  "Hello I am a member of the anesthesia team at [facility]. We have an anesthesia team available at all times to provide care throughout the hospital, including epidural management and anesthesia for C-section. I don't know your plan for the delivery whether it a natural birth, water birth, IV sedation, nitrous supplementation, doula or epidural, but we want to meet your pain goals."   1.Was your pain managed to your expectations on prior hospitalizations?   No prior hospitalizations  2.What is your expectation for pain management during this hospitalization?     Epidural  3.How can we help you reach that goal? unsure  Record the patient's initial score and the patient's pain goal.   Pain: 1  Pain Goal: 4 [facility] wants you to be able to say your pain was always managed very well.  Casimer Lanius 04/15/2018

## 2018-04-15 NOTE — Anesthesia Preprocedure Evaluation (Signed)
Anesthesia Evaluation  Patient identified by MRN, date of birth, ID band Patient awake    Reviewed: Allergy & Precautions, H&P , NPO status , Patient's Chart, lab work & pertinent test results  History of Anesthesia Complications Negative for: history of anesthetic complications  Airway Mallampati: II  TM Distance: >3 FB Neck ROM: full    Dental no notable dental hx. (+) Teeth Intact   Pulmonary neg pulmonary ROS,    Pulmonary exam normal breath sounds clear to auscultation       Cardiovascular negative cardio ROS Normal cardiovascular exam Rhythm:regular Rate:Normal     Neuro/Psych negative neurological ROS  negative psych ROS   GI/Hepatic negative GI ROS, Neg liver ROS,   Endo/Other  Morbid obesity  Renal/GU negative Renal ROS  negative genitourinary   Musculoskeletal   Abdominal   Peds  Hematology negative hematology ROS (+)   Anesthesia Other Findings   Reproductive/Obstetrics (+) Pregnancy                             Anesthesia Physical Anesthesia Plan  ASA: III  Anesthesia Plan: Epidural   Post-op Pain Management:    Induction:   PONV Risk Score and Plan:   Airway Management Planned:   Additional Equipment:   Intra-op Plan:   Post-operative Plan:   Informed Consent: I have reviewed the patients History and Physical, chart, labs and discussed the procedure including the risks, benefits and alternatives for the proposed anesthesia with the patient or authorized representative who has indicated his/her understanding and acceptance.     Plan Discussed with:   Anesthesia Plan Comments:         Anesthesia Quick Evaluation

## 2018-04-16 LAB — CBC
HCT: 29.8 % — ABNORMAL LOW (ref 36.0–46.0)
Hemoglobin: 9.9 g/dL — ABNORMAL LOW (ref 12.0–15.0)
MCH: 28.9 pg (ref 26.0–34.0)
MCHC: 33.2 g/dL (ref 30.0–36.0)
MCV: 87.1 fL (ref 78.0–100.0)
Platelets: 254 10*3/uL (ref 150–400)
RBC: 3.42 MIL/uL — ABNORMAL LOW (ref 3.87–5.11)
RDW: 15.5 % (ref 11.5–15.5)
WBC: 11.6 10*3/uL — ABNORMAL HIGH (ref 4.0–10.5)

## 2018-04-16 LAB — RPR: RPR Ser Ql: NONREACTIVE

## 2018-04-16 MED ORDER — HYDROCHLOROTHIAZIDE 25 MG PO TABS
25.0000 mg | ORAL_TABLET | Freq: Every day | ORAL | Status: DC
  Administered 2018-04-16 – 2018-04-17 (×2): 25 mg via ORAL
  Filled 2018-04-16 (×3): qty 1

## 2018-04-16 MED ORDER — MAGNESIUM OXIDE 400 (241.3 MG) MG PO TABS
400.0000 mg | ORAL_TABLET | Freq: Every day | ORAL | Status: DC
  Administered 2018-04-17: 400 mg via ORAL
  Filled 2018-04-16 (×3): qty 1

## 2018-04-16 MED ORDER — POLYSACCHARIDE IRON COMPLEX 150 MG PO CAPS
150.0000 mg | ORAL_CAPSULE | Freq: Every day | ORAL | Status: DC
  Administered 2018-04-17: 150 mg via ORAL
  Filled 2018-04-16: qty 1

## 2018-04-16 MED ORDER — FAMOTIDINE 20 MG PO TABS
20.0000 mg | ORAL_TABLET | Freq: Once | ORAL | Status: AC
  Administered 2018-04-16: 20 mg via ORAL
  Filled 2018-04-16: qty 1

## 2018-04-16 NOTE — Lactation Note (Signed)
This note was copied from a baby's chart. Lactation Consultation Note Baby 84 hrs old. Mom states baby is BF well. Mom states baby has spit up several times.  Room very hot, baby swaddled,encouraged to lower temp down from 80 degrees to help baby not be so sleepy and may want to feed better. Mom has pendulous breast w/flat compressible nipple at the bottom of breast. Hand expression demonstrated w/easily expressed colostrum. Mom demonstrated hand expression back. Mom encouraged to feed baby 8-12 times/24 hours and with feeding cues. Mom encouraged to waken baby for feeds if hasn't cued in 3 hrs. Newborn behavior, STS, I&O, breast massage, supply and demand discussed. Encouraged mom to call for assistance if needed. [place] brochure given w/resources, support groups and [place] services.  Patient Name: Tasha Hansen Today's Date: 04/16/2018 Reason for consult: Initial assessment;1st time breastfeeding   Maternal Data Has patient been taught Hand Expression?: Yes Does the patient have breastfeeding experience prior to this delivery?: No  Feeding Feeding Type: Breast Fed Length of feed: 5 min  LATCH Score       Type of Nipple: Flat  Comfort (Breast/Nipple): Soft / non-tender        Interventions Interventions: Breast feeding basics reviewed;Position options;Breast massage;Hand express;Breast compression  Lactation Tools Discussed/Used WIC Program: No   Consult Status Consult Status: Follow-up Date: 04/16/18 Follow-up type: In-patient    Theodoro Kalata 04/16/2018, 6:15 AM

## 2018-04-16 NOTE — Progress Notes (Signed)
PPD 1 SVD with 2nd degree repair  S:  Reports feeling tired but good - some soreness and cramps / feet swollen             Tolerating po/ No nausea or vomiting             Bleeding is light             Pain controlled with Motrin             Up ad lib / ambulatory / voiding QS  Newborn Breast / female  O:   VS: BP 117/71 (BP Location: Right Arm)   Pulse 89   Temp 98.3 F (36.8 C) (Oral)   Resp 19   Ht 5\' 5"  (1.651 m)   Wt 116.2 kg   SpO2 100%   Breastfeeding? Unknown   BMI 42.62 kg/m   LABS:             Recent Labs    04/15/18 0735 04/16/18 0538  WBC 7.4 11.6*  HGB 10.7* 9.9*  PLT 277 254               Blood type: --/--/B POS, B POS Performed at [facility], [address] [place], [place] [address]  [phone])  Rubella: Immune (01/09 0000)                    I&O: Intake/Output      08/12 0701 - 08/13 0700 08/13 0701 - 08/14 0700   I.V. (mL/kg) 1972.4 (17)    Total Intake(mL/kg) 1972.4 (17)    Urine (mL/kg/hr) 400 (0.1)    Blood 500    Total Output 900    Net +1072.4                    Physical Exam:             Alert and oriented X3  Abdomen: soft, non-tender, non-distended              Fundus: firm, non-tender, Ueven  Perineum: ice pack in place  Lochia: light  Extremities: 2+ edema, no calf pain or tenderness  A: PPD # 1 SVD with 2nd degree repair             dependent edema              IDA of pregnancy  doing well - stable status  P: Routine post partum orders  Iron and magnesium             HCTZ to resolve dependent edema / net (+) I&O  Artelia Laroche CNM, MSN, [facility] 04/16/2018, 10:55 AM

## 2018-04-16 NOTE — Anesthesia Postprocedure Evaluation (Signed)
Anesthesia Post Note  Patient: Tasha Hansen  Procedure(s) Performed: AN AD [place]     Patient location during evaluation: Mother Baby Anesthesia Type: Epidural Level of consciousness: awake and alert and oriented Pain management: satisfactory to patient Vital Signs Assessment: post-procedure vital signs reviewed and stable Respiratory status: spontaneous breathing and nonlabored ventilation Cardiovascular status: stable Postop Assessment: no headache, no backache, no signs of nausea or vomiting, adequate PO intake, patient able to bend at knees and able to ambulate (patient up walking) Anesthetic complications: no    Last Vitals:  Vitals:   04/16/18 0020 04/16/18 0454  BP: 123/85 (!) 101/59  Pulse: 91 90  Resp: 18 18  Temp: 36.8 C 36.9 C  SpO2: 99% 99%    Last Pain:  Vitals:   04/16/18 0458  TempSrc:   PainSc: 1    Pain Goal:                 [name]

## 2018-04-17 MED ORDER — IBUPROFEN 600 MG PO TABS: 600.0000 mg | ORAL_TABLET | Freq: Four times a day (QID) | ORAL | 0 refills | Status: DC

## 2018-04-17 MED ORDER — WITCH HAZEL-GLYCERIN EX PADS: 1.0000 "application " | MEDICATED_PAD | CUTANEOUS | 12 refills | Status: DC | PRN

## 2018-04-17 MED ORDER — ACETAMINOPHEN 325 MG PO TABS: 650.0000 mg | ORAL_TABLET | ORAL | 0 refills | Status: DC | PRN

## 2018-04-17 MED ORDER — MAGNESIUM OXIDE 400 (241.3 MG) MG PO TABS: 400.0000 mg | ORAL_TABLET | Freq: Every day | ORAL | 0 refills | Status: DC

## 2018-04-17 MED ORDER — HYDROCHLOROTHIAZIDE 25 MG PO TABS: 25.0000 mg | ORAL_TABLET | Freq: Every day | ORAL | 0 refills | Status: DC

## 2018-04-17 NOTE — Discharge Summary (Signed)
Obstetric Discharge Summary  Patient ID: Tasha Hansen MRN: [id number] DOB/AGE: 1986-08-23 32 y.o.   Date of Admission: 04/15/2018  Date of Discharge:  04/17/18  Admitting Diagnosis: Induction of labor at [redacted]w[redacted]d for advanced cervical dilation  Secondary Diagnosis: Anemia in pregnancy, GERD, Abnormal quad screen, Obesity in pregnancy, History of fibroids  Mode of Delivery: normal spontaneous vaginal delivery     Discharge Diagnosis: No other diagnosis   Intrapartum Procedures: Atificial rupture of membranes, epidural and pitocin augmentation   Post partum procedures: None  Complications: Second degree perineal laceration   Tasha Hansen is a [id number] who had a SVD on 04/15/2018;  for further details of this surgery, please refer to the delivey note.  Patient had an uncomplicated postpartum course.  By time of discharge on PPD#2, her pain was controlled on oral pain medications; she had appropriate lochia and was ambulating, voiding without difficulty and tolerating regular diet.  She was deemed stable for discharge to home.    Labs:  CBC Latest Ref Rng & Units 04/16/2018 04/15/2018 10/14/2014  WBC 4.0 - 10.5 K/uL 11.6(H) 7.4 -  Hemoglobin 12.0 - 15.0 g/dL 9.9(L) 10.7(L) 12.7  Hematocrit 36.0 - 46.0 % 29.8(L) 31.9(L) -  Platelets 150 - 400 K/uL 119 417 -   Conflict (See Lab Report): B POS/B POS Performed at [facility], [address], [place], [place] [address]   Physical exam:   Temp:  [98 F (36.7 C)-98.2 F (36.8 C)] 98.2 F (36.8 C) (08/14 0537) Pulse Rate:  [78-83] 83 (08/14 0537) Resp:  [17-18] 17 (08/14 0537) BP: (95-114)/(61-72) 95/61 (08/14 0537) SpO2:  [100 %] 100 % (08/13 2325)  General: alert and no distress  Lochia: appropriate  Abdomen: soft, NT  Uterine Fundus: firm  Perineum: healing well, no significant drainage, no dehiscence, no significant erythema.  Extremities: No evidence of DVT seen on physical  exam.   Discharge Instructions: Per After Visit Summary.  Activity: Advance as tolerated. Pelvic rest for 6 weeks.  Also refer to After Visit Summary.  Diet: Regular  Medications: Allergies as of 04/17/2018      Reactions   Diphenhydramine Hcl Itching   Oseltamivir Phosphate Itching      Medication List    TAKE these medications   acetaminophen 325 MG tablet Commonly known as:  TYLENOL Take 2 tablets (650 mg total) by mouth every 4 (four) hours as needed (for pain scale < 4).   cetirizine 10 MG tablet Commonly known as:  ZYRTEC Take 10 mg by mouth daily.   FERRALET 90 90-1 MG Tabs Take 1 tablet by mouth daily.   hydrochlorothiazide 25 MG tablet Commonly known as:  HYDRODIURIL Take 1 tablet (25 mg total) by mouth daily. Start taking on:  04/18/2018   ibuprofen 600 MG tablet Commonly known as:  ADVIL,MOTRIN Take 1 tablet (600 mg total) by mouth every 6 (six) hours.   magnesium oxide 400 (241.3 Mg) MG tablet Commonly known as:  MAG-OX Take 1 tablet (400 mg total) by mouth daily. Start taking on:  04/18/2018   prenatal vitamin w/FE, FA 27-1 MG Tabs tablet Take 1 tablet by mouth daily at 12 noon.   ranitidine 150 MG capsule Commonly known as:  ZANTAC Take 150 mg by mouth 2 (two) times daily.   witch hazel-glycerin pad Commonly known as:  TUCKS Apply 1 application topically as needed for hemorrhoids.      Outpatient follow up:  Follow-up Information  Aloha Gell, MD Follow up.   Specialty:  Obstetrics and Gynecology Why:  The office will call you to schedule a postpartum visit.  Contact information: [place] [address] [phone]          Discharged Condition: stable  Discharged to: home   Newborn Data:  Disposition:home with mother  Apgars: APGAR (1 MIN): 7   APGAR (5 MINS): 9    Baby Feeding: Breast   Diona Fanti, [facility] 04/17/18 2:12 PM

## 2018-04-17 NOTE — Lactation Note (Signed)
This note was copied from a baby's chart. Lactation Consultation Note  Patient Name: Tasha Hansen [id number] Date: 04/17/2018 Reason for consult: Follow-up assessment Mom has been formula feeding.  She states baby wasn't getting enough and could only latch onto nipple.  Baby is sleeping and just finished a feeding.  Mom has her personal Medela pump in style here and would like to be shown how to use it.  Pump set up with instructions.  Instructed to pump every 3 hours for 15-20 minutes.  Discussed milk coming to volume.  Instructed to call for assist prior to discharge.  Outpatient services and support also reviewed and encouraged.  Maternal Data    Feeding    LATCH Score                   Interventions    Lactation Tools Discussed/Used Pump Review: Setup, frequency, and cleaning;Milk Storage Initiated by:: LM Date initiated:: 04/17/18   Consult Status Consult Status: Complete Follow-up type: Call as needed    Ave Filter 04/17/2018, 9:36 AM

## 2018-05-27 DIAGNOSIS — Z1151 Encounter for screening for human papillomavirus (HPV): Secondary | ICD-10-CM | POA: Diagnosis not present

## 2018-05-27 DIAGNOSIS — Z23 Encounter for immunization: Secondary | ICD-10-CM | POA: Diagnosis not present

## 2018-08-06 DIAGNOSIS — L600 Ingrowing nail: Secondary | ICD-10-CM | POA: Diagnosis not present

## 2018-08-27 ENCOUNTER — Encounter (HOSPITAL_COMMUNITY): Payer: Self-pay | Admitting: Emergency Medicine

## 2018-08-27 ENCOUNTER — Other Ambulatory Visit: Payer: Self-pay

## 2018-08-27 ENCOUNTER — Inpatient Hospital Stay (HOSPITAL_COMMUNITY)
Admission: EM | Admit: 2018-08-27 | Discharge: 2018-08-29 | DRG: 871 | Disposition: A | Discharge status: Home / Self Care | Payer: 59 | Attending: Family Medicine | Admitting: Family Medicine

## 2018-08-27 DIAGNOSIS — Z888 Allergy status to other drugs, medicaments and biological substances status: Secondary | ICD-10-CM | POA: Diagnosis not present

## 2018-08-27 DIAGNOSIS — Z8661 Personal history of infections of the central nervous system: Secondary | ICD-10-CM | POA: Diagnosis not present

## 2018-08-27 DIAGNOSIS — K219 Gastro-esophageal reflux disease without esophagitis: Secondary | ICD-10-CM | POA: Diagnosis present

## 2018-08-27 DIAGNOSIS — I10 Essential (primary) hypertension: Secondary | ICD-10-CM | POA: Diagnosis present

## 2018-08-27 DIAGNOSIS — G039 Meningitis, unspecified: Secondary | ICD-10-CM | POA: Diagnosis present

## 2018-08-27 DIAGNOSIS — G030 Nonpyogenic meningitis: Secondary | ICD-10-CM | POA: Diagnosis present

## 2018-08-27 DIAGNOSIS — K08409 Partial loss of teeth, unspecified cause, unspecified class: Secondary | ICD-10-CM | POA: Diagnosis present

## 2018-08-27 DIAGNOSIS — Z6837 Body mass index (BMI) 37.0-37.9, adult: Secondary | ICD-10-CM | POA: Diagnosis not present

## 2018-08-27 DIAGNOSIS — Z79899 Other long term (current) drug therapy: Secondary | ICD-10-CM | POA: Diagnosis not present

## 2018-08-27 DIAGNOSIS — Z8249 Family history of ischemic heart disease and other diseases of the circulatory system: Secondary | ICD-10-CM

## 2018-08-27 DIAGNOSIS — A419 Sepsis, unspecified organism: Principal | ICD-10-CM | POA: Diagnosis present

## 2018-08-27 DIAGNOSIS — G43909 Migraine, unspecified, not intractable, without status migrainosus: Secondary | ICD-10-CM | POA: Diagnosis present

## 2018-08-27 DIAGNOSIS — E669 Obesity, unspecified: Secondary | ICD-10-CM | POA: Diagnosis present

## 2018-08-27 DIAGNOSIS — R000 Tachycardia, unspecified: Secondary | ICD-10-CM | POA: Diagnosis not present

## 2018-08-27 HISTORY — DX: Headache: R51

## 2018-08-27 HISTORY — DX: Meningitis, unspecified: G03.9

## 2018-08-27 HISTORY — DX: Migraine, unspecified, not intractable, without status migrainosus: G43.909

## 2018-08-27 HISTORY — DX: Headache, unspecified: R51.9

## 2018-08-27 LAB — COMPREHENSIVE METABOLIC PANEL
ALT: 52 U/L — ABNORMAL HIGH (ref 0–44)
AST: 52 U/L — ABNORMAL HIGH (ref 15–41)
Albumin: 3.2 g/dL — ABNORMAL LOW (ref 3.5–5.0)
Alkaline Phosphatase: 91 U/L (ref 38–126)
Anion gap: 11 (ref 5–15)
BUN: 10 mg/dL (ref 6–20)
CO2: 22 mmol/L (ref 22–32)
Calcium: 8.4 mg/dL — ABNORMAL LOW (ref 8.9–10.3)
Chloride: 104 mmol/L (ref 98–111)
Creatinine, Ser: 0.88 mg/dL (ref 0.44–1.00)
GFR calc Af Amer: >60 mL/min (ref >60)
GFR calc non Af Amer: >60 mL/min (ref >60)
Glucose, Bld: 138 mg/dL — ABNORMAL HIGH (ref 70–99)
Potassium: 3.9 mmol/L (ref 3.5–5.1)
Sodium: 137 mmol/L (ref 135–145)
Total Bilirubin: 0.7 mg/dL (ref 0.3–1.2)
Total Protein: 7.4 g/dL (ref 6.5–8.1)

## 2018-08-27 LAB — PROTEIN, CSF: Total  Protein, CSF: 54 mg/dL — ABNORMAL HIGH (ref 15–45)

## 2018-08-27 LAB — CSF CELL COUNT WITH DIFFERENTIAL
Lymphs, CSF: 4 % — ABNORMAL LOW (ref 40–80)
Lymphs, CSF: 4 % — ABNORMAL LOW (ref 40–80)
RBC Count, CSF: 113 /mm3 — ABNORMAL HIGH
RBC Count, CSF: 2 /mm3 — ABNORMAL HIGH
Segmented Neutrophils-CSF: 96 % — ABNORMAL HIGH (ref 0–6)
Segmented Neutrophils-CSF: 96 % — ABNORMAL HIGH (ref 0–6)
Tube #: 1
Tube #: 4
WBC, CSF: 363 /mm3 (ref 0–5)
WBC, CSF: 81 /mm3 (ref 0–5)

## 2018-08-27 LAB — I-STAT BETA HCG BLOOD, ED (MC, WL, AP ONLY): I-stat hCG, quantitative: <5 m[IU]/mL (ref <5)

## 2018-08-27 LAB — CBC
HCT: 41.7 % (ref 36.0–46.0)
Hemoglobin: 12.9 g/dL (ref 12.0–15.0)
MCH: 26.7 pg (ref 26.0–34.0)
MCHC: 30.9 g/dL (ref 30.0–36.0)
MCV: 86.3 fL (ref 80.0–100.0)
Platelets: 263 10*3/uL (ref 150–400)
RBC: 4.83 MIL/uL (ref 3.87–5.11)
RDW: 13.1 % (ref 11.5–15.5)
WBC: 6.7 10*3/uL (ref 4.0–10.5)
nRBC: 0 % (ref 0.0–0.2)

## 2018-08-27 LAB — LIPASE, BLOOD: Lipase: 37 U/L (ref 11–51)

## 2018-08-27 LAB — GLUCOSE, CSF: Glucose, CSF: 65 mg/dL (ref 40–70)

## 2018-08-27 LAB — LACTIC ACID, PLASMA: Lactic Acid, Venous: 1.9 mmol/L (ref 0.5–1.9)

## 2018-08-27 LAB — I-STAT CG4 LACTIC ACID, ED: Lactic Acid, Venous: 1.99 mmol/L — ABNORMAL HIGH (ref 0.5–1.9)

## 2018-08-27 MED ORDER — ONDANSETRON 4 MG PO TBDP
4.0000 mg | ORAL_TABLET | Freq: Once | ORAL | Status: AC | PRN
  Administered 2018-08-27: 4 mg via ORAL
  Filled 2018-08-27: qty 1

## 2018-08-27 MED ORDER — ACETAMINOPHEN 325 MG PO TABS
325.0000 mg | ORAL_TABLET | Freq: Once | ORAL | Status: AC
  Administered 2018-08-27: 325 mg via ORAL
  Filled 2018-08-27: qty 1

## 2018-08-27 MED ORDER — VANCOMYCIN HCL 10 G IV SOLR
2000.0000 mg | Freq: Once | INTRAVENOUS | Status: AC
  Administered 2018-08-27: 2000 mg via INTRAVENOUS
  Filled 2018-08-27: qty 2000

## 2018-08-27 MED ORDER — LORAZEPAM 2 MG/ML IJ SOLN
0.5000 mg | Freq: Once | INTRAMUSCULAR | Status: AC
  Administered 2018-08-27: 0.5 mg via INTRAVENOUS
  Filled 2018-08-27: qty 1

## 2018-08-27 MED ORDER — HYDROMORPHONE HCL 1 MG/ML IJ SOLN
0.5000 mg | Freq: Once | INTRAMUSCULAR | Status: DC
  Filled 2018-08-27: qty 1

## 2018-08-27 MED ORDER — METOCLOPRAMIDE HCL 5 MG/ML IJ SOLN
10.0000 mg | Freq: Once | INTRAMUSCULAR | Status: AC
  Administered 2018-08-27: 10 mg via INTRAVENOUS
  Filled 2018-08-27: qty 2

## 2018-08-27 MED ORDER — ONDANSETRON HCL 4 MG/2ML IJ SOLN: 4.0000 mg | Freq: Four times a day (QID) | INTRAMUSCULAR | Status: DC | PRN

## 2018-08-27 MED ORDER — DEXAMETHASONE SODIUM PHOSPHATE 10 MG/ML IJ SOLN
10.0000 mg | Freq: Four times a day (QID) | INTRAMUSCULAR | Status: DC
  Administered 2018-08-27 – 2018-08-29 (×7): 10 mg via INTRAVENOUS
  Filled 2018-08-27 (×7): qty 1

## 2018-08-27 MED ORDER — MORPHINE SULFATE (PF) 4 MG/ML IV SOLN
6.0000 mg | Freq: Once | INTRAVENOUS | Status: AC
  Administered 2018-08-27: 6 mg via INTRAVENOUS
  Filled 2018-08-27: qty 2

## 2018-08-27 MED ORDER — SODIUM CHLORIDE 0.9 % IV SOLN
2.0000 g | Freq: Two times a day (BID) | INTRAVENOUS | Status: DC
  Administered 2018-08-27 – 2018-08-29 (×4): 2 g via INTRAVENOUS
  Filled 2018-08-27 (×5): qty 20

## 2018-08-27 MED ORDER — FENTANYL CITRATE (PF) 100 MCG/2ML IJ SOLN
50.0000 ug | Freq: Once | INTRAMUSCULAR | Status: AC
  Administered 2018-08-27: 50 ug via INTRAVENOUS
  Filled 2018-08-27: qty 2

## 2018-08-27 MED ORDER — KETOROLAC TROMETHAMINE 30 MG/ML IJ SOLN
15.0000 mg | Freq: Once | INTRAMUSCULAR | Status: AC
  Administered 2018-08-27: 15 mg via INTRAVENOUS
  Filled 2018-08-27: qty 1

## 2018-08-27 MED ORDER — ONDANSETRON HCL 4 MG/2ML IJ SOLN
INTRAMUSCULAR | Status: AC
  Filled 2018-08-27: qty 2

## 2018-08-27 MED ORDER — ENOXAPARIN SODIUM 40 MG/0.4ML ~~LOC~~ SOLN
40.0000 mg | SUBCUTANEOUS | Status: DC
  Administered 2018-08-27 – 2018-08-28 (×2): 40 mg via SUBCUTANEOUS
  Filled 2018-08-27 (×2): qty 0.4

## 2018-08-27 MED ORDER — DEXAMETHASONE SODIUM PHOSPHATE 10 MG/ML IJ SOLN
10.0000 mg | Freq: Once | INTRAMUSCULAR | Status: AC
  Administered 2018-08-27: 10 mg via INTRAVENOUS
  Filled 2018-08-27: qty 1

## 2018-08-27 MED ORDER — ONDANSETRON HCL 4 MG/2ML IJ SOLN
4.0000 mg | Freq: Once | INTRAMUSCULAR | Status: AC
  Administered 2018-08-27: 4 mg via INTRAVENOUS

## 2018-08-27 MED ORDER — SODIUM CHLORIDE 0.9 % IV SOLN
2.0000 g | INTRAVENOUS | Status: DC
  Administered 2018-08-27: 2 g via INTRAVENOUS
  Filled 2018-08-27: qty 20

## 2018-08-27 MED ORDER — SODIUM CHLORIDE 0.9 % IV BOLUS
2000.0000 mL | Freq: Once | INTRAVENOUS | Status: AC
  Administered 2018-08-27: 2000 mL via INTRAVENOUS

## 2018-08-27 MED ORDER — KETOROLAC TROMETHAMINE 30 MG/ML IJ SOLN: 30.0000 mg | Freq: Four times a day (QID) | INTRAMUSCULAR | Status: DC | PRN

## 2018-08-27 MED ORDER — ACETAMINOPHEN 325 MG PO TABS
650.0000 mg | ORAL_TABLET | Freq: Once | ORAL | Status: AC
  Administered 2018-08-27: 650 mg via ORAL
  Filled 2018-08-27: qty 2

## 2018-08-27 MED ORDER — ONDANSETRON HCL 4 MG PO TABS: 4.0000 mg | ORAL_TABLET | Freq: Four times a day (QID) | ORAL | Status: DC | PRN

## 2018-08-27 MED ORDER — ACETAMINOPHEN 650 MG RE SUPP: 650.0000 mg | Freq: Four times a day (QID) | RECTAL | Status: DC | PRN

## 2018-08-27 MED ORDER — OXYCODONE HCL 5 MG PO TABS
5.0000 mg | ORAL_TABLET | ORAL | Status: DC | PRN
  Administered 2018-08-27 – 2018-08-29 (×5): 5 mg via ORAL
  Filled 2018-08-27 (×5): qty 1

## 2018-08-27 MED ORDER — SODIUM CHLORIDE 0.9 % IV SOLN
INTRAVENOUS | Status: DC
  Administered 2018-08-27 – 2018-08-28 (×4): via INTRAVENOUS

## 2018-08-27 MED ORDER — VANCOMYCIN HCL 10 G IV SOLR
1250.0000 mg | Freq: Two times a day (BID) | INTRAVENOUS | Status: DC
  Administered 2018-08-28: 1250 mg via INTRAVENOUS
  Filled 2018-08-27 (×2): qty 1250

## 2018-08-27 MED ORDER — ACETAMINOPHEN 325 MG PO TABS
650.0000 mg | ORAL_TABLET | Freq: Four times a day (QID) | ORAL | Status: DC | PRN
  Administered 2018-08-27: 650 mg via ORAL
  Filled 2018-08-27: qty 2

## 2018-08-27 MED ORDER — NORETHINDRONE ACET-ETHINYL EST 1-20 MG-MCG PO TABS
1.0000 | ORAL_TABLET | Freq: Every day | ORAL | Status: DC
  Administered 2018-08-28 – 2018-08-29 (×2): 1 via ORAL
  Filled 2018-08-27: qty 1

## 2018-08-27 NOTE — Progress Notes (Signed)
Pharmacy Antibiotic Note  Tasha Hansen is a 32 y.o. female admitted on 08/27/2018 with suspected meningitis.  Pharmacy has been consulted for vancomycin dosing. CrCl >100.   Goal AUC 400-550. Expected AUC: 487.9, Scr 0.88   Plan: Vancomycin 2000mg  IV x1 then 1250,g Q12H F/u cx, renal function, LOT, clinical status, levels prn   Height: 5\' 6"  (167.6 cm) Weight: 230 lb (104.3 kg) IBW/kg (Calculated) : 59.3  Temp (24hrs), Avg:99.8 F (37.7 C), Min:99 F (37.2 C), Max:101 F (38.3 C)  Recent Labs  Lab 08/27/18 0613 08/27/18 1101 08/27/18 1158  WBC 6.7  --   --   CREATININE 0.88  --   --   LATICACIDVEN  --  1.99* 1.9    Estimated Creatinine Clearance: 113 mL/min (by C-G formula based on SCr of 0.88 mg/dL).    Allergies  Allergen Reactions  . Diphenhydramine Hcl Itching  . Oseltamivir Phosphate Itching    Antimicrobials this admission: Vanc 12/24 >>  CTX 12/24 >>   Dose adjustments this admission:   Microbiology results:   Thank you for allowing pharmacy to be a part of this patient's care.  Harrietta Guardian, PharmD PGY1 Pharmacy Resident 08/27/2018    2:59 PM Please check AMION for all [place] numbers

## 2018-08-27 NOTE — Consult Note (Signed)
[facility] for Infectious Disease       Reason for Consult: meningitis    Referring Physician: Dr. Rockne Menghini  Principal Problem:   Meningitis Active Problems:   Sepsis ([place])     Recommendations: Vancomycin and ceftriaxone 2 grams twice a day Will check VDRL HIV ordered already  Assessment: She has meningitis, bacterial vs viral.  No encephalopathy to suggest encephalitis.  I would treat for now with high neutrophils.  Gram stain negative so no indication to continue with steroids.  No indication for isolation.   Antivirals not indicated.  Antibiotics: Vancomycin and ceftriaxone now  HPI: Tasha Hansen is a 32 y.o. female with history of aseptic meningitis in 2013 came in with headache and CSF findings of 363 WBCs, 96% neutrophils, mildly increased protein, normal glucose c/w meningitis, viral vs bacterial.  No confusion.  No new medication changes.  No NSAID use.  Uses acetaminophen for headaches and no excessive use.  Some family history of mild RA-like symptoms.  Patient has associated hand joint swelling with this acute illness as well as with previous illness.  No history of HSV, no active lesions. Up to date on vaccines.     Review of Systems:  Constitutional: positive for fevers, chills and malaise or negative for anorexia Gastrointestinal: negative for nausea and diarrhea Musculoskeletal: negative for myalgias and arthralgias Neurological: positive for headaches and mild photophobia All other systems reviewed and are negative    Past Medical History:  Diagnosis Date  . Allergy   . Anemia   . GERD (gastroesophageal reflux disease)   . Meningitis 2013  . No pertinent past medical history     Social History   Tobacco Use  . Smoking status: Never Smoker  . Smokeless tobacco: Never Used  Substance Use Topics  . Alcohol use: No    Alcohol/week: 0.0 standard drinks  . Drug use: No    Family History  Problem Relation Age of Onset  . Hypertension  Father   . Hypertension Maternal Grandmother   . Diabetes Maternal Grandmother   . Hypertension Maternal Grandfather   . Diabetes Maternal Grandfather   . Hypertension Paternal Grandmother   . Diabetes Paternal Grandmother   . Diabetes Paternal Grandfather   . Hypertension Paternal Grandfather     Allergies  Allergen Reactions  . Diphenhydramine Hcl Itching  . Oseltamivir Phosphate Itching    Physical Exam: Constitutional: in no apparent distress  Vitals:   08/27/18 1415 08/27/18 1545  BP: 117/66 124/75  Pulse: (!) 103 (!) 106  Resp:    Temp:    SpO2: 100% 99%   EYES: anicteric ENMT: no thrush Cardiovascular: Cor RRR Respiratory: CTA B; normal respiratory effort GI: soft, nt Musculoskeletal: no edema Skin: no rashe Neuro: non focal, + nuchal rigidity  Lab Results  Component Value Date   WBC 6.7 08/27/2018   HGB 12.9 08/27/2018   HCT 41.7 08/27/2018   MCV 86.3 08/27/2018   PLT 263 08/27/2018    Lab Results  Component Value Date   CREATININE 0.88 08/27/2018   BUN 10 08/27/2018   NA 137 08/27/2018   K 3.9 08/27/2018   CL 104 08/27/2018   CO2 22 08/27/2018    Lab Results  Component Value Date   ALT 52 (H) 08/27/2018   AST 52 (H) 08/27/2018   ALKPHOS 91 08/27/2018     Microbiology: Recent Results (from the past 240 hour(s))  Culture, blood (Routine X 2) w Reflex to ID Panel  Status: None (Preliminary result)   Collection Time: 08/27/18 10:53 AM  Result Value Ref Range Status   Specimen Description BLOOD RIGHT ARM  Final   Special Requests   Final    BOTTLES DRAWN AEROBIC AND ANAEROBIC Blood Culture adequate volume   Culture   Final    NO GROWTH < 12 HOURS Performed at [facility], [address] [place], [place] [address]    Report Status PENDING  Incomplete  Culture, blood (Routine X 2) w Reflex to ID Panel     Status: None (Preliminary result)   Collection Time: 08/27/18 10:53 AM  Result Value Ref Range Status   Specimen Description  BLOOD RIGHT ARM  Final   Special Requests   Final    BOTTLES DRAWN AEROBIC ONLY Blood Culture adequate volume   Culture   Final    NO GROWTH < 12 HOURS Performed at [facility], [place] [address], [place], [place] [address]    Report Status PENDING  Incomplete  CSF culture     Status: None (Preliminary result)   Collection Time: 08/27/18 11:39 AM  Result Value Ref Range Status   Specimen Description CSF  Final   Special Requests NONE  Final   Gram Stain   Final    WBC PRESENT,BOTH PMN AND MONONUCLEAR NO ORGANISMS SEEN CYTOSPIN SMEAR Performed at [facility], [place] [address], [place],  [address]    Culture PENDING  Incomplete   Report Status PENDING  Incomplete    Thayer Headings, [facility] [email or web address] [phone] pager  [phone] cell 08/27/2018, 4:00 PM

## 2018-08-27 NOTE — ED Triage Notes (Addendum)
C/o generalized headache and neck pain since Wednesday with nausea.  States pain gradually worse with intermittent sharp pains to R side of head.  Vomited large amount of emesis at triage- states nausea is better but headache has increased.  Generalized weakness.  No arm drift.  States she has a history of migraines but it has never been diagnosed.

## 2018-08-27 NOTE — H&P (Signed)
History and Physical:    Tasha Hansen   ZCH:[id number] DOB: 1986/05/27 DOA: 08/27/2018  Referring MD/provider: Dr. Vivi Martens PCP: Lorelei Pont Gay Filler, MD   Patient coming from: Home  Chief Complaint: Headache and neck pain  History of Present Illness:   Tasha Hansen is an 32 y.o. female with a PMH of migraine headaches and a remote history of aseptic meningitis in 2013 who presented to the ED for evaluation of headache and neck pain that has worsened over the past few days.  Headache started about a week ago.  Symptoms associated with mild photophobia but no evidence of rash, visual changes or focal neurological deficits.  Patient has taken various OTC medications without any relief.  Patient reports that her headache pain is similar to when she had meningitis in the past, although the previous episode was associated with worse neck pain.  ED Course: Upon evaluation, the patient was tachycardic with heart rate ranging from 1 11-124, mildly hypotensive at 95/58, improved to 107/57 and febrile to 101 F.  The patient was given 2 g of IV Rocephin, 10 mg of Decadron, 50 mcg of fentanyl, 15 mg of Toradol, 0.5 mg of Ativan, 10 mg of Reglan, 4 mg of Zofran x2, 2 L of IV fluids followed by normal saline at 125 cc an hour.  TRH subsequently called to admit.  ROS:   Review of Systems  Constitutional: Positive for fever.  HENT: Negative.   Eyes: Positive for photophobia.  Respiratory: Negative.   Cardiovascular: Negative.   Gastrointestinal: Positive for nausea.  Genitourinary: Negative.   Musculoskeletal: Positive for neck pain.  Skin: Negative for rash.  Neurological: Positive for headaches.  Endo/Heme/Allergies: Negative.   Psychiatric/Behavioral: Negative.     Past Medical History:   Past Medical History:  Diagnosis Date  . Allergy   . Anemia   . GERD (gastroesophageal reflux disease)   . Meningitis 2013  . No pertinent past medical history     Past Surgical  History:   Past Surgical History:  Procedure Laterality Date  . [place]   as an infant  . WISDOM TOOTH EXTRACTION  age 59    Social History:   Social History   Socioeconomic History  . Marital status: Married    Spouse name: Not on file  . Number of children: Not on file  . Years of education: Not on file  . Highest education level: Not on file  Occupational History  . Not on file  Social Needs  . Financial resource strain: Not on file  . Food insecurity:    Worry: Not on file    Inability: Not on file  . Transportation needs:    Medical: Not on file    Non-medical: Not on file  Tobacco Use  . Smoking status: Never Smoker  . Smokeless tobacco: Never Used  Substance and Sexual Activity  . Alcohol use: No    Alcohol/week: 0.0 standard drinks  . Drug use: No  . Sexual activity: Yes    Partners: Male  Lifestyle  . Physical activity:    Days per week: Not on file    Minutes per session: Not on file  . Stress: Not on file  Relationships  . Social connections:    Talks on phone: Not on file    Gets together: Not on file    Attends religious service: Not on file    Active member of club or organization: Not on file  Attends meetings of clubs or organizations: Not on file    Relationship status: Not on file  . Intimate partner violence:    Fear of current or ex partner: Not on file    Emotionally abused: Not on file    Physically abused: Not on file    Forced sexual activity: Not on file  Other Topics Concern  . Not on file  Social History Narrative   College degree   Works at [employer]   Diphenhydramine hcl and Oseltamivir phosphate  Family history:   Family History  Problem Relation Age of Onset  . Hypertension Father   . Hypertension Maternal Grandmother   . Diabetes Maternal Grandmother   . Hypertension Maternal Grandfather   . Diabetes Maternal Grandfather   . Hypertension Paternal Grandmother   .  Diabetes Paternal Grandmother   . Diabetes Paternal Grandfather   . Hypertension Paternal Grandfather     Current Medications:   Prior to Admission medications   Medication Sig Start Date End Date Taking? Authorizing Provider  acetaminophen (TYLENOL) 325 MG tablet Take 2 tablets (650 mg total) by mouth every 4 (four) hours as needed (for pain scale < 4). 04/17/18  Yes Lawhorn, Lara Mulch, CNM  norethindrone-ethinyl estradiol (LOESTRIN 1/20, 21,) 1-20 MG-MCG tablet Take 1 tablet by mouth daily.   Yes [provider]  sulfamethoxazole-trimethoprim (BACTRIM DS,SEPTRA DS) 800-160 MG tablet Take 1 tablet by mouth 2 (two) times daily. For 10 days   Yes [provider]  hydrochlorothiazide (HYDRODIURIL) 25 MG tablet Take 1 tablet (25 mg total) by mouth daily. Patient not taking: Reported on 08/27/2018 04/18/18   Diona Fanti, CNM  ibuprofen (ADVIL,MOTRIN) 600 MG tablet Take 1 tablet (600 mg total) by mouth every 6 (six) hours. Patient not taking: Reported on 08/27/2018 04/17/18   Diona Fanti, CNM  magnesium oxide (MAG-OX) 400 (241.3 Mg) MG tablet Take 1 tablet (400 mg total) by mouth daily. Patient not taking: Reported on 08/27/2018 04/18/18   Diona Fanti, CNM  witch hazel-glycerin (TUCKS) pad Apply 1 application topically as needed for hemorrhoids. Patient not taking: Reported on 08/27/2018 04/17/18   Diona Fanti, CNM    Physical Exam:   Vitals:   08/27/18 1030 08/27/18 1142 08/27/18 1205 08/27/18 1300  BP: (!) 95/58 (!) 96/56 (!) 102/55 (!) 107/57  Pulse: (!) 124 (!) 111 (!) 115 (!) 113  Resp:  17  17  Temp:  99.4 F (37.4 C)    TempSrc:  Oral    SpO2: 100% 99% 100% 99%  Weight:      Height:         Physical Exam: Blood pressure (!) 107/57, pulse (!) 113, temperature 99.4 F (37.4 C), temperature source Oral, resp. rate 17, height 5\' 6"  (1.676 m), weight 104.3 kg, last menstrual period 08/25/2018, SpO2 99 %,  unknown if currently breastfeeding. Gen: Uncomfortable appearing, lying in a dark room. Head: Normocephalic, atraumatic. Eyes: Pupils equal, round and reactive to light. Extraocular movements intact.  Sclerae nonicteric. No lid lag. Mouth: Oropharynx reveals moist mucous membranes. Dentition is intact. Neck: Supple, no thyromegaly, no lymphadenopathy, no jugular venous distention. Chest: Lungs are clear to auscultation with good air movement. No rales, rhonchi or wheezes.  CV: Heart sounds are tachycardic/regular with an S1, S2. No murmurs, rubs, clicks, or gallops.  Abdomen: Soft, nontender, nondistended with normal active bowel sounds. No hepatosplenomegaly or palpable masses. Extremities:  Extremities are without clubbing, or cyanosis. No edema. Pedal pulses 2+.  Skin: Warm and dry. No rashes, lesions or wounds. Neuro: Alert and oriented times 3; grossly nonfocal.  Psych: Insight is good and judgment is appropriate. Mood and affect normal.   Data Review:    Labs: Basic Metabolic Panel: Recent Labs  Lab 08/27/18 0613  NA 137  K 3.9  CL 104  CO2 22  GLUCOSE 138*  BUN 10  CREATININE 0.88  CALCIUM 8.4*   Liver Function Tests: Recent Labs  Lab 08/27/18 0613  AST 52*  ALT 52*  ALKPHOS 91  BILITOT 0.7  PROT 7.4  ALBUMIN 3.2*   Recent Labs  Lab 08/27/18 0613  LIPASE 37   CBC: Recent Labs  Lab 08/27/18 0613  WBC 6.7  HGB 12.9  HCT 41.7  MCV 86.3  PLT 263     Radiographic Studies: No results found.  EKG: Independently reviewed.  Sinus tachycardia at 122 bpm, no ischemic changes appreciated.   Assessment/Plan:   Principal Problem:   Meningitis and Sepsis ([place]) Patient was given 2 g of IV Rocephin in the ED.  We will continue this every 12 hours and add vancomycin, dosing per pharmacy.  She has received 1 dose of Decadron and will defer to ID with regard to whether or not this needs to be continued.  I do not think HSV treatment is indicated given that  when she had meningitis in the past, she was not found to be HSV positive, but again would defer treatment to Dr. Linus Salmons, who I have discussed the case with.  Patient was febrile, hypotensive and tachycardic in the ED.  Lactic acid 1.99.  LP performed with CSF cell count elevated at 81 and 363 in tubes 1 and 4.  The fluid was hazy in appearance, and 96% of the white cells were neutrophils raising the concern for bacterial meningitis.  CSF protein was elevated at 54, and glucose was normal at 65.  HIV screening The patient falls between the ages of 13-64 and should be screened for HIV, therefore HIV testing ordered.  Body mass index is 37.12 kg/m.  Other information:   DVT prophylaxis: Lovenox ordered. Code Status: Full code. Family Communication: Family updated at the bedside. Disposition Plan: Admit inpatient, likely will be in the hospital for several days for headache pain control and IV antibiotics for presumed bacterial meningitis. Consults called: Infectious Disease. Admission status: Inpatient.  The medical decision making is of moderate complexity, therefore this is a level 2 visit.  Margreta Journey Rama [facility] Pager [phone] Cell: [phone]   If 7PM-7AM, please contact night-coverage [email or web address] Password [email or web address] 08/27/2018, 2:40 PM

## 2018-08-27 NOTE — ED Notes (Addendum)
Pt lying on stretcher chair and lights out for comfort.  States she has history of meningitis in 2013 and headache feels similar but she is able to move her neck a lot easier than before.  Reports slight light sensitivity.

## 2018-08-27 NOTE — ED Provider Notes (Signed)
[place] EMERGENCY DEPARTMENT Provider Note   CSN: [id number] Arrival date & time: 08/27/18  0545     History   Chief Complaint Chief Complaint  Patient presents with  . Headache  . Neck Pain    HPI Tasha Hansen is a 32 y.o. female.  32 year old female with history of undiagnosed migraines presents with recurrent headache times several days.  Patient notes mild photophobia but denies any neck pain or stiffness.  She does have a remote history of meningitis about 6 years ago but states that this does not feel similar.  Denies any rashes.  No visual changes or focal neurological weaknesses.  Has been self-medicating with over-the-counter medication without relief.     Past Medical History:  Diagnosis Date  . Allergy   . Anemia   . GERD (gastroesophageal reflux disease)   . Meningitis 2013  . No pertinent past medical history     Patient Active Problem List   Diagnosis Date Noted  . SVD (spontaneous vaginal delivery) 04/16/2018  . Postpartum care following vaginal delivery (8/12) 04/16/2018  . Second-degree perineal laceration, with delivery 04/16/2018  . Encounter for planned induction of labor 04/15/2018  . Term pregnancy 04/15/2018  . Prolonged latent phase of labor 04/05/2018    Past Surgical History:  Procedure Laterality Date  . [place]   as an infant  . WISDOM TOOTH EXTRACTION  age 52     OB History    Gravida  1   Para  1   Term  1   Preterm      AB      Living  1     SAB      TAB      Ectopic      Multiple  0   Live Births  1            Home Medications    Prior to Admission medications   Medication Sig Start Date End Date Taking? Authorizing Provider  acetaminophen (TYLENOL) 325 MG tablet Take 2 tablets (650 mg total) by mouth every 4 (four) hours as needed (for pain scale < 4). 04/17/18   Lawhorn, Lara Mulch, CNM  cetirizine (ZYRTEC) 10 MG tablet Take 10 mg by mouth daily.     [provider]  Fe Cbn-Fe Gluc-FA-B12-C-DSS (FERRALET 90) 90-1 MG TABS Take 1 tablet by mouth daily. 02/18/18   [provider]  hydrochlorothiazide (HYDRODIURIL) 25 MG tablet Take 1 tablet (25 mg total) by mouth daily. 04/18/18   Lawhorn, Lara Mulch, CNM  ibuprofen (ADVIL,MOTRIN) 600 MG tablet Take 1 tablet (600 mg total) by mouth every 6 (six) hours. 04/17/18   Lawhorn, Lara Mulch, CNM  magnesium oxide (MAG-OX) 400 (241.3 Mg) MG tablet Take 1 tablet (400 mg total) by mouth daily. 04/18/18   Lawhorn, Lara Mulch, CNM  prenatal vitamin w/FE, FA (PRENATAL 1 + 1) 27-1 MG TABS tablet Take 1 tablet by mouth daily at 12 noon.    [provider]  ranitidine (ZANTAC) 150 MG capsule Take 150 mg by mouth 2 (two) times daily.    [provider]  witch hazel-glycerin (TUCKS) pad Apply 1 application topically as needed for hemorrhoids. 04/17/18   Lawhorn, Lara Mulch, CNM    Family History Family History  Problem Relation Age of Onset  . Hypertension Father   . Hypertension Maternal Grandmother   . Diabetes Maternal Grandmother   . Hypertension Maternal Grandfather   . Diabetes Maternal  Grandfather   . Hypertension Paternal Grandmother   . Diabetes Paternal Grandmother   . Diabetes Paternal Grandfather   . Hypertension Paternal Grandfather     Social History Social History   Tobacco Use  . Smoking status: Never Smoker  . Smokeless tobacco: Never Used  Substance Use Topics  . Alcohol use: No    Alcohol/week: 0.0 standard drinks  . Drug use: No     Allergies   Diphenhydramine hcl and Oseltamivir phosphate   Review of Systems Review of Systems  All other systems reviewed and are negative.    Physical Exam Updated Vital Signs BP (!) 145/78 (BP Location: Right Arm)   Pulse (!) 133   Temp 99 F (37.2 C) (Oral)   Resp 18   Ht 1.676 m (5\' 6" )   Wt 104.3 kg   LMP 08/25/2018   SpO2 100%   BMI 37.12 kg/m   Physical  Exam Vitals signs and nursing note reviewed.  Constitutional:      General: She is not in acute distress.    Appearance: Normal appearance. She is well-developed. She is not toxic-appearing.  HENT:     Head: Normocephalic and atraumatic.  Eyes:     General: Lids are normal.     Conjunctiva/sclera: Conjunctivae normal.     Pupils: Pupils are equal, round, and reactive to light.  Neck:     Musculoskeletal: Full passive range of motion without pain, normal range of motion and neck supple. No neck rigidity or pain with movement.     Thyroid: No thyroid mass.     Trachea: No tracheal deviation.  Cardiovascular:     Rate and Rhythm: Regular rhythm. Tachycardia present.     Heart sounds: Normal heart sounds. No murmur. No gallop.   Pulmonary:     Effort: Pulmonary effort is normal. No respiratory distress.     Breath sounds: Normal breath sounds. No stridor. No decreased breath sounds, wheezing, rhonchi or rales.  Abdominal:     General: Bowel sounds are normal. There is no distension.     Palpations: Abdomen is soft.     Tenderness: There is no abdominal tenderness. There is no rebound.  Musculoskeletal: Normal range of motion.        General: No tenderness.  Skin:    General: Skin is warm and dry.     Findings: No abrasion or rash.  Neurological:     Mental Status: She is alert and oriented to person, place, and time.     GCS: GCS eye subscore is 4. GCS verbal subscore is 5. GCS motor subscore is 6.     Cranial Nerves: No cranial nerve deficit.     Sensory: No sensory deficit.     Motor: No tremor or abnormal muscle tone.     Comments: Strength is 5 out of 5 throughout  Psychiatric:        Speech: Speech normal.        Behavior: Behavior normal.      ED Treatments / Results  Labs (all labs ordered are listed, but only abnormal results are displayed) Labs Reviewed  COMPREHENSIVE METABOLIC PANEL - Abnormal; Notable for the following components:      Result Value   Glucose,  Bld 138 (*)    Calcium 8.4 (*)    Albumin 3.2 (*)    AST 52 (*)    ALT 52 (*)    All other components within normal limits  LIPASE, BLOOD  CBC  URINALYSIS,  ROUTINE W REFLEX MICROSCOPIC  I-STAT BETA HCG BLOOD, ED ([facility], [facility], [facility] ONLY)    EKG None  Radiology No results found.  Procedures .Lumbar Puncture Date/Time: 08/27/2018 11:23 AM Performed by: Lacretia Leigh, MD Authorized by: Lacretia Leigh, MD   Consent:    Consent obtained:  Written   Consent given by:  Patient   Alternatives discussed:  Delayed treatment Pre-procedure details:    Procedure purpose:  Diagnostic   Preparation: Patient was prepped and draped in usual sterile fashion   Anesthesia (see MAR for exact dosages):    Anesthesia method:  Local infiltration   Local anesthetic:  Lidocaine 1% w/o epi Procedure details:    Lumbar space:  L4-L5 interspace   Patient position:  Sitting   Needle gauge:  18   Needle type:  Diamond point   Needle length (in):  1.5   Ultrasound guidance: no     Number of attempts:  2   Fluid appearance:  Blood-tinged   Tubes of fluid:  4   Total volume (ml):  10 Post-procedure:    Puncture site:  Adhesive bandage applied   Patient tolerance of procedure:  Tolerated well, no immediate complications   (including critical care time)  Medications Ordered in ED Medications  sodium chloride 0.9 % bolus 2,000 mL (has no administration in time range)  0.9 %  sodium chloride infusion (has no administration in time range)  metoCLOPramide (REGLAN) injection 10 mg (has no administration in time range)  LORazepam (ATIVAN) injection 0.5 mg (has no administration in time range)  ketorolac (TORADOL) 30 MG/ML injection 15 mg (has no administration in time range)  ondansetron (ZOFRAN-ODT) disintegrating tablet 4 mg (4 mg Oral Given 08/27/18 1324)     Initial Impression / Assessment and Plan / ED Course  I have reviewed the triage vital signs and the nursing notes.  Pertinent labs &  imaging results that were available during my care of the patient were reviewed by me and considered in my medical decision making (see chart for details).    11:25 AM Patient medicated for pain that she says is like her prior migraines. Pt denied any neck discomfort. Patient was noted to be tachycardic which is felt to be from pain.    She however developed a temperature and therefore proceeded to perform an LP.  She was started on empiric antibiotics as well as steroids. 2:11 PM  Patient's cell count and differential consistent with possible arterial meningitis.  Patient's heart rate and blood pressure has improved.  Her prior lactate was noted and blood cultures have been obtained.  Will be admitted to the hospitalist service   CRITICAL CARE Performed by: Leota Jacobsen Total critical care time: 60 minutes Critical care time was exclusive of separately billable procedures and treating other patients. Critical care was necessary to treat or prevent imminent or life-threatening deterioration. Critical care was time spent personally by me on the following activities: development of treatment plan with patient and/or surrogate as well as nursing, discussions with consultants, evaluation of patient's response to treatment, examination of patient, obtaining history from patient or surrogate, ordering and performing treatments and interventions, ordering and review of laboratory studies, ordering and review of radiographic studies, pulse oximetry and re-evaluation of patient's condition.     Final Clinical Impressions(s) / ED Diagnoses   Final diagnoses:  None    ED Discharge Orders    None       Lacretia Leigh, MD 08/27/18 1413

## 2018-08-27 NOTE — ED Notes (Signed)
Admitting MD at bedside.

## 2018-08-27 NOTE — ED Notes (Signed)
MD at bedside. 

## 2018-08-27 NOTE — Progress Notes (Signed)
Tasha Hansen is a 33 y.o. female patient admitted from ED awake, alert - oriented  X 4 - no acute distress noted.  VSS - Blood pressure 102/72, pulse (!) 111, temperature (!) 103.2 F (39.6 C), temperature source Oral, resp. rate 18, height 5\' 5"  (1.651 m), weight 104.3 kg, last menstrual period 08/25/2018, SpO2 100 %, unknown if currently breastfeeding.    IV in place, occlusive dsg intact without redness.  Orientation to room, and floor completed. Admission INP armband ID verified with patient and in place. Fall assessment complete, with patient and family able to verbalize understanding of risk associated with falls, and verbalized understanding to call nsg before up out of bed.  Call light within reach, patient able to voice, and demonstrate understanding.  Skin, clean-dry- intact without evidence of bruising, or skin tears.   No evidence of skin break down noted on exam.     Will cont to eval and treat per MD orders.  [name], RN 08/27/2018 5:56 PM

## 2018-08-27 NOTE — ED Notes (Signed)
MD at bedside for procedure.

## 2018-08-28 LAB — GLUCOSE, CAPILLARY
Glucose-Capillary: 163 mg/dL — ABNORMAL HIGH (ref 70–99)
Glucose-Capillary: 147 mg/dL — ABNORMAL HIGH (ref 70–99)
Glucose-Capillary: 159 mg/dL — ABNORMAL HIGH (ref 70–99)

## 2018-08-28 NOTE — Progress Notes (Signed)
Patient Demographics:    Tasha Hansen, is a 32 y.o. female, DOB - December 01, 1985, JGG:[id number]  Admit date - 08/27/2018   Admitting Physician Venetia Maxon Rama, MD  Outpatient Primary MD for the patient is Marda Stalker, PA-C  LOS - 1   Chief Complaint  Patient presents with  . Headache  . Neck Pain        Subjective:    Marquis Lunch today has no fevers, no emesis,  No chest pain,    Assessment  & Plan :    Principal Problem:   Meningitis Active Problems:   Sepsis [facility])  Brief Summary 32 year old female who is 4 months postpartum with prior history of aseptic meningitis in 2013 admitted on 08/27/2018 with headache, photophobia, neck pain, fevers and CSF fluid analysis suggestive of meningitis   Plan:  1)Aseptic Meningitis--ID input appreciated okay to stop vancomycin, continue Rocephin 2 g every 12 hours, possible discharge on 08/29/2018 if  afebrile, T-max was 103.2, patient had leukocytosis will not recheck CBC she is on high-dose Decadron which we perpetuated her leukocytosis  2)Sepsis--- secondary to #1 above, patient met sepsis criteria on admission with fevers above 101, T-max 103.2, tachycardia with heart rate in the 120s, and relative hypotension--- sepsis pathophysiology but mostly resolved except for fevers  3) obesity----lifestyle and dietary modifications discussed  4) history of hypertension prior to admission--- patient was on HCTZ which is currently on hold blood pressures have improved but remains somewhat soft  Disposition/Need for in-Hospital Stay- patient unable to be discharged at this time due to aseptic meningitis requiring IV Rocephin pending further clinical and culture data  Code Status : Full   Family Communication:   Mom at bedside   Disposition Plan  : Possibly home in 1 to 2 days if afebrile  Consults  : Infectious disease  DVT Prophylaxis  :   Lovenox -   Lab Results  Component Value Date   PLT 263 08/27/2018    Inpatient Medications  Scheduled Meds: . dexamethasone  10 mg Intravenous Q6H  . enoxaparin (LOVENOX) injection  40 mg Subcutaneous Q24H  . norethindrone-ethinyl estradiol  1 tablet Oral Daily   Continuous Infusions: . sodium chloride 125 mL/hr at 08/28/18 1229  . cefTRIAXone (ROCEPHIN)  IV 2 g (08/28/18 0958)   PRN Meds:.acetaminophen **OR** acetaminophen, ondansetron **OR** ondansetron (ZOFRAN) IV, oxyCODONE    Anti-infectives (From admission, onward)   Start     Dose/Rate Route Frequency Ordered Stop   08/28/18 0300  vancomycin (VANCOCIN) 1,250 mg in sodium chloride 0.9 % 250 mL IVPB  Status:  Discontinued     1,250 mg 166.7 mL/hr over 90 Minutes Intravenous Every 12 hours 08/27/18 1505 08/28/18 1253   08/27/18 2200  cefTRIAXone (ROCEPHIN) 2 g in sodium chloride 0.9 % 100 mL IVPB     2 g 200 mL/hr over 30 Minutes Intravenous Every 12 hours 08/27/18 1616     08/27/18 1500  vancomycin (VANCOCIN) 2,000 mg in sodium chloride 0.9 % 500 mL IVPB     2,000 mg 250 mL/hr over 120 Minutes Intravenous  Once 08/27/18 1454 08/27/18 1932   08/27/18 1115  cefTRIAXone (ROCEPHIN) 2 g in sodium chloride 0.9 % 100 mL IVPB  Status:  Discontinued  2 g 200 mL/hr over 30 Minutes Intravenous Every 24 hours 08/27/18 1100 08/27/18 1616        Objective:   Vitals:   08/27/18 1859 08/27/18 2206 08/28/18 0444 08/28/18 1153  BP:  (!) 99/49 111/80 110/75  Pulse:  99 91 79  Resp:  18 20   Temp: (!) 102.2 F (39 C) 99.7 F (37.6 C) 99.9 F (37.7 C) 98.3 F (36.8 C)  TempSrc: Oral Oral Oral Oral  SpO2:  97% 96% 100%  Weight:      Height:        Wt Readings from Last 3 Encounters:  08/27/18 104.3 kg  04/04/18 116 kg  12/14/14 98.4 kg     Intake/Output Summary (Last 24 hours) at 08/28/2018 1535 Last data filed at 08/28/2018 0400 Gross per 24 hour  Intake 1395.07 ml  Output -  Net 1395.07 ml     Physical  Exam Patient is examined daily including today on 08/18/18 , exams remain the same as of yesterday except that has changed   Gen:- Awake Alert,  In no apparent distress  HEENT:- [place].AT, No sclera icterus Neck-neck discomfort with range of motion but otherwise neck is mostly supple , no JVD,.  Lungs-  CTAB , good symmetrical air movement CV- S1, S2 normal, regular  Abd-  +ve B.Sounds, Abd Soft, No tenderness,    Extremity/Skin:- No  edema, pedal pulses present, no rash Psych-affect is appropriate, oriented x3 Neuro-no new focal deficits, no tremors   Data Review:   Micro Results Recent Results (from the past 240 hour(s))  Culture, blood (Routine X 2) w Reflex to ID Panel     Status: None (Preliminary result)   Collection Time: 08/27/18 10:53 AM  Result Value Ref Range Status   Specimen Description BLOOD RIGHT ARM  Final   Special Requests   Final    BOTTLES DRAWN AEROBIC AND ANAEROBIC Blood Culture adequate volume   Culture   Final    NO GROWTH 1 DAY Performed at [facility], [place] [address], [place], [place] [address]    Report Status PENDING  Incomplete  Culture, blood (Routine X 2) w Reflex to ID Panel     Status: None (Preliminary result)   Collection Time: 08/27/18 10:53 AM  Result Value Ref Range Status   Specimen Description BLOOD RIGHT ARM  Final   Special Requests   Final    BOTTLES DRAWN AEROBIC ONLY Blood Culture adequate volume   Culture   Final    NO GROWTH 1 DAY Performed at [facility], [place] [address], [place], [place] [address]    Report Status PENDING  Incomplete  CSF culture     Status: None (Preliminary result)   Collection Time: 08/27/18 11:39 AM  Result Value Ref Range Status   Specimen Description CSF  Final   Special Requests NONE  Final   Gram Stain   Final    WBC PRESENT,BOTH PMN AND MONONUCLEAR NO ORGANISMS SEEN CYTOSPIN SMEAR    Culture   Final    NO GROWTH < 24 HOURS Performed at [facility], [place] [address],  [place], [place] [address]    Report Status PENDING  Incomplete    Radiology Reports No results found.   CBC Recent Labs  Lab 08/27/18 0613  WBC 6.7  HGB 12.9  HCT 41.7  PLT 263  MCV 86.3  MCH 26.7  MCHC 30.9  RDW 13.1    Chemistries  Recent  Labs  Lab 08/27/18 0613  NA 137  K 3.9  CL 104  CO2 22  GLUCOSE 138*  BUN 10  CREATININE 0.88  CALCIUM 8.4*  AST 52*  ALT 52*  ALKPHOS 91  BILITOT 0.7   Roxan Hockey M.D on 08/28/2018 at 3:35 PM  Pager---[phone] Go to [email or web address] - password [email or web address] for contact info  [facility] - Office  [phone]

## 2018-08-28 NOTE — Progress Notes (Signed)
Tasha Hansen for Infectious Disease   Reason for visit: Follow up on meningitis  Interval History: feels much better, afebrile now, continues on vancomycin and ceftriaxone.  No creat checked today.    Physical Exam: Constitutional:  Vitals:   08/28/18 0444 08/28/18 1153  BP: 111/80 110/75  Pulse: 91 79  Resp: 20   Temp: 99.9 F (37.7 C) 98.3 F (36.8 C)  SpO2: 96% 100%   patient appears in NAD Respiratory: Normal respiratory effort; CTA B Cardiovascular: RRR GI: soft, nt, nd  Review of Systems: Constitutional: negative for fevers and chills Gastrointestinal: negative for nausea and diarrhea Musculoskeletal: negative for myalgias and arthralgias Neurological: negative for dizziness; some headache still but much improved  Lab Results  Component Value Date   WBC 6.7 08/27/2018   HGB 12.9 08/27/2018   HCT 41.7 08/27/2018   MCV 86.3 08/27/2018   PLT 263 08/27/2018    Lab Results  Component Value Date   CREATININE 0.88 08/27/2018   BUN 10 08/27/2018   NA 137 08/27/2018   K 3.9 08/27/2018   CL 104 08/27/2018   CO2 22 08/27/2018    Lab Results  Component Value Date   ALT 52 (H) 08/27/2018   AST 52 (H) 08/27/2018   ALKPHOS 91 08/27/2018     Microbiology: Recent Results (from the past 240 hour(s))  Culture, blood (Routine X 2) w Reflex to ID Panel     Status: None (Preliminary result)   Collection Time: 08/27/18 10:53 AM  Result Value Ref Range Status   Specimen Description BLOOD RIGHT ARM  Final   Special Requests   Final    BOTTLES DRAWN AEROBIC AND ANAEROBIC Blood Culture adequate volume Performed at [facility], [address], [place], [place] [address]    Culture NO GROWTH < 24 HOURS  Final   Report Status PENDING  Incomplete  Culture, blood (Routine X 2) w Reflex to ID Panel     Status: None (Preliminary result)   Collection Time: 08/27/18 10:53 AM  Result Value Ref Range Status   Specimen Description BLOOD RIGHT ARM  Final   Special  Requests   Final    BOTTLES DRAWN AEROBIC ONLY Blood Culture adequate volume Performed at [facility], [place] [address] [place], [place] [address]    Culture NO GROWTH < 24 HOURS  Final   Report Status PENDING  Incomplete  CSF culture     Status: None (Preliminary result)   Collection Time: 08/27/18 11:39 AM  Result Value Ref Range Status   Specimen Description CSF  Final   Special Requests NONE  Final   Gram Stain   Final    WBC PRESENT,BOTH PMN AND MONONUCLEAR NO ORGANISMS SEEN CYTOSPIN SMEAR    Culture   Final    NO GROWTH < 24 HOURS Performed at [facility], [place] [address], [place], [place] [address]    Report Status PENDING  Incomplete    Impression/Plan:  1. Probable aseptic meningitis - much improved clinically making bacterial meningitis very unlikely.  No other etiology identified.  Will keep her on ceftriaxone for now.  I will stop vancomycin since she had renal issues with it previously.  I would observe another 24 hours since she did have high WBCs and neutrophils.    2. Recurrent meningitis - second episode but no known etiology.  No medications to consider such as NSAIDs or other typical medication-induced meningitis.  Maybe some autoimmune component but  she does not typically have any symptoms of lupus-like issues. Hopefully won't happen again.    3.  Isolation - no isolation indicated since it is not c/w meningococcal meningitis.  I have removed this.

## 2018-08-29 LAB — BASIC METABOLIC PANEL
Anion gap: 8 (ref 5–15)
BUN: 8 mg/dL (ref 6–20)
CO2: 20 mmol/L — ABNORMAL LOW (ref 22–32)
Calcium: 8.1 mg/dL — ABNORMAL LOW (ref 8.9–10.3)
Chloride: 112 mmol/L — ABNORMAL HIGH (ref 98–111)
Creatinine, Ser: 0.74 mg/dL (ref 0.44–1.00)
GFR calc Af Amer: >60 mL/min (ref >60)
GFR calc non Af Amer: >60 mL/min (ref >60)
Glucose, Bld: 141 mg/dL — ABNORMAL HIGH (ref 70–99)
Potassium: 3.8 mmol/L (ref 3.5–5.1)
Sodium: 140 mmol/L (ref 135–145)

## 2018-08-29 LAB — HIV ANTIBODY (ROUTINE TESTING W REFLEX): HIV Screen 4th Generation wRfx: NONREACTIVE

## 2018-08-29 LAB — RPR: RPR Ser Ql: NONREACTIVE

## 2018-08-29 LAB — HSV DNA BY PCR (REFERENCE LAB)
HSV 1 DNA: NEGATIVE
HSV 2 DNA: NEGATIVE

## 2018-08-29 LAB — PATHOLOGIST SMEAR REVIEW

## 2018-08-29 LAB — VDRL, CSF: VDRL Quant, CSF: NONREACTIVE

## 2018-08-29 MED ORDER — ACETAMINOPHEN 325 MG PO TABS: 650.0000 mg | ORAL_TABLET | Freq: Four times a day (QID) | ORAL | 0 refills | Status: DC | PRN

## 2018-08-29 NOTE — Care Management Note (Signed)
Case Management Note  Patient Details  Name: Tasha Hansen MRN: [id number] Date of Birth: 1986/04/16  Subjective/Objective:                    Action/Plan: Pt discharging home with self care. Pt has PCP, insurance and transportation home.   Expected Discharge Date:  08/29/18               Expected Discharge Plan:  Home/Self Care  In-House Referral:     Discharge planning Services     Post Acute Care Choice:    Choice offered to:     DME Arranged:    DME Agency:     HH Arranged:    HH Agency:     Status of Service:  Completed, signed off  If discussed at [employer] of Stay Meetings, dates discussed:    Additional Comments:  Pollie Friar, RN 08/29/2018, 1:41 PM

## 2018-08-29 NOTE — Discharge Summary (Signed)
Tasha Hansen, is a 32 y.o. female  DOB 1986/08/27  MRN [id number].  Admission date:  08/27/2018  Admitting Physician  Venetia Maxon Rama, MD  Discharge Date:  08/29/2018   Primary MD  Marda Stalker, PA-C  Recommendations for primary care physician for things to follow:   1)Please call and make appointment with Dr Lahoma Rocker at [phone] ([address] [place], [place] [address]) to do further testing to exclude the possibility of an autoimmune disorder causing your symptoms 2) your fasting blood sugars are elevated most likely due to steroids/Decadron use--- advised repeat fasting sugar in about 2 to 3 weeks 3) talk to your primary care doctor about getting your vaccination including your meningitis vaccine updated   Admission Diagnosis  Meningitis [G03.9]   Discharge Diagnosis  Meningitis [G03.9]   Principal Problem:   Meningitis Active Problems:   Sepsis [facility])      Past Medical History:  Diagnosis Date  . Allergy   . Anemia   . GERD (gastroesophageal reflux disease)   . Headache    "monthly" (08/27/2018)  . Meningitis 2013  . Migraine    "a few times/year" (08/27/2018)    Past Surgical History:  Procedure Laterality Date  . [place]   as an infant  . WISDOM TOOTH EXTRACTION  age 34       HPI  from the history and physical done on the day of admission:    Tasha Hansen is an 32 y.o. female with a PMH of migraine headaches and a remote history of aseptic meningitis in 2013 who presented to the ED for evaluation of headache and neck pain that has worsened over the past few days.  Headache started about a week ago.  Symptoms associated with mild photophobia but no evidence of rash, visual changes or focal neurological deficits.  Patient has taken various OTC medications without any relief.  Patient reports that her headache pain is similar to when  she had meningitis in the past, although the previous episode was associated with worse neck pain.  ED Course: Upon evaluation, the patient was tachycardic with heart rate ranging from 1 11-124, mildly hypotensive at 95/58, improved to 107/57 and febrile to 101 F.  The patient was given 2 g of IV Rocephin, 10 mg of Decadron, 50 mcg of fentanyl, 15 mg of Toradol, 0.5 mg of Ativan, 10 mg of Reglan, 4 mg of Zofran x2, 2 L of IV fluids followed by normal saline at 125 cc an hour.  [facility] subsequently called to admit.     Hospital Course:    Brief Summary 32 year old female who is 4 months postpartum with prior history of aseptic meningitis in 2013 admitted on 08/27/2018 with headache, photophobia, neck pain, fevers and CSF fluid analysis suggestive of meningitis, cultures negative, suspect aseptic meningitis   Plan:  1)Aseptic Meningitis--ID input appreciated okay to stop vancomycin, treated with meningitic doses of IV Rocephin 2 g every 12 hours, , cultures are negative, final diagnosis is aseptic meningitis,  no further antibiotics needed, patient is afebrile at this time , , patient had leukocytosis will not recheck CBC she is on high-dose Decadron which perpetuated her leukocytosis... No further antibiotics indicated at this time  2)Sepsis--- secondary to #1 above, patient met sepsis criteria on admission with fevers above 101, T-max 103.2, patient is now afebrile, tachycardia and hypotension which resolved-- sepsis pathophysiology has resolved  3) obesity----lifestyle and dietary modifications discussed  4) history of hypertension prior to admission--- patient was on HCTZ prior to admission, BP soft okay to stop HCTZ  Code Status : Full   Family Communication:   Mom at bedside   Disposition Plan  : home  Consults  : Infectious disease  Discharge Condition: Stable  Follow UP  Diet and Activity recommendation:  As advised  Discharge Instructions    Discharge Instructions     Call MD for:  difficulty breathing, headache or visual disturbances   Complete by:  As directed    Call MD for:  persistant dizziness or light-headedness   Complete by:  As directed    Call MD for:  persistant nausea and vomiting   Complete by:  As directed    Call MD for:  severe uncontrolled pain   Complete by:  As directed    Call MD for:  temperature >100.4   Complete by:  As directed    Diet - low sodium heart healthy   Complete by:  As directed    Discharge instructions   Complete by:  As directed    1)Please call and make appointment with Dr Lahoma Rocker at [phone] ([address], [place], [place] [address]) to do further testing to exclude the possibility of an autoimmune disorder causing your symptoms 2) your fasting blood sugars are elevated most likely due to steroids/Decadron use--- advised repeat fasting sugar in about 2 to 3 weeks 3) talk to your primary care doctor about getting your vaccination including your meningitis vaccine updated   Increase activity slowly   Complete by:  As directed         Discharge Medications     Allergies as of 08/29/2018      Reactions   Diphenhydramine Hcl Itching   Oseltamivir Phosphate Itching      Medication List    STOP taking these medications   hydrochlorothiazide 25 MG tablet Commonly known as:  HYDRODIURIL   ibuprofen 600 MG tablet Commonly known as:  ADVIL,MOTRIN   magnesium oxide 400 (241.3 Mg) MG tablet Commonly known as:  MAG-OX   sulfamethoxazole-trimethoprim 800-160 MG tablet Commonly known as:  BACTRIM DS,SEPTRA DS   witch hazel-glycerin pad Commonly known as:  TUCKS     TAKE these medications   acetaminophen 325 MG tablet Commonly known as:  TYLENOL Take 2 tablets (650 mg total) by mouth every 6 (six) hours as needed for mild pain, fever or headache (or Fever >/= 101). What changed:    when to take this  reasons to take this   LOESTRIN 1/20 (21) 1-20 MG-MCG tablet Generic drug:   norethindrone-ethinyl estradiol Take 1 tablet by mouth daily.       Major procedures and Radiology Reports - PLEASE review detailed and final reports for all details, in brief -    No results found.  Micro Results   Recent Results (from the past 240 hour(s))  Culture, blood (Routine X 2) w Reflex to ID Panel     Status: None (Preliminary result)   Collection Time: 08/27/18 10:53 AM  Result Value Ref Range Status   Specimen Description BLOOD RIGHT ARM  Final   Special Requests   Final    BOTTLES DRAWN AEROBIC AND ANAEROBIC Blood Culture adequate volume   Culture   Final    NO GROWTH 1 DAY Performed at [facility], [address] [place], [place] [address]    Report Status PENDING  Incomplete  Culture, blood (Routine X 2) w Reflex to ID Panel     Status: None (Preliminary result)   Collection Time: 08/27/18 10:53 AM  Result Value Ref Range Status   Specimen Description BLOOD RIGHT ARM  Final   Special Requests   Final    BOTTLES DRAWN AEROBIC ONLY Blood Culture adequate volume   Culture   Final    NO GROWTH 1 DAY Performed at [facility], [place] [address] [place], [place] [address]    Report Status PENDING  Incomplete  CSF culture     Status: None (Preliminary result)   Collection Time: 08/27/18 11:39 AM  Result Value Ref Range Status   Specimen Description CSF  Final   Special Requests NONE  Final   Gram Stain   Final    WBC PRESENT,BOTH PMN AND MONONUCLEAR NO ORGANISMS SEEN CYTOSPIN SMEAR    Culture   Final    NO GROWTH 2 DAYS Performed at [facility], [place] [address], [place], [place] [address]    Report Status PENDING  Incomplete       Today   Subjective    Tasha Hansen today has no new complaints, remains afebrile,          Patient has been seen and examined prior to discharge   Objective   Blood pressure 118/88, pulse (!) 50, temperature 98 F (36.7 C), temperature source Oral, resp. rate 16, height _0  (1.651 m),  weight 104.3 kg, last menstrual period 08/25/2018, SpO2 100 %, unknown if currently breastfeeding.  No intake or output data in the 24 hours ending 08/29/18 1226  Exam Gen:- Awake Alert,  In no apparent distress  HEENT:- [place].AT, No sclera icterus Neck-neck discomfort with range of motion is improved significantly, neck is supple no JVD,.  Lungs-  CTAB , good symmetrical air movement CV- S1, S2 normal, regular  Abd-  +ve B.Sounds, Abd Soft, No tenderness,    Extremity/Skin:- No  edema, pedal pulses present, no rash Psych-affect is appropriate, oriented x3 Neuro-no new focal deficits, no tremors   Data Review   CBC w Diff:  Lab Results  Component Value Date   WBC 6.7 08/27/2018   HGB 12.9 08/27/2018   HGB 12.7 10/14/2014   HCT 41.7 08/27/2018   PLT 263 08/27/2018   LYMPHOPCT 40 10/27/2013   MONOPCT 9 10/27/2013   EOSPCT 1 10/27/2013   BASOPCT 0 10/27/2013    CMP:  Lab Results  Component Value Date   NA 140 08/29/2018   K 3.8 08/29/2018   CL 112 (H) 08/29/2018   CO2 20 (L) 08/29/2018   BUN 8 08/29/2018   CREATININE 0.74 08/29/2018   CREATININE 0.71 10/27/2013   PROT 7.4 08/27/2018   ALBUMIN 3.2 (L) 08/27/2018   BILITOT 0.7 08/27/2018   ALKPHOS 91 08/27/2018   AST 52 (H) 08/27/2018   ALT 52 (H) 08/27/2018  .   Total Discharge time is about 33 minutes  Roxan Hockey M.D on 08/29/2018 at 12:26 PM  Pager---[phone]  Go to [email or web address] - password [email or web address] for contact info  [facility] - Office  [phone]

## 2018-08-29 NOTE — Progress Notes (Signed)
Raynelle L Pottle to be D/C'd Home per MD order.  Discussed with the patient and all questions fully answered.  VSS, Skin clean, dry and intact without evidence of skin break down, no evidence of skin tears noted. IV catheter discontinued intact. Site without signs and symptoms of complications. Dressing and pressure applied.  An After Visit Summary was printed and given to the patient. No prescriptions needed. Patient provided work note per MD [place].   D/c education completed with patient/family including follow up instructions, medication list, d/c activities limitations if indicated, with other d/c instructions as indicated by MD - patient able to verbalize understanding, all questions fully answered.   Patient instructed to return to ED, call 911, or call MD for any changes in condition.   Patient escorted via [place], and D/C home via private auto.  Howard Pouch 08/29/2018 2:07 PM

## 2018-08-29 NOTE — Discharge Instructions (Signed)
1)Please call and make appointment with Dr Lahoma Rocker at [phone] ([address] [place], [place] [address]) to do further testing to exclude the possibility of an autoimmune disorder causing your symptoms 2) your fasting blood sugars are elevated most likely due to steroids/Decadron use--- advised repeat fasting sugar in about 2 to 3 weeks 3) talk to your primary care doctor about getting your vaccination including your meningitis vaccine updated

## 2018-08-30 LAB — CSF CULTURE

## 2018-08-30 LAB — CSF CULTURE W GRAM STAIN: Culture: NO GROWTH

## 2018-09-01 ENCOUNTER — Encounter (HOSPITAL_COMMUNITY): Payer: Self-pay | Admitting: Family Medicine

## 2018-09-01 ENCOUNTER — Ambulatory Visit (HOSPITAL_COMMUNITY)
Admission: EM | Admit: 2018-09-01 | Discharge: 2018-09-01 | Disposition: A | Discharge status: Home / Self Care | Payer: 59 | Attending: Family Medicine | Admitting: Family Medicine

## 2018-09-01 ENCOUNTER — Other Ambulatory Visit: Payer: Self-pay

## 2018-09-01 DIAGNOSIS — G971 Other reaction to spinal and lumbar puncture: Secondary | ICD-10-CM | POA: Diagnosis not present

## 2018-09-01 LAB — CULTURE, BLOOD (ROUTINE X 2)
Culture: NO GROWTH
Culture: NO GROWTH
Special Requests: ADEQUATE
Special Requests: ADEQUATE

## 2018-09-01 MED ORDER — HYDROCODONE-ACETAMINOPHEN 5-325 MG PO TABS: 1.0000 | ORAL_TABLET | Freq: Four times a day (QID) | ORAL | 0 refills | Status: DC | PRN

## 2018-09-01 NOTE — ED Provider Notes (Signed)
[facility]    CSN: [id number] Arrival date & time: 09/01/18  1000     History   Chief Complaint Chief Complaint  Patient presents with  . Follow-up  . Headache    HPI Tasha Hansen is a 32 y.o. female.   This is the initial visit for this 32 year old woman.  She was discharged from the hospital on the day after Christmas for meningitis.  She has had a headache from the LP and she was told to come here when she called her private doctor.  Patient continues to have a headache but she has no stiffness.  She is had no fever.  She is moving all 4 extremities equally and her headache only bothers her when she sits up.     Past Medical History:  Diagnosis Date  . Allergy   . Anemia   . GERD (gastroesophageal reflux disease)   . Headache    "monthly" (08/27/2018)  . Meningitis 2013  . Migraine    "a few times/year" (08/27/2018)    Patient Active Problem List   Diagnosis Date Noted  . Sepsis ([place]) 08/27/2018  . SVD (spontaneous vaginal delivery) 04/16/2018  . Postpartum care following vaginal delivery (8/12) 04/16/2018  . Second-degree perineal laceration, with delivery 04/16/2018  . Encounter for planned induction of labor 04/15/2018  . Term pregnancy 04/15/2018  . Prolonged latent phase of labor 04/05/2018  . Meningitis 11/29/2011    Past Surgical History:  Procedure Laterality Date  . [place]   as an infant  . WISDOM TOOTH EXTRACTION  age 13    OB History    Gravida  1   Para  1   Term  1   Preterm      AB      Living  1     SAB      TAB      Ectopic      Multiple  0   Live Births  1            Home Medications    Prior to Admission medications   Medication Sig Start Date End Date Taking? Authorizing Provider  acetaminophen (TYLENOL) 325 MG tablet Take 2 tablets (650 mg total) by mouth every 6 (six) hours as needed for mild pain, fever or headache (or Fever >/= 101). 08/29/18  Yes Roxan Hockey, MD  Multiple Vitamins-Minerals (MULTIVITAMIN PO) Take by mouth.   Yes [provider]  norethindrone-ethinyl estradiol (LOESTRIN 1/20, 21,) 1-20 MG-MCG tablet Take 1 tablet by mouth daily.   Yes [provider]  HYDROcodone-acetaminophen (NORCO) 5-325 MG tablet Take 1 tablet by mouth every 6 (six) hours as needed for moderate pain. 09/01/18   Robyn Haber, MD    Family History Family History  Problem Relation Age of Onset  . Hypertension Father   . Hypertension Maternal Grandmother   . Diabetes Maternal Grandmother   . Hypertension Maternal Grandfather   . Diabetes Maternal Grandfather   . Hypertension Paternal Grandmother   . Diabetes Paternal Grandmother   . Diabetes Paternal Grandfather   . Hypertension Paternal Grandfather     Social History Social History   Tobacco Use  . Smoking status: Never Smoker  . Smokeless tobacco: Never Used  Substance Use Topics  . Alcohol use: Never    Alcohol/week: 0.0 standard drinks    Frequency: Never  . Drug use: Never     Allergies   Diphenhydramine hcl and Oseltamivir phosphate  Review of Systems Review of Systems  Neurological: Positive for headaches.  All other systems reviewed and are negative.    Physical Exam Triage Vital Signs ED Triage Vitals [09/01/18 1020]  Enc Vitals Group     BP 104/81     Pulse Rate 90     Resp 16     Temp 98.2 F (36.8 C)     Temp Source Oral     SpO2 97 %     Weight      Height      Head Circumference      Peak Flow      Pain Score 7     Pain Loc      Pain Edu?      Excl. in [place]?    No data found.  Updated Vital Signs BP 104/81   Pulse 90   Temp 98.2 F (36.8 C) (Oral)   Resp 16   LMP 08/25/2018   SpO2 97%   Breastfeeding No    Physical Exam Vitals signs and nursing note reviewed.  Constitutional:      Appearance: She is well-developed. She is obese.  HENT:     Head: Normocephalic and atraumatic.  Eyes:     Extraocular Movements:  Extraocular movements intact.     Pupils: Pupils are equal, round, and reactive to light.  Neck:     Musculoskeletal: Normal range of motion and neck supple.  Pulmonary:     Effort: Pulmonary effort is normal.  Musculoskeletal: Normal range of motion.  Skin:    General: Skin is warm and dry.  Neurological:     Mental Status: She is alert.     Sensory: No sensory deficit.     Motor: No weakness.     Coordination: Romberg sign negative. Coordination normal.  Psychiatric:        Mood and Affect: Mood normal.        Speech: Speech normal.        Behavior: Behavior normal.      UC Treatments / Results  Labs (all labs ordered are listed, but only abnormal results are displayed) Labs Reviewed - No data to display  EKG None  Radiology No results found.  Procedures Procedures (including critical care time)  Medications Ordered in UC Medications - No data to display  Initial Impression / Assessment and Plan / UC Course  I have reviewed the triage vital signs and the nursing notes.  Pertinent labs & imaging results that were available during my care of the patient were reviewed by me and considered in my medical decision making (see chart for details).    Final Clinical Impressions(s) / UC Diagnoses   Final diagnoses:  Spinal headache   Discharge Instructions   None    ED Prescriptions    Medication Sig Dispense Auth. Provider   HYDROcodone-acetaminophen (NORCO) 5-325 MG tablet Take 1 tablet by mouth every 6 (six) hours as needed for moderate pain. 20 tablet Robyn Haber, MD     Controlled Substance Prescriptions [place] Controlled Substance Registry consulted? Not Applicable   Robyn Haber, MD 09/01/18 1032

## 2018-09-01 NOTE — ED Triage Notes (Addendum)
Had LP done 12/24 - dx meningitis, D/C from hospital 12/26.  C/O HA from LP.  Called hospital unit she was on, was told to go to [facility].  Pain improves when laying flat.

## 2018-09-02 ENCOUNTER — Other Ambulatory Visit: Payer: Self-pay | Admitting: *Deleted

## 2018-09-02 NOTE — Patient Outreach (Signed)
Transition of care call completed. Pt is improving despite having to return to ED yesterday for HA. Her pain in managed now with Rx given. She will see her primary care provider on Thursday.  No further needs were assessed.  I have given pt Kelli Churn, RN's phone number for future reference.  Eulah Pont. Myrtie Neither, MSN, [facility] Gerontological Nurse Practitioner [facility] Care Management [phone]

## 2018-09-05 DIAGNOSIS — G030 Nonpyogenic meningitis: Secondary | ICD-10-CM | POA: Diagnosis not present

## 2018-09-19 DIAGNOSIS — M62830 Muscle spasm of back: Secondary | ICD-10-CM | POA: Diagnosis not present

## 2018-10-15 ENCOUNTER — Encounter: Payer: Self-pay | Admitting: Allergy & Immunology

## 2018-10-15 ENCOUNTER — Ambulatory Visit: Payer: 59 | Admitting: Allergy & Immunology

## 2018-10-15 VITALS — BP 130/86 | HR 83 | Temp 98.4°F | Resp 18 | Ht 64.0 in | Wt 234.0 lb

## 2018-10-15 DIAGNOSIS — G032 Benign recurrent meningitis [Mollaret]: Secondary | ICD-10-CM

## 2018-10-15 DIAGNOSIS — B999 Unspecified infectious disease: Secondary | ICD-10-CM

## 2018-10-15 NOTE — Patient Instructions (Addendum)
1. Recurrent aseptic meningitis - Your history of recurrent infections is rather odd and might just be a one off. - We will obtain some screening labs to evaluate your immune system.  - Labs to evaluate the quantitative [facility]) aspects of your immune system: IgG/IgA/IgM, CBC with differential - Labs to evaluate the qualitative ([place]) aspects of your immune system: AH50, CH50, Pneumococcal titers, Tetanus titers, Diphtheria titers - We are also going to rule out autoimmune causes of your symptoms with inflammatory markers and ANA levels.  - We may consider immunizations with Pneumovax and Tdap to challenge her immune system, and then obtain repeat titers in 4-6 weeks.  - We are going to make sure that your T cells are working appropriately.   2. Return in about 3 months (around 01/13/2019).   Please inform us of any Emergency Department visits, hospitalizations, or changes in symptoms. Call us before going to the ED for breathing or allergy symptoms since we might be able to fit you in for a sick visit. Feel free to contact us anytime with any questions, problems, or concerns.  It was a pleasure to meet you and your family today!  Websites that have reliable patient information: 1. American Academy of Asthma, Allergy, and Immunology: www.aaaai.org 2. Food Allergy Research and Education (FARE): foodallergy.org 3. Mothers of Asthmatics: http://www.asthmacommunitynetwork.org 4. American College of Allergy, Asthma, and Immunology: MonthlyElectricBill.co.uk   Make sure you are registered to vote! If you have moved or changed any of your contact information, you will need to get this updated before voting!    Voter ID laws are POSSIBLY going into effect for the General Election in November 2020! Be prepared! Check out http://levine.com/ for more details.

## 2018-10-15 NOTE — Progress Notes (Signed)
NEW PATIENT  Date of Service/Encounter:  10/15/18  Referring provider: Marda Stalker, PA-C   Assessment:   Recurrent aseptic meningitis  Recurrent infections   [name] presents for an evaluation of recurrent episodes of aseptic meningitis.  These are separated by 7 years each, and in the intervening years she has had no problems with infections.  The only antibiotics that she has ever need are for a toe infection.  In fact, her most recent hospitalization was preceded by a course of Bactrim prescribed for a toe infection.  She had nothing grow out of her CSF culture or blood culture, but her counts are certainly consistent with the diagnosis of aseptic meningitis.  This could have been a viral etiology that was not appreciated with the processing parameters.  I do not know of any immune deficiency that presents with recurrent aseptic meningitis, although HIV infections are known to cause this.  She has had negative HIV test in the past, including one most recent in December 2019.  I do not think we need to repeat this at this time.  We are going to get an immune screen, especially to make sure that her complement system is in place.  Her vaccinations are up-to-date, so I anticipate that the vaccine titers will be normal.  Evidently, there was also concern that this might have been related to an autoimmune issue, so we will get inflammatory markers and ANA.  Drugs can also cause aseptic meningitis, most notably IVIG.  She has not been on any drugs associated with this, but I will make sure that her birth control pill is not associated with aseptic meningitis.  This is certainly lower on the differential.  Plan/Recommendations:   1. Recurrent aseptic meningitis - Your history of recurrent infections is rather odd and might just be a one off. - We will obtain some screening labs to evaluate your immune system.  - Labs to evaluate the quantitative [facility]) aspects of your immune  system: IgG/IgA/IgM, CBC with differential - Labs to evaluate the qualitative ([place]) aspects of your immune system: AH50, CH50, Pneumococcal titers, Tetanus titers, Diphtheria titers - We are also going to rule out autoimmune causes of your symptoms with inflammatory markers and ANA levels.  - We may consider immunizations with Pneumovax and Tdap to challenge her immune system, and then obtain repeat titers in 4-6 weeks.  - We are going to make sure that your T cells are working appropriately.   2. Return in about 3 months (around 01/13/2019).  Subjective:   Tasha Hansen is a 33 y.o. female presenting today for evaluation of  Chief Complaint  Patient presents with  . [place] has a history of the following: Patient Active Problem List   Diagnosis Date Noted  . Sepsis ([place]) 08/27/2018  . SVD (spontaneous vaginal delivery) 04/16/2018  . Postpartum care following vaginal delivery (8/12) 04/16/2018  . Second-degree perineal laceration, with delivery 04/16/2018  . Encounter for planned induction of labor 04/15/2018  . Term pregnancy 04/15/2018  . Prolonged latent phase of labor 04/05/2018  . Meningitis 11/29/2011   She is fully vaccinated. History obtained from: chart review and patient and mother.  Tasha Hansen was referred by Marda Stalker, PA-C.     Tasha Hansen is a 33 y.o. female presenting for an evaluation of recurrent aseptic meningitis.  She was recently admitted to the hospital around Christmas 2019.  She had white blood count  in December 2019 up to a max of 363.  She presented with headache and nausea. She works in Engineer, production. She did have some bloodshot red eyes. She did have some nausea and less neck stiffness. She was on vancomycin and Rocephin initially, but then transition to Rocephin alone every 12 hours.  Cultures were negative.  Her tap was colorless with a red blood count of only 2.  She was  placed on high-dose Decadron.  She was febrile with a T-max to 103.2. She saw Dr. Linus Salmons in the hospital with Infectious Disease.    She did undergo HIV testing which was negative. She is only on the LoEsterin. She was on that in 2013 as well. She was on this when she was a teenager. She is now six months postpartum.   Prior to the hospitalization, she was on an antibiotic for a toe infection started in early December 2019. This is the only antibiotic that she has had recently. She was on on Bactrim.       She had a separate episode in 2013.  White blood cell count and her CSF was 148 at that time. 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 She did have fevers with this. She was on antibiotics for one week while she was hospitalized. There was no immune workup at that time.   She was referred to Rheumatology who declined to see her. She has had ingrown toe nails treated with Bactrim.  Otherwise, her infectious history is unremarkable.  She does have some seasonal allergies in the spring, which is controlled with Zyrtec.  Mom did have viral meningitis when she was in college.   Otherwise, there is no history of other atopic diseases, including asthma, food allergies, drug allergies, stinging insect allergies, eczema or urticaria. There is no significant infectious history. Vaccinations are up to date.    Past Medical History: Patient Active Problem List   Diagnosis Date Noted  . Sepsis ([place]) 08/27/2018  . SVD (spontaneous vaginal delivery) 04/16/2018  . Postpartum care following vaginal delivery (8/12) 04/16/2018  . Second-degree perineal laceration, with delivery 04/16/2018  . Encounter for planned induction of labor 04/15/2018  . Term pregnancy 04/15/2018  . Prolonged latent phase of labor 04/05/2018  . Meningitis 11/29/2011    Medication List:  Allergies as of 10/15/2018      Reactions   Diphenhydramine Hcl Itching    Oseltamivir Phosphate Itching      Medication List       Accurate as of October 15, 2018  1:45 PM. Always use your most recent med list.        acetaminophen 325 MG tablet Commonly known as:  TYLENOL Take 2 tablets (650 mg total) by mouth every 6 (six) hours as needed for mild pain, fever or headache (or Fever >/= 101).   cetirizine 10 MG tablet Commonly known as:  ZYRTEC Take 10 mg by mouth daily.   LOESTRIN 1/20 (21) 1-20 MG-MCG tablet Generic drug:  norethindrone-ethinyl estradiol Take 1 tablet by mouth daily.   MULTIVITAMIN PO Take by mouth.       Birth History: born at term without complications  Developmental History: Ruta has met all milestones on time. She has required no speech therapy, occupational therapy and physical therapy.    Past Surgical History: Past Surgical History:  Procedure Laterality Date  . [place]   as an infant  .  WISDOM TOOTH EXTRACTION  age 78     Family History: Family History  Problem Relation Age of Onset  . Hypertension Father   . Hypertension Maternal Grandmother   . Diabetes Maternal Grandmother   . Hypertension Maternal Grandfather   . Diabetes Maternal Grandfather   . Hypertension Paternal Grandmother   . Diabetes Paternal Grandmother   . Diabetes Paternal Grandfather   . Hypertension Paternal Grandfather      Social History: Jerusha lives at home with her family.  They live in a 32 year old home.  They have wood in the main living areas and carpeting in the bedrooms.  They have gas heating and central cooling.  She does have dust mite covers on her bedding.  There is no tobacco exposure.  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.  She has been there for 6-1/2 years.   Review of Systems  Constitutional: Negative.  Negative for fever, malaise/fatigue and weight loss.  HENT: Negative.  Negative for congestion, ear discharge, ear pain and sinus pain.   Eyes: Negative  for pain, discharge and redness.  Respiratory: Negative for cough, sputum production, shortness of breath and wheezing.   Cardiovascular: Negative.  Negative for chest pain and palpitations.  Gastrointestinal: Negative for abdominal pain and heartburn.  Skin: Negative.  Negative for itching and rash.  Neurological: Negative for dizziness and headaches.  Endo/Heme/Allergies: Negative for environmental allergies. Does not bruise/bleed easily.       Objective:   Blood pressure 130/86, pulse 83, temperature 98.4 F (36.9 C), temperature source Oral, resp. rate 18, height _0  (1.626 m), weight 234 lb (106.1 kg), SpO2 97 %, not currently breastfeeding. Body mass index is 40.17 kg/m.   Physical Exam:   Physical Exam  Constitutional: She appears well-developed.  HENT:  Head: Normocephalic and atraumatic.  Right Ear: Tympanic membrane, external ear and ear canal normal. No drainage, swelling or tenderness. Tympanic membrane is not injected, not scarred, not erythematous, not retracted and not bulging.  Left Ear: Tympanic membrane, external ear and ear canal normal. No drainage, swelling or tenderness. Tympanic membrane is not injected, not scarred, not erythematous, not retracted and not bulging.  Nose: No mucosal edema, rhinorrhea, nasal deformity or septal deviation. No epistaxis. Right sinus exhibits no maxillary sinus tenderness and no frontal sinus tenderness. Left sinus exhibits no maxillary sinus tenderness and no frontal sinus tenderness.  Mouth/Throat: Uvula is midline and oropharynx is clear and moist. Mucous membranes are not pale and not dry.  Eyes: Pupils are equal, round, and reactive to light. Conjunctivae and EOM are normal. Right eye exhibits no chemosis and no discharge. Left eye exhibits no chemosis and no discharge. Right conjunctiva is not injected. Left conjunctiva is not injected.  Cardiovascular: Normal rate, regular rhythm and normal heart sounds.  Respiratory:  Effort normal and breath sounds normal. No accessory muscle usage. No tachypnea. No respiratory distress. She has no wheezes. She has no rhonchi. She has no rales. She exhibits no tenderness.  GI: There is no abdominal tenderness. There is no rebound and no guarding.  Lymphadenopathy:       Head (right side): No submandibular, no tonsillar and no occipital adenopathy present.       Head (left side): No submandibular, no tonsillar and no occipital adenopathy present.    She has no cervical adenopathy.  Neurological: She is alert.  Skin: No abrasion, no petechiae and no rash noted. Rash is not papular, not vesicular and not  urticarial. No erythema. No pallor.  Psychiatric: She has a normal mood and affect.     Diagnostic studies: none       Salvatore Marvel, MD Allergy and [place] [facility] [place]

## 2018-10-18 DIAGNOSIS — G032 Benign recurrent meningitis [Mollaret]: Secondary | ICD-10-CM | POA: Diagnosis not present

## 2018-10-31 ENCOUNTER — Telehealth: Payer: Self-pay | Admitting: Allergy & Immunology

## 2018-10-31 NOTE — Telephone Encounter (Signed)
Dr. Ernst Bowler, it appears that some labs are back but there are some still pending. Please advise and thank you.

## 2018-10-31 NOTE — Telephone Encounter (Signed)
Pt called to see if we had the lab results yet? [phone].

## 2018-11-01 NOTE — Telephone Encounter (Signed)
Referral has been placed to Dr Anette Guarneri office for Rheumatology. I have also faxed her labs and your not to her PCP.   Thanks

## 2018-11-01 NOTE — Addendum Note (Signed)
Addended by: Valentina Shaggy on: 11/01/2018 10:07 AM   Modules accepted: Orders

## 2018-11-01 NOTE — Telephone Encounter (Signed)
-----  Message from Valentina Shaggy, MD sent at 11/01/2018  8:53 AM EST ----- I called the patient to discuss her lab results.  She did call me back very quickly.  I went over the results in detail and she is certain understanding.  #1.  Immune work-up: First we looked at her immunoglobulin levels.  These were all fairly normal.  Her IgG level was elevated, which is likely a result of her underlying inflammation.  We also looked at how well these antibodies worked by testing her response to routine vaccinations.  She was protective to tetanus and diphtheria.  However, she was only protective to 7 out of 24 types of Streptococcus pneumonia, which is a bacteria that causes ear infections, sinus infections, and pneumonias.  I think she needs to get a Pneumovax and we can retest her titers in 4 to 6 weeks.  She is going to get a Pneumovax at her next appointment with her PA next week.  I will forward these results to her PA so that she is in the loop..  We also looked at how well her complement pathway work, which is a system that helps immune system work better.  Her classic complement pathway panel was normal, and the alternative complement pathway panel is pending.  We also looked at how well her T cells work since she had that history of multiple episodes of aseptic meningitis.  Her T-cell function was normal.  #2.  We also looked at some autoimmune labs.  She had elevated ESR and CRP.  We sent an ANA with reflex and that came back positive for anti-Ro antibodies, which are present in lupus and Sjogren's syndrome.  Because of this, we are going to refer her to rheumatology for an evaluation.  Dee - I am going to put in a Rheumatology referral. Also, can you print off these labs and get them to her PCP with my note above? Thanks!   Salvatore Marvel, MD Allergy and [place] [facility] [place]

## 2018-11-01 NOTE — Telephone Encounter (Signed)
Awesome thanks much.  Tasha Marvel, MD Allergy and [place] [facility] [place]

## 2018-11-01 NOTE — Progress Notes (Signed)
Order placed for Rheumatology referral.  Salvatore Marvel, MD Allergy and [place] [facility]

## 2018-11-04 NOTE — Telephone Encounter (Signed)
I spoke with patient to inform her that I was faxing her referral over to [facility] Rheumatology. Patient states she wanted to check with PCP first and let them handle the referral.   I let her know I did fax all the lab results to their office and if they did not get it to please give [place] a call and we will refax.   Thanks

## 2018-11-06 NOTE — Telephone Encounter (Signed)
That is fine with me.  Raenell Mensing, MD [facility] and [facility] [place]  

## 2018-11-07 DIAGNOSIS — Z0000 Encounter for general adult medical examination without abnormal findings: Secondary | ICD-10-CM | POA: Diagnosis not present

## 2018-11-07 DIAGNOSIS — E669 Obesity, unspecified: Secondary | ICD-10-CM | POA: Diagnosis not present

## 2018-11-12 DIAGNOSIS — Z113 Encounter for screening for infections with a predominantly sexual mode of transmission: Secondary | ICD-10-CM | POA: Diagnosis not present

## 2018-11-12 DIAGNOSIS — Z6839 Body mass index (BMI) 39.0-39.9, adult: Secondary | ICD-10-CM | POA: Diagnosis not present

## 2018-11-12 DIAGNOSIS — Z01419 Encounter for gynecological examination (general) (routine) without abnormal findings: Secondary | ICD-10-CM | POA: Diagnosis not present

## 2018-11-15 LAB — COMPLEMENT, TOTAL: Compl, Total (CH50): >60 U/mL (ref >41)

## 2018-11-15 LAB — STREP PNEUMONIAE 23 SEROTYPES IGG
Pneumo Ab Type 1*: 0.3 ug/mL — ABNORMAL LOW (ref >1.3)
Pneumo Ab Type 12 (12F)*: 0.3 ug/mL — ABNORMAL LOW (ref >1.3)
Pneumo Ab Type 14*: 1.4 ug/mL (ref >1.3)
Pneumo Ab Type 17 (17F)*: 5.6 ug/mL (ref >1.3)
Pneumo Ab Type 19 (19F)*: 8.1 ug/mL (ref >1.3)
Pneumo Ab Type 2*: 0.2 ug/mL — ABNORMAL LOW (ref >1.3)
Pneumo Ab Type 20*: 0.9 ug/mL — ABNORMAL LOW (ref >1.3)
Pneumo Ab Type 22 (22F)*: 1.1 ug/mL — ABNORMAL LOW (ref >1.3)
Pneumo Ab Type 23 (23F)*: 0.2 ug/mL — ABNORMAL LOW (ref >1.3)
Pneumo Ab Type 26 (6B)*: 0.6 ug/mL — ABNORMAL LOW (ref >1.3)
Pneumo Ab Type 3*: 8.2 ug/mL (ref >1.3)
Pneumo Ab Type 34 (10A)*: 3.3 ug/mL (ref >1.3)
Pneumo Ab Type 4*: 0.3 ug/mL — ABNORMAL LOW (ref >1.3)
Pneumo Ab Type 43 (11A)*: 0.1 ug/mL — ABNORMAL LOW (ref >1.3)
Pneumo Ab Type 5*: 0.5 ug/mL — ABNORMAL LOW (ref >1.3)
Pneumo Ab Type 51 (7F)*: 1.1 ug/mL — ABNORMAL LOW (ref >1.3)
Pneumo Ab Type 54 (15B)*: 0.3 ug/mL — ABNORMAL LOW (ref >1.3)
Pneumo Ab Type 56 (18C)*: 0.6 ug/mL — ABNORMAL LOW (ref >1.3)
Pneumo Ab Type 57 (19A)*: 7.3 ug/mL (ref >1.3)
Pneumo Ab Type 68 (9V)*: 0.4 ug/mL — ABNORMAL LOW (ref >1.3)
Pneumo Ab Type 70 (33F)*: 1.7 ug/mL (ref >1.3)
Pneumo Ab Type 8*: 0.6 ug/mL — ABNORMAL LOW (ref >1.3)
Pneumo Ab Type 9 (9N)*: 0.3 ug/mL — ABNORMAL LOW (ref >1.3)

## 2018-11-15 LAB — DIPHTHERIA / TETANUS ANTIBODY PANEL
Diphtheria Ab: 1.41 IU/mL (ref <0.10)
Tetanus Ab, IgG: 2.87 IU/mL (ref <0.10)

## 2018-11-15 LAB — LYMPHOCYTE ACT. PROFILE
% CD 3 Pos. Lymph.: 77.9 % (ref 57.5–86.2)
% CD 4 Pos. Lymph.: 46.9 % (ref 30.8–58.5)
% CD 8 Pos. Lymph.: 29.8 % (ref 12.0–35.5)
% CD3+CD25+ Lymphs: 19.1 % (ref 4.9–25.9)
% CD8+CD57+ Lymphs: 6.2 % (ref 0.0–11.3)
Abs.CD3+CD25+ Lymphs: 306 /uL (ref 79–535)
Abs.CD8+CD57+ Lymphs: 99 /uL (ref 0–254)
Absolute CD 3: 1246 /uL (ref 622–2402)
Absolute CD 4 Helper: 750 /uL (ref 359–1519)
Absolute CD 8 (Supp): 477 /uL (ref 109–897)
Basophils Absolute: 0 10*3/uL (ref 0.0–0.2)
Basos: 0 %
CD4/CD8 Ratio: 1.57 (ref 0.92–3.72)
EOS (ABSOLUTE): 0.1 10*3/uL (ref 0.0–0.4)
Eos: 1 %
Hematocrit: 39.5 % (ref 34.0–46.6)
Hemoglobin: 12.8 g/dL (ref 11.1–15.9)
Immature Grans (Abs): 0 10*3/uL (ref 0.0–0.1)
Immature Granulocytes: 0 %
Lymphocytes Absolute: 1.6 10*3/uL (ref 0.7–3.1)
Lymphs: 36 %
MCH: 27.6 pg (ref 26.6–33.0)
MCHC: 32.4 g/dL (ref 31.5–35.7)
MCV: 85 fL (ref 79–97)
Monocytes Absolute: 0.4 10*3/uL (ref 0.1–0.9)
Monocytes: 9 %
Neutrophils Absolute: 2.3 10*3/uL (ref 1.4–7.0)
Neutrophils: 54 %
Platelets: 375 10*3/uL (ref 150–450)
RBC: 4.63 x10E6/uL (ref 3.77–5.28)
RDW: 12.9 % (ref 11.7–15.4)
WBC: 4.4 10*3/uL (ref 3.4–10.8)

## 2018-11-15 LAB — ANA W/REFLEX IF POSITIVE
Anti JO-1: <0.2 AI (ref 0.0–0.9)
Anti Nuclear Antibody(ANA): POSITIVE — AB
Centromere Ab Screen: <0.2 AI (ref 0.0–0.9)
Chromatin Ab SerPl-aCnc: <0.2 AI (ref 0.0–0.9)
ENA RNP Ab: 0.2 AI (ref 0.0–0.9)
ENA SM Ab Ser-aCnc: 0.2 AI (ref 0.0–0.9)
ENA SSA (RO) Ab: >8 AI — ABNORMAL HIGH (ref 0.0–0.9)
ENA SSB (LA) Ab: <0.2 AI (ref 0.0–0.9)
Scleroderma (Scl-70) (ENA) Antibody, IgG: 0.6 AI (ref 0.0–0.9)
dsDNA Ab: 1 IU/mL (ref 0–9)

## 2018-11-15 LAB — ALTERNATIVE PATHWAY (AH50): Alternative Pathway (AH50): 227 Units/mL — ABNORMAL HIGH (ref 77–159)

## 2018-11-15 LAB — IGG, IGA, IGM
IgA/Immunoglobulin A, Serum: 210 mg/dL (ref 87–352)
IgG (Immunoglobin G), Serum: 1913 mg/dL — ABNORMAL HIGH (ref 700–1600)
IgM (Immunoglobulin M), Srm: 70 mg/dL (ref 26–217)

## 2018-11-15 LAB — SEDIMENTATION RATE: Sed Rate: 54 mm/hr — ABNORMAL HIGH (ref 0–32)

## 2018-11-15 LAB — C-REACTIVE PROTEIN: CRP: 12 mg/L — ABNORMAL HIGH (ref 0–10)

## 2018-11-16 ENCOUNTER — Ambulatory Visit: Payer: 59 | Admitting: Podiatry

## 2018-11-22 DIAGNOSIS — Z1159 Encounter for screening for other viral diseases: Secondary | ICD-10-CM | POA: Diagnosis not present

## 2018-11-22 DIAGNOSIS — Z113 Encounter for screening for infections with a predominantly sexual mode of transmission: Secondary | ICD-10-CM | POA: Diagnosis not present

## 2018-11-22 DIAGNOSIS — Z114 Encounter for screening for human immunodeficiency virus [HIV]: Secondary | ICD-10-CM | POA: Diagnosis not present

## 2019-04-28 DIAGNOSIS — H6980 Other specified disorders of Eustachian tube, unspecified ear: Secondary | ICD-10-CM | POA: Diagnosis not present

## 2019-04-28 DIAGNOSIS — J309 Allergic rhinitis, unspecified: Secondary | ICD-10-CM | POA: Diagnosis not present

## 2019-06-12 DIAGNOSIS — Z23 Encounter for immunization: Secondary | ICD-10-CM | POA: Diagnosis not present

## 2019-07-08 ENCOUNTER — Ambulatory Visit: Payer: 59 | Admitting: Sports Medicine

## 2019-07-08 ENCOUNTER — Other Ambulatory Visit: Payer: Self-pay

## 2019-07-08 ENCOUNTER — Encounter: Payer: Self-pay | Admitting: Sports Medicine

## 2019-07-08 DIAGNOSIS — L600 Ingrowing nail: Secondary | ICD-10-CM | POA: Diagnosis not present

## 2019-07-08 DIAGNOSIS — M79674 Pain in right toe(s): Secondary | ICD-10-CM | POA: Diagnosis not present

## 2019-07-08 MED ORDER — NEOMYCIN-POLYMYXIN-HC 3.5-10000-1 OT SOLN: OTIC | 0 refills | Status: DC

## 2019-07-08 NOTE — Progress Notes (Signed)
Subjective: Tasha Hansen is a 33 y.o. female patient presents to office today complaining of a moderately painful incurvated, red, hot, swollen lateral nail border of the first toe on the right foot. This has been present for years off and on. Patient has treated this by pedicures and self trimming. Patient denies fever/chills/nausea/vomitting/any other related constitutional symptoms at this time.  Review of Systems  All other systems reviewed and are negative.   Patient Active Problem List   Diagnosis Date Noted  . Sepsis ([place]) 08/27/2018  . SVD (spontaneous vaginal delivery) 04/16/2018  . Postpartum care following vaginal delivery (8/12) 04/16/2018  . Second-degree perineal laceration, with delivery 04/16/2018  . Encounter for planned induction of labor 04/15/2018  . Term pregnancy 04/15/2018  . Prolonged latent phase of labor 04/05/2018  . Meningitis 11/29/2011    Current Outpatient Medications on File Prior to Visit  Medication Sig Dispense Refill  . acetaminophen (TYLENOL) 325 MG tablet Take 2 tablets (650 mg total) by mouth every 6 (six) hours as needed for mild pain, fever or headache (or Fever >/= 101). 15 tablet 0  . cetirizine (ZYRTEC) 10 MG tablet Take 10 mg by mouth daily.    . Multiple Vitamins-Minerals (MULTIVITAMIN PO) Take by mouth.    . norethindrone-ethinyl estradiol (LOESTRIN 1/20, 21,) 1-20 MG-MCG tablet Take 1 tablet by mouth daily.     No current facility-administered medications on file prior to visit.     Allergies  Allergen Reactions  . Diphenhydramine Hcl Itching  . Oseltamivir Phosphate Itching    Objective:  There were no vitals filed for this visit.  General: Well developed, nourished, in no acute distress, alert and oriented x3   Dermatology: Skin is warm, dry and supple bilateral.  Right hallux nail appears to be  severely incurvated with hyperkeratosis formation at the distal aspects of  the lateral nail border. (+) Erythema. (+)  Edema. (-) serosanguous  drainage present. The remaining nails appear unremarkable at this time. There are no open sores, lesions or other signs of infection present.  Vascular: Dorsalis Pedis artery and Posterior Tibial artery pedal pulses are 2/4 bilateral with immedate capillary fill time. Pedal hair growth present. No lower extremity edema.   Neruologic: Grossly intact via light touch bilateral.  Musculoskeletal: Tenderness to palpation of the right hallux lateral nail fold. Muscular strength within normal limits in all groups bilateral.   Assesement and Plan: Problem List Items Addressed This Visit    None    Visit Diagnoses    Ingrown nail    -  Primary   Toe pain, right          -Discussed treatment alternatives and plan of care; Explained permanent/temporary nail avulsion and post procedure course to patient. Patient elects for right hallux lateral PNA - After a verbal and written consent, injected 3 ml of a 50:50 mixture of 2% plain  lidocaine and 0.5% plain marcaine in a normal hallux block fashion. Next, a  betadine prep was performed. Anesthesia was tested and found to be appropriate.  The offending right hallux lateral nail border was then incised from the hyponychium to the epinychium. The offending nail border was removed and cleared from the field. The area was curretted for any remaining nail or spicules. Phenol application performed and the area was then flushed with alcohol and dressed with antibiotic cream and a dry sterile dressing. -Patient was instructed to leave the dressing intact for today and begin soaking  in a weak solution of  betadine or Epsom salt and water tomorrow. Patient was instructed to  soak for 15-20 minutes each day and apply neosporin/corticosporin and a gauze or bandaid dressing each day. -Patient was instructed to monitor the toe for signs of infection and return to office if toe becomes red, hot or swollen. -Advised ice, elevation, and tylenol  or motrin if needed for pain.  -Patient is to return in 2 weeks for follow up care/nail check or sooner if problems arise.  Landis Martins, DPM

## 2019-07-08 NOTE — Patient Instructions (Signed)

## 2019-07-09 ENCOUNTER — Telehealth: Payer: Self-pay | Admitting: *Deleted

## 2019-07-09 NOTE — Telephone Encounter (Signed)
I informed pt the drops were to be used on the toenail procedure site, we had found the antibiotic and cortisone worked well due to the antibiotic for infection and cortisone for the inflammation.

## 2019-07-09 NOTE — Telephone Encounter (Signed)
Pt states she was given a prescription after her ingrown toenail procedure and it says to be used in ears only.

## 2019-07-22 ENCOUNTER — Encounter: Payer: Self-pay | Admitting: Sports Medicine

## 2019-07-22 ENCOUNTER — Other Ambulatory Visit: Payer: Self-pay

## 2019-07-22 ENCOUNTER — Ambulatory Visit: Payer: 59 | Admitting: Sports Medicine

## 2019-07-22 DIAGNOSIS — M79674 Pain in right toe(s): Secondary | ICD-10-CM

## 2019-07-22 DIAGNOSIS — Z9889 Other specified postprocedural states: Secondary | ICD-10-CM

## 2019-07-22 NOTE — Progress Notes (Signed)
Subjective: Tasha Hansen is a 33 y.o. female patient returns to office today for follow up evaluation after having Right lateral permanent nail avulsion performed on 07/08/19. Patient has been soaking using epsom salt and applying topical antibiotic drops covered with bandaid daily. Patient denies fever/chills/nausea/vomitting/any other related constitutional symptoms at this time.  Patient Active Problem List   Diagnosis Date Noted  . Sepsis ([place]) 08/27/2018  . SVD (spontaneous vaginal delivery) 04/16/2018  . Postpartum care following vaginal delivery (8/12) 04/16/2018  . Second-degree perineal laceration, with delivery 04/16/2018  . Encounter for planned induction of labor 04/15/2018  . Term pregnancy 04/15/2018  . Prolonged latent phase of labor 04/05/2018  . Meningitis 11/29/2011    Current Outpatient Medications on File Prior to Visit  Medication Sig Dispense Refill  . acetaminophen (TYLENOL) 325 MG tablet Take 2 tablets (650 mg total) by mouth every 6 (six) hours as needed for mild pain, fever or headache (or Fever >/= 101). 15 tablet 0  . cetirizine (ZYRTEC) 10 MG tablet Take 10 mg by mouth daily.    . Multiple Vitamins-Minerals (MULTIVITAMIN PO) Take by mouth.    . neomycin-polymyxin-hydrocortisone (CORTISPORIN) OTIC solution Apply 1-2 drops to toe after soaking twice a day 10 mL 0  . norethindrone-ethinyl estradiol (LOESTRIN 1/20, 21,) 1-20 MG-MCG tablet Take 1 tablet by mouth daily.     No current facility-administered medications on file prior to visit.     Allergies  Allergen Reactions  . Diphenhydramine Hcl Itching  . Oseltamivir Phosphate Itching    Objective:  General: Well developed, nourished, in no acute distress, alert and oriented x3   Dermatology: Skin is warm, dry and supple bilateral. R hallux lateral nail bed appears to be clean, dry, with mild granular tissue and surrounding eschar/scab. (-) Erythema. (-) Edema. (-) serosanguous drainage present. The  remaining nails appear unremarkable at this time. There are no other lesions or other signs of infection  present.  Neurovascular status: Intact. No lower extremity swelling; No pain with calf compression bilateral.  Musculoskeletal: Decreased tenderness to palpation of the R lateral hallux nail fold. Muscular strength within normal limits bilateral.   Assesement and Plan: Problem List Items Addressed This Visit    None    Visit Diagnoses    S/P nail surgery    -  Primary   Toe pain, right         -Examined patient  -Cleansed right hallux lateral nail fold and applied bandaid.  -Discussed plan of care with patient. -Patient may discontinue soaking  -Educated patient on long term care after nail surgery. -Patient was instructed to monitor the toe for reoccurrence and signs of infection; Patient advised to return to office or go to ER if toe becomes red, hot or swollen. -Patient is to return as needed or sooner if problems arise.  Landis Martins, DPM

## 2019-09-22 DIAGNOSIS — Z319 Encounter for procreative management, unspecified: Secondary | ICD-10-CM | POA: Diagnosis not present

## 2019-09-30 DIAGNOSIS — Z3200 Encounter for pregnancy test, result unknown: Secondary | ICD-10-CM | POA: Diagnosis not present

## 2019-09-30 DIAGNOSIS — Z1159 Encounter for screening for other viral diseases: Secondary | ICD-10-CM | POA: Diagnosis not present

## 2019-09-30 DIAGNOSIS — Z114 Encounter for screening for human immunodeficiency virus [HIV]: Secondary | ICD-10-CM | POA: Diagnosis not present

## 2019-09-30 DIAGNOSIS — R300 Dysuria: Secondary | ICD-10-CM | POA: Diagnosis not present

## 2019-09-30 DIAGNOSIS — Z118 Encounter for screening for other infectious and parasitic diseases: Secondary | ICD-10-CM | POA: Diagnosis not present

## 2019-09-30 DIAGNOSIS — Z113 Encounter for screening for infections with a predominantly sexual mode of transmission: Secondary | ICD-10-CM | POA: Diagnosis not present

## 2019-09-30 DIAGNOSIS — R109 Unspecified abdominal pain: Secondary | ICD-10-CM | POA: Diagnosis not present

## 2019-11-07 DIAGNOSIS — Z3200 Encounter for pregnancy test, result unknown: Secondary | ICD-10-CM | POA: Diagnosis not present

## 2019-11-07 DIAGNOSIS — Z3689 Encounter for other specified antenatal screening: Secondary | ICD-10-CM | POA: Diagnosis not present

## 2019-11-27 DIAGNOSIS — Z3201 Encounter for pregnancy test, result positive: Secondary | ICD-10-CM | POA: Diagnosis not present

## 2019-12-22 DIAGNOSIS — Z3A10 10 weeks gestation of pregnancy: Secondary | ICD-10-CM | POA: Diagnosis not present

## 2019-12-22 DIAGNOSIS — O209 Hemorrhage in early pregnancy, unspecified: Secondary | ICD-10-CM | POA: Diagnosis not present

## 2019-12-25 DIAGNOSIS — Z113 Encounter for screening for infections with a predominantly sexual mode of transmission: Secondary | ICD-10-CM | POA: Diagnosis not present

## 2019-12-25 DIAGNOSIS — Z3481 Encounter for supervision of other normal pregnancy, first trimester: Secondary | ICD-10-CM | POA: Diagnosis not present

## 2019-12-25 DIAGNOSIS — Z3689 Encounter for other specified antenatal screening: Secondary | ICD-10-CM | POA: Diagnosis not present

## 2019-12-25 LAB — OB RESULTS CONSOLE GC/CHLAMYDIA
Chlamydia: NEGATIVE
Gonorrhea: NEGATIVE

## 2019-12-25 LAB — OB RESULTS CONSOLE ABO/RH: RH Type: POSITIVE

## 2019-12-25 LAB — OB RESULTS CONSOLE ANTIBODY SCREEN: Antibody Screen: NEGATIVE

## 2019-12-25 LAB — OB RESULTS CONSOLE HEPATITIS B SURFACE ANTIGEN: Hepatitis B Surface Ag: NEGATIVE

## 2019-12-25 LAB — OB RESULTS CONSOLE RPR: RPR: NONREACTIVE

## 2019-12-25 LAB — OB RESULTS CONSOLE RUBELLA ANTIBODY, IGM: Rubella: IMMUNE

## 2019-12-25 LAB — OB RESULTS CONSOLE HIV ANTIBODY (ROUTINE TESTING): HIV: NONREACTIVE

## 2020-02-26 DIAGNOSIS — Z363 Encounter for antenatal screening for malformations: Secondary | ICD-10-CM | POA: Diagnosis not present

## 2020-02-26 DIAGNOSIS — Z361 Encounter for antenatal screening for raised alphafetoprotein level: Secondary | ICD-10-CM | POA: Diagnosis not present

## 2020-03-11 DIAGNOSIS — Z362 Encounter for other antenatal screening follow-up: Secondary | ICD-10-CM | POA: Diagnosis not present

## 2020-04-09 DIAGNOSIS — Z3689 Encounter for other specified antenatal screening: Secondary | ICD-10-CM | POA: Diagnosis not present

## 2020-04-09 DIAGNOSIS — Z3482 Encounter for supervision of other normal pregnancy, second trimester: Secondary | ICD-10-CM | POA: Diagnosis not present

## 2020-05-05 DIAGNOSIS — Z23 Encounter for immunization: Secondary | ICD-10-CM | POA: Diagnosis not present

## 2020-05-31 DIAGNOSIS — O99019 Anemia complicating pregnancy, unspecified trimester: Secondary | ICD-10-CM | POA: Diagnosis not present

## 2020-05-31 DIAGNOSIS — Z3A33 33 weeks gestation of pregnancy: Secondary | ICD-10-CM | POA: Diagnosis not present

## 2020-06-01 ENCOUNTER — Other Ambulatory Visit: Payer: Self-pay | Admitting: Obstetrics

## 2020-06-01 DIAGNOSIS — O99013 Anemia complicating pregnancy, third trimester: Secondary | ICD-10-CM

## 2020-06-04 ENCOUNTER — Other Ambulatory Visit: Payer: Self-pay

## 2020-06-04 ENCOUNTER — Encounter (HOSPITAL_COMMUNITY)
Admission: RE | Admit: 2020-06-04 | Discharge: 2020-06-04 | Disposition: A | Discharge status: Home / Self Care | Payer: 59 | Source: Ambulatory Visit | Attending: Obstetrics | Admitting: Obstetrics

## 2020-06-04 DIAGNOSIS — O99013 Anemia complicating pregnancy, third trimester: Secondary | ICD-10-CM | POA: Insufficient documentation

## 2020-06-04 MED ORDER — SODIUM CHLORIDE 0.9 % IV SOLN
510.0000 mg | INTRAVENOUS | Status: DC
  Administered 2020-06-04: 510 mg via INTRAVENOUS
  Filled 2020-06-04: qty 17

## 2020-06-04 NOTE — Discharge Instructions (Signed)

## 2020-06-10 ENCOUNTER — Other Ambulatory Visit (HOSPITAL_COMMUNITY): Payer: Self-pay | Admitting: Obstetrics

## 2020-06-11 ENCOUNTER — Encounter (HOSPITAL_COMMUNITY)
Admission: RE | Admit: 2020-06-11 | Discharge: 2020-06-11 | Disposition: A | Discharge status: Home / Self Care | Payer: 59 | Source: Ambulatory Visit | Attending: Obstetrics | Admitting: Obstetrics

## 2020-06-11 ENCOUNTER — Other Ambulatory Visit: Payer: Self-pay

## 2020-06-11 DIAGNOSIS — O99013 Anemia complicating pregnancy, third trimester: Secondary | ICD-10-CM | POA: Diagnosis not present

## 2020-06-11 MED ORDER — SODIUM CHLORIDE 0.9 % IV SOLN
510.0000 mg | Freq: Once | INTRAVENOUS | Status: AC
  Administered 2020-06-11: 510 mg via INTRAVENOUS
  Filled 2020-06-11: qty 17

## 2020-06-14 DIAGNOSIS — Z3A35 35 weeks gestation of pregnancy: Secondary | ICD-10-CM | POA: Diagnosis not present

## 2020-06-14 DIAGNOSIS — O99013 Anemia complicating pregnancy, third trimester: Secondary | ICD-10-CM | POA: Diagnosis not present

## 2020-06-14 DIAGNOSIS — Z3685 Encounter for antenatal screening for Streptococcus B: Secondary | ICD-10-CM | POA: Diagnosis not present

## 2020-06-14 DIAGNOSIS — O321XX0 Maternal care for breech presentation, not applicable or unspecified: Secondary | ICD-10-CM | POA: Diagnosis not present

## 2020-06-16 ENCOUNTER — Telehealth (HOSPITAL_COMMUNITY): Payer: Self-pay | Admitting: *Deleted

## 2020-06-16 ENCOUNTER — Encounter (HOSPITAL_COMMUNITY): Payer: Self-pay | Admitting: *Deleted

## 2020-06-16 NOTE — Telephone Encounter (Signed)
Preadmission screen  

## 2020-06-18 DIAGNOSIS — Z3A36 36 weeks gestation of pregnancy: Secondary | ICD-10-CM | POA: Diagnosis not present

## 2020-06-18 DIAGNOSIS — O321XX0 Maternal care for breech presentation, not applicable or unspecified: Secondary | ICD-10-CM | POA: Diagnosis not present

## 2020-06-19 ENCOUNTER — Other Ambulatory Visit (HOSPITAL_COMMUNITY)
Admission: RE | Admit: 2020-06-19 | Discharge: 2020-06-19 | Disposition: A | Discharge status: Home / Self Care | Payer: 59 | Source: Ambulatory Visit | Attending: Obstetrics | Admitting: Obstetrics

## 2020-06-19 DIAGNOSIS — K219 Gastro-esophageal reflux disease without esophagitis: Secondary | ICD-10-CM | POA: Diagnosis not present

## 2020-06-19 DIAGNOSIS — Z20822 Contact with and (suspected) exposure to covid-19: Secondary | ICD-10-CM | POA: Insufficient documentation

## 2020-06-19 DIAGNOSIS — O99613 Diseases of the digestive system complicating pregnancy, third trimester: Secondary | ICD-10-CM | POA: Diagnosis not present

## 2020-06-19 DIAGNOSIS — Z3A00 Weeks of gestation of pregnancy not specified: Secondary | ICD-10-CM | POA: Diagnosis not present

## 2020-06-19 DIAGNOSIS — Z01812 Encounter for preprocedural laboratory examination: Secondary | ICD-10-CM | POA: Insufficient documentation

## 2020-06-19 DIAGNOSIS — O99013 Anemia complicating pregnancy, third trimester: Secondary | ICD-10-CM | POA: Diagnosis not present

## 2020-06-19 DIAGNOSIS — D649 Anemia, unspecified: Secondary | ICD-10-CM | POA: Diagnosis not present

## 2020-06-19 DIAGNOSIS — O321XX0 Maternal care for breech presentation, not applicable or unspecified: Secondary | ICD-10-CM | POA: Diagnosis not present

## 2020-06-19 LAB — SARS CORONAVIRUS 2 (TAT 6-24 HRS): SARS Coronavirus 2: NEGATIVE

## 2020-06-20 NOTE — H&P (Signed)
TAYRA DAWE is a 34 y.o. G2P1001 at presenting for external cephalic version. Pt notes no contractions. Good fetal movement, No vaginal bleeding, not leaking fluid.  PNCare at [facility] since 6 wks - Dated by LMP c/w 6 wk u/s - Anemia with low ferritin, on oral iron - GBS neg - GERD, Protonix   Prenatal Transfer Tool  Maternal Diabetes: No Genetic Screening: Normal Maternal Ultrasounds/Referrals: Normal Fetal Ultrasounds or other Referrals:  None Maternal Substance Abuse:  No Significant Maternal Medications:  None Significant Maternal Lab Results: Group B Strep negative     OB History    Gravida  2   Para  1   Term  1   Preterm      AB      Living  1     SAB      TAB      Ectopic      Multiple  0   Live Births  1          Past Medical History:  Diagnosis Date  . Allergy   . Anemia   . GERD (gastroesophageal reflux disease)   . Headache    "monthly" (08/27/2018)  . Meningitis 2013  . Migraine    "a few times/year" (08/27/2018)   Past Surgical History:  Procedure Laterality Date  . [place]   as an infant  . WISDOM TOOTH EXTRACTION  age 39   Family History: family history includes Diabetes in her maternal grandfather, maternal grandmother, paternal grandfather, and paternal grandmother; Hypertension in her father, maternal grandfather, maternal grandmother, paternal grandfather, and paternal grandmother. Social History:  reports that she has never smoked. She has never used smokeless tobacco. She reports that she does not drink alcohol and does not use drugs.  Review of Systems - Negative except discomfort of pregnancy     Prenatal labs: ABO, Rh: B/Positive/-- (04/22 0000) Antibody: Negative (04/22 0000) Rubella: Immune (04/22 0000) RPR: Nonreactive (04/22 0000)  HBsAg: Negative (04/22 0000)  HIV: Non-reactive (04/22 0000)  GBS:   neg 1 hr Glucola 70  Genetic screening nl Panorama, nl AFP Anatomy US  normal   Assessment/Plan: 34 y.o. G2P1001 at  - complete breech. R/B PCS vs attempted version, Smaller baby, adequate AFI, prior SVD and complete breech presentation as favorable factors. Maternal BMI and ant placenta as barriers to success. Risks of ROM, labor, abruption and need for delivery early or emergency c/s d/w pt.    [name] 06/20/2020, 8:30 PM

## 2020-06-21 ENCOUNTER — Other Ambulatory Visit (HOSPITAL_COMMUNITY)
Admission: RE | Admit: 2020-06-21 | Discharge: 2020-06-21 | DRG: 833 | Disposition: A | Discharge status: Home / Self Care | Payer: 59 | Attending: Family Medicine | Admitting: Family Medicine

## 2020-06-21 ENCOUNTER — Ambulatory Visit (HOSPITAL_COMMUNITY)
Admission: AD | Admit: 2020-06-21 | Discharge: 2020-06-21 | Disposition: A | Discharge status: Home / Self Care | Payer: 59 | Attending: Obstetrics | Admitting: Obstetrics

## 2020-06-21 ENCOUNTER — Other Ambulatory Visit: Payer: Self-pay

## 2020-06-21 ENCOUNTER — Encounter (HOSPITAL_COMMUNITY): Payer: Self-pay

## 2020-06-21 DIAGNOSIS — O99613 Diseases of the digestive system complicating pregnancy, third trimester: Secondary | ICD-10-CM | POA: Diagnosis not present

## 2020-06-21 DIAGNOSIS — D649 Anemia, unspecified: Secondary | ICD-10-CM | POA: Insufficient documentation

## 2020-06-21 DIAGNOSIS — O99013 Anemia complicating pregnancy, third trimester: Secondary | ICD-10-CM | POA: Diagnosis present

## 2020-06-21 DIAGNOSIS — O321XX0 Maternal care for breech presentation, not applicable or unspecified: Secondary | ICD-10-CM | POA: Diagnosis present

## 2020-06-21 DIAGNOSIS — K219 Gastro-esophageal reflux disease without esophagitis: Secondary | ICD-10-CM | POA: Insufficient documentation

## 2020-06-21 DIAGNOSIS — Z20822 Contact with and (suspected) exposure to covid-19: Secondary | ICD-10-CM | POA: Diagnosis present

## 2020-06-21 DIAGNOSIS — Z3A36 36 weeks gestation of pregnancy: Secondary | ICD-10-CM | POA: Insufficient documentation

## 2020-06-21 DIAGNOSIS — Z3A00 Weeks of gestation of pregnancy not specified: Secondary | ICD-10-CM

## 2020-06-21 LAB — CBC
HCT: 35.3 % — ABNORMAL LOW (ref 36.0–46.0)
Hemoglobin: 11 g/dL — ABNORMAL LOW (ref 12.0–15.0)
MCH: 28.3 pg (ref 26.0–34.0)
MCHC: 31.2 g/dL (ref 30.0–36.0)
MCV: 90.7 fL (ref 80.0–100.0)
Platelets: 293 10*3/uL (ref 150–400)
RBC: 3.89 MIL/uL (ref 3.87–5.11)
RDW: 15.6 % — ABNORMAL HIGH (ref 11.5–15.5)
WBC: 6.2 10*3/uL (ref 4.0–10.5)
nRBC: 0 % (ref 0.0–0.2)

## 2020-06-21 LAB — TYPE AND SCREEN
ABO/RH(D): B POS
Antibody Screen: NEGATIVE

## 2020-06-21 MED ORDER — TERBUTALINE SULFATE 1 MG/ML IJ SOLN
0.2500 mg | Freq: Once | INTRAMUSCULAR | Status: AC
  Administered 2020-06-21: 0.25 mg via SUBCUTANEOUS

## 2020-06-21 MED ORDER — LACTATED RINGERS IV SOLN: INTRAVENOUS | Status: DC

## 2020-06-21 MED ORDER — TERBUTALINE SULFATE 1 MG/ML IJ SOLN
INTRAMUSCULAR | Status: AC
  Filled 2020-06-21: qty 1

## 2020-06-21 NOTE — Progress Notes (Signed)
Attempted external cephalic version  Prior to procedure patient notes intermittent contractions every 10 to 15 minutes with good fetal movement no leakage of fluid no vaginal bleeding  Preprocedure NST: 140s, positive accelerations, no decelerations, 10 beat variability Toco contractions to 10 to 15 minutes  Ultrasound: Frank breech, anterior placenta, adequate AFI, fetal spine to maternal left, fetal vertex just to the right of midline.  Risk benefits alternatives of procedure discussed with patient who agrees to proceed on Patient given 0.25 mg IM terbutaline, IV was running.  Patient placed in the supine position.  Using 1 hand to elevate the sacrum out of the pelvis and a another hand on the fetal vertex a counterclockwise steady pressure was placed on both the vertex and the sacrum in an attempt to do a forward roll.  Pressure applied over about 10 seconds after which time patient asked to stop the procedure.  Abdominal muscle tension was tight given the patient's discomfort.  Ultrasound used to assess fetal position and fetal heart rate which was reactive though baby had not turned from the vertex position.  After short rest a second and then a third attempts were all undertaken, each time with attempted forward roll each attempts lasting about 10 seconds in each attempt stopped due to maternal request.  Between each attempts heart rate was reactive patient's discomfort resolved.  Baby never returned from the breech position  Patient was monitored for additional 30 to 40 minutes.  Patient notes no pain, intermittent contractions no different in timing or intensity from preprocedure, good fetal movement, no leakage of fluid, no vaginal bleeding.  Toco showed contractions every 10 minutes.  Heart tones 140s positive accelerations no decelerations 10 beat variability.  Primary cesarean section discussed and scheduled.  Patient was discharged home in stable condition  [name] 06/21/2020  9:45 AM

## 2020-06-21 NOTE — H&P (Signed)
Tasha Hansen is a 34 y.o. G2P1001 at 36'3 presenting for external cephalic version. Pt notes contractions intermittently. Good fetal movement, No vaginal bleeding, not leaking fluid.  PNCare at [facility] since 6 wks - Dated by LMP c/w 6 wk u/s - Anemia with low ferritin, on oral iron, s/p IV iron x 2 - GBS neg - GERD, Protonix   Prenatal Transfer Tool  Maternal Diabetes: No Genetic Screening: Normal Maternal Ultrasounds/Referrals: Normal Fetal Ultrasounds or other Referrals:  None Maternal Substance Abuse:  No Significant Maternal Medications:  None Significant Maternal Lab Results: Group B Strep negative             OB History    Gravida  2   Para  1   Term  1   Preterm      AB      Living  1     SAB      TAB      Ectopic      Multiple  0   Live Births  1              Past Medical History:  Diagnosis Date  . Allergy   . Anemia   . GERD (gastroesophageal reflux disease)   . Headache    "monthly" (08/27/2018)  . Meningitis 2013  . Migraine    "a few times/year" (08/27/2018)        Past Surgical History:  Procedure Laterality Date  . [place]   as an infant  . WISDOM TOOTH EXTRACTION  age 64   Family History: family history includes Diabetes in her maternal grandfather, maternal grandmother, paternal grandfather, and paternal grandmother; Hypertension in her father, maternal grandfather, maternal grandmother, paternal grandfather, and paternal grandmother. Social History:  reports that she has never smoked. She has never used smokeless tobacco. She reports that she does not drink alcohol and does not use drugs.  Review of Systems - Negative except discomfort of pregnancy    Physical Exam:  Vitals:   06/21/20 0723  BP: 112/74  Pulse: (!) 103  Resp: 20  Temp: 98.6 F (37 C)    Gen: well appearing, no distress Abd: gravid, NT, no RUQ pain LE: no edema, equal bilaterally,  non-tender Toco: q110min FH: baseline 140s, accelerations present, no deceleratons, 10 beat variability  Prenatal labs: ABO, Rh: B/Positive/-- (04/22 0000) Antibody: Negative (04/22 0000) Rubella: Immune (04/22 0000) RPR: Nonreactive (04/22 0000)  HBsAg: Negative (04/22 0000)  HIV: Non-reactive (04/22 0000)  GBS:   neg 1 hr Glucola 70            Genetic screening nl Panorama, nl AFP Anatomy US normal   Assessment/Plan: 34 y.o. G2P1001 at  - complete breech. R/B PCS vs attempted version, Smaller baby, adequate AFI, prior SVD and complete breech presentation as favorable factors. Maternal BMI and ant placenta as barriers to success. Risks of ROM, labor, abruption and need for delivery early or emergency c/s d/w pt.              [name] 06/20/2020, 8:30 PM  [name] 06/21/2020 8:26 AM

## 2020-06-21 NOTE — Discharge Summary (Signed)
FELIZ HERARD MRN: [id number] DOB/AGE: Oct 11, 1985 34 y.o.  Admit date: 06/21/2020 Discharge date: June 21, 2020  Admission Diagnoses: VERSION  Discharge Diagnoses: VERSION        Active Problems:   * No active hospital problems. *   Discharged Condition: stable  Hospital Course: Brought to PACU for attempted version.  Ultrasound nonstress test done.  Attempted external cephalic version with out success.  Baby remains in the breech position.  Maternal and fetal status were reassuring.  Primary cesarean section scheduled.  Consults: None  Treatments: Attempted external cephalic version  Disposition: Discharge disposition: 01-Home or Self Care        Allergies as of 06/21/2020      Reactions   Diphenhydramine Hcl Itching   Oseltamivir Phosphate Itching      Medication List    TAKE these medications   acetaminophen 325 MG tablet Commonly known as: TYLENOL Take 2 tablets (650 mg total) by mouth every 6 (six) hours as needed for mild pain, fever or headache (or Fever >/= 101).   cetirizine 10 MG tablet Commonly known as: ZYRTEC Take 10 mg by mouth daily.   Loestrin 1/20 (21) 1-20 MG-MCG tablet Generic drug: norethindrone-ethinyl estradiol Take 1 tablet by mouth daily.   MULTIVITAMIN PO Take by mouth.   neomycin-polymyxin-hydrocortisone OTIC solution Commonly known as: CORTISPORIN Apply 1-2 drops to toe after soaking twice a day        Signed: [name], MD 06/21/2020, 9:46 AM

## 2020-06-27 ENCOUNTER — Encounter (HOSPITAL_COMMUNITY)
Admission: AD | Disposition: A | Discharge status: Home / Self Care | Payer: Self-pay | Source: Home / Self Care | Attending: Obstetrics

## 2020-06-27 ENCOUNTER — Inpatient Hospital Stay (HOSPITAL_COMMUNITY): Payer: 59 | Admitting: Anesthesiology

## 2020-06-27 ENCOUNTER — Encounter (HOSPITAL_COMMUNITY): Payer: Self-pay | Admitting: Obstetrics

## 2020-06-27 ENCOUNTER — Inpatient Hospital Stay (HOSPITAL_COMMUNITY)
Admission: AD | Admit: 2020-06-27 | Discharge: 2020-06-30 | DRG: 787 | Disposition: A | Discharge status: Home / Self Care | Payer: 59 | Attending: Obstetrics | Admitting: Obstetrics

## 2020-06-27 ENCOUNTER — Other Ambulatory Visit: Payer: Self-pay

## 2020-06-27 DIAGNOSIS — D62 Acute posthemorrhagic anemia: Secondary | ICD-10-CM

## 2020-06-27 DIAGNOSIS — O99214 Obesity complicating childbirth: Secondary | ICD-10-CM | POA: Diagnosis present

## 2020-06-27 DIAGNOSIS — Z3A37 37 weeks gestation of pregnancy: Secondary | ICD-10-CM

## 2020-06-27 DIAGNOSIS — O9903 Anemia complicating the puerperium: Secondary | ICD-10-CM | POA: Diagnosis not present

## 2020-06-27 DIAGNOSIS — Z23 Encounter for immunization: Secondary | ICD-10-CM

## 2020-06-27 DIAGNOSIS — N83292 Other ovarian cyst, left side: Secondary | ICD-10-CM | POA: Diagnosis present

## 2020-06-27 DIAGNOSIS — K219 Gastro-esophageal reflux disease without esophagitis: Secondary | ICD-10-CM | POA: Diagnosis present

## 2020-06-27 DIAGNOSIS — Z20822 Contact with and (suspected) exposure to covid-19: Secondary | ICD-10-CM | POA: Diagnosis present

## 2020-06-27 DIAGNOSIS — O321XX0 Maternal care for breech presentation, not applicable or unspecified: Secondary | ICD-10-CM | POA: Diagnosis not present

## 2020-06-27 DIAGNOSIS — O9962 Diseases of the digestive system complicating childbirth: Secondary | ICD-10-CM | POA: Diagnosis present

## 2020-06-27 DIAGNOSIS — O3483 Maternal care for other abnormalities of pelvic organs, third trimester: Secondary | ICD-10-CM | POA: Diagnosis present

## 2020-06-27 DIAGNOSIS — O9902 Anemia complicating childbirth: Secondary | ICD-10-CM | POA: Diagnosis not present

## 2020-06-27 DIAGNOSIS — O26893 Other specified pregnancy related conditions, third trimester: Secondary | ICD-10-CM | POA: Diagnosis present

## 2020-06-27 DIAGNOSIS — N83291 Other ovarian cyst, right side: Secondary | ICD-10-CM | POA: Diagnosis present

## 2020-06-27 DIAGNOSIS — Z98891 History of uterine scar from previous surgery: Secondary | ICD-10-CM

## 2020-06-27 LAB — RESPIRATORY PANEL BY RT PCR (FLU A&B, COVID)
Influenza A by PCR: NEGATIVE
Influenza B by PCR: NEGATIVE
SARS Coronavirus 2 by RT PCR: NEGATIVE

## 2020-06-27 LAB — RPR: RPR Ser Ql: NONREACTIVE

## 2020-06-27 LAB — CBC
HCT: 36.1 % (ref 36.0–46.0)
Hemoglobin: 11.5 g/dL — ABNORMAL LOW (ref 12.0–15.0)
MCH: 28.5 pg (ref 26.0–34.0)
MCHC: 31.9 g/dL (ref 30.0–36.0)
MCV: 89.6 fL (ref 80.0–100.0)
Platelets: 270 10*3/uL (ref 150–400)
RBC: 4.03 MIL/uL (ref 3.87–5.11)
RDW: 15.5 % (ref 11.5–15.5)
WBC: 7.6 10*3/uL (ref 4.0–10.5)
nRBC: 0 % (ref 0.0–0.2)

## 2020-06-27 LAB — TYPE AND SCREEN
ABO/RH(D): B POS
Antibody Screen: NEGATIVE

## 2020-06-27 SURGERY — Surgical Case
Anesthesia: Spinal | Wound class: Clean Contaminated

## 2020-06-27 MED ORDER — DIPHENHYDRAMINE HCL 50 MG/ML IJ SOLN: 12.5000 mg | INTRAMUSCULAR | Status: DC | PRN

## 2020-06-27 MED ORDER — LACTATED RINGERS IV BOLUS: 1000.0000 mL | Freq: Once | INTRAVENOUS | Status: DC

## 2020-06-27 MED ORDER — ONDANSETRON HCL 4 MG/2ML IJ SOLN
4.0000 mg | Freq: Once | INTRAMUSCULAR | Status: AC
  Administered 2020-06-27: 4 mg via INTRAVENOUS
  Filled 2020-06-27: qty 2

## 2020-06-27 MED ORDER — SCOPOLAMINE 1 MG/3DAYS TD PT72
1.0000 | MEDICATED_PATCH | Freq: Once | TRANSDERMAL | Status: DC
  Filled 2020-06-27: qty 1

## 2020-06-27 MED ORDER — NALBUPHINE HCL 10 MG/ML IJ SOLN: 5.0000 mg | INTRAMUSCULAR | Status: DC | PRN

## 2020-06-27 MED ORDER — ONDANSETRON HCL 4 MG/2ML IJ SOLN: 4.0000 mg | Freq: Three times a day (TID) | INTRAMUSCULAR | Status: DC | PRN

## 2020-06-27 MED ORDER — NALBUPHINE HCL 10 MG/ML IJ SOLN: 5.0000 mg | Freq: Once | INTRAMUSCULAR | Status: DC | PRN

## 2020-06-27 MED ORDER — TETANUS-DIPHTH-ACELL PERTUSSIS 5-2.5-18.5 LF-MCG/0.5 IM SUSP: 0.5000 mL | Freq: Once | INTRAMUSCULAR | Status: DC

## 2020-06-27 MED ORDER — ONDANSETRON HCL 4 MG/2ML IJ SOLN
INTRAMUSCULAR | Status: DC | PRN
  Administered 2020-06-27: 4 mg via INTRAVENOUS

## 2020-06-27 MED ORDER — OXYCODONE HCL 5 MG PO TABS: 5.0000 mg | ORAL_TABLET | Freq: Once | ORAL | Status: DC | PRN

## 2020-06-27 MED ORDER — KETOROLAC TROMETHAMINE 30 MG/ML IJ SOLN
INTRAMUSCULAR | Status: AC
  Filled 2020-06-27: qty 1

## 2020-06-27 MED ORDER — HYDROMORPHONE HCL 1 MG/ML IJ SOLN: 0.2500 mg | INTRAMUSCULAR | Status: DC | PRN

## 2020-06-27 MED ORDER — KETOROLAC TROMETHAMINE 30 MG/ML IJ SOLN: 30.0000 mg | Freq: Once | INTRAMUSCULAR | Status: DC | PRN

## 2020-06-27 MED ORDER — COCONUT OIL OIL: 1.0000 "application " | TOPICAL_OIL | Status: DC | PRN

## 2020-06-27 MED ORDER — DIPHENHYDRAMINE HCL 25 MG PO CAPS: 25.0000 mg | ORAL_CAPSULE | ORAL | Status: DC | PRN

## 2020-06-27 MED ORDER — SIMETHICONE 80 MG PO CHEW
80.0000 mg | CHEWABLE_TABLET | Freq: Three times a day (TID) | ORAL | Status: DC
  Administered 2020-06-27 – 2020-06-30 (×12): 80 mg via ORAL
  Filled 2020-06-27 (×13): qty 1

## 2020-06-27 MED ORDER — FENTANYL CITRATE (PF) 100 MCG/2ML IJ SOLN
INTRAMUSCULAR | Status: DC | PRN
  Administered 2020-06-27: 15 ug via INTRATHECAL

## 2020-06-27 MED ORDER — PHENYLEPHRINE HCL (PRESSORS) 10 MG/ML IV SOLN
INTRAVENOUS | Status: DC | PRN
  Administered 2020-06-27 (×3): 80 ug via INTRAVENOUS

## 2020-06-27 MED ORDER — MORPHINE SULFATE (PF) 0.5 MG/ML IJ SOLN
INTRAMUSCULAR | Status: DC | PRN
  Administered 2020-06-27: .15 mg via INTRATHECAL

## 2020-06-27 MED ORDER — MEPERIDINE HCL 25 MG/ML IJ SOLN: 6.2500 mg | INTRAMUSCULAR | Status: DC | PRN

## 2020-06-27 MED ORDER — IBUPROFEN 800 MG PO TABS
800.0000 mg | ORAL_TABLET | Freq: Three times a day (TID) | ORAL | Status: AC
  Administered 2020-06-27 – 2020-06-29 (×7): 800 mg via ORAL
  Filled 2020-06-27 (×7): qty 1

## 2020-06-27 MED ORDER — DIPHENHYDRAMINE HCL 25 MG PO CAPS: 25.0000 mg | ORAL_CAPSULE | Freq: Four times a day (QID) | ORAL | Status: DC | PRN

## 2020-06-27 MED ORDER — KETOROLAC TROMETHAMINE 30 MG/ML IJ SOLN
30.0000 mg | Freq: Four times a day (QID) | INTRAMUSCULAR | Status: AC | PRN
  Administered 2020-06-27: 30 mg via INTRAMUSCULAR

## 2020-06-27 MED ORDER — OXYTOCIN-SODIUM CHLORIDE 30-0.9 UT/500ML-% IV SOLN
2.5000 [IU]/h | INTRAVENOUS | Status: AC
  Administered 2020-06-27: 2.5 [IU]/h via INTRAVENOUS
  Filled 2020-06-27: qty 500

## 2020-06-27 MED ORDER — PROMETHAZINE HCL 25 MG/ML IJ SOLN: 6.2500 mg | INTRAMUSCULAR | Status: DC | PRN

## 2020-06-27 MED ORDER — SODIUM CHLORIDE 0.9 % IV SOLN: INTRAVENOUS | Status: DC | PRN

## 2020-06-27 MED ORDER — KETOROLAC TROMETHAMINE 30 MG/ML IJ SOLN: 30.0000 mg | Freq: Four times a day (QID) | INTRAMUSCULAR | Status: AC | PRN

## 2020-06-27 MED ORDER — LACTATED RINGERS IV SOLN: INTRAVENOUS | Status: DC

## 2020-06-27 MED ORDER — FAMOTIDINE IN NACL 20-0.9 MG/50ML-% IV SOLN
20.0000 mg | Freq: Once | INTRAVENOUS | Status: AC
  Administered 2020-06-27: 20 mg via INTRAVENOUS
  Filled 2020-06-27: qty 50

## 2020-06-27 MED ORDER — FENTANYL CITRATE (PF) 100 MCG/2ML IJ SOLN
INTRAMUSCULAR | Status: DC | PRN
Start: 2020-06-27 — End: 2020-06-27
  Administered 2020-06-27: 85 ug via INTRAVENOUS

## 2020-06-27 MED ORDER — PHENYLEPHRINE 40 MCG/ML (10ML) SYRINGE FOR IV PUSH (FOR BLOOD PRESSURE SUPPORT)
PREFILLED_SYRINGE | INTRAVENOUS | Status: AC
  Filled 2020-06-27: qty 10

## 2020-06-27 MED ORDER — OXYCODONE HCL 5 MG PO TABS
5.0000 mg | ORAL_TABLET | ORAL | Status: DC | PRN
  Administered 2020-06-27: 5 mg via ORAL
  Administered 2020-06-28 – 2020-06-30 (×6): 10 mg via ORAL
  Administered 2020-06-30: 5 mg via ORAL
  Administered 2020-06-30: 10 mg via ORAL
  Filled 2020-06-27: qty 2
  Filled 2020-06-27: qty 1
  Filled 2020-06-27 (×2): qty 2
  Filled 2020-06-27: qty 1
  Filled 2020-06-27 (×4): qty 2

## 2020-06-27 MED ORDER — OXYTOCIN-SODIUM CHLORIDE 30-0.9 UT/500ML-% IV SOLN
INTRAVENOUS | Status: DC | PRN
  Administered 2020-06-27: 30 [IU] via INTRAVENOUS

## 2020-06-27 MED ORDER — MENTHOL 3 MG MT LOZG: 1.0000 | LOZENGE | OROMUCOSAL | Status: DC | PRN

## 2020-06-27 MED ORDER — ZOLPIDEM TARTRATE 5 MG PO TABS: 5.0000 mg | ORAL_TABLET | Freq: Every evening | ORAL | Status: DC | PRN

## 2020-06-27 MED ORDER — NALOXONE HCL 4 MG/10ML IJ SOLN
1.0000 ug/kg/h | INTRAVENOUS | Status: DC | PRN
  Filled 2020-06-27: qty 5

## 2020-06-27 MED ORDER — FENTANYL CITRATE (PF) 100 MCG/2ML IJ SOLN
INTRAMUSCULAR | Status: AC
  Filled 2020-06-27: qty 2

## 2020-06-27 MED ORDER — BUPIVACAINE IN DEXTROSE 0.75-8.25 % IT SOLN
INTRATHECAL | Status: DC | PRN
  Administered 2020-06-27: 1.6 mL via INTRATHECAL

## 2020-06-27 MED ORDER — PRENATAL MULTIVITAMIN CH
1.0000 | ORAL_TABLET | Freq: Every day | ORAL | Status: DC
  Administered 2020-06-27 – 2020-06-30 (×4): 1 via ORAL
  Filled 2020-06-27 (×4): qty 1

## 2020-06-27 MED ORDER — OXYCODONE HCL 5 MG/5ML PO SOLN: 5.0000 mg | Freq: Once | ORAL | Status: DC | PRN

## 2020-06-27 MED ORDER — SIMETHICONE 80 MG PO CHEW: 80.0000 mg | CHEWABLE_TABLET | ORAL | Status: DC | PRN

## 2020-06-27 MED ORDER — SODIUM CHLORIDE 0.9 % IR SOLN
Status: DC | PRN
  Administered 2020-06-27: 1000 mL

## 2020-06-27 MED ORDER — ACETAMINOPHEN 500 MG PO TABS
1000.0000 mg | ORAL_TABLET | Freq: Four times a day (QID) | ORAL | Status: AC
  Administered 2020-06-27 – 2020-06-28 (×4): 1000 mg via ORAL
  Filled 2020-06-27 (×4): qty 2

## 2020-06-27 MED ORDER — SENNOSIDES-DOCUSATE SODIUM 8.6-50 MG PO TABS
2.0000 | ORAL_TABLET | ORAL | Status: DC
  Administered 2020-06-27 – 2020-06-29 (×3): 2 via ORAL
  Filled 2020-06-27 (×4): qty 2

## 2020-06-27 MED ORDER — ONDANSETRON HCL 4 MG/2ML IJ SOLN
INTRAMUSCULAR | Status: AC
  Filled 2020-06-27: qty 2

## 2020-06-27 MED ORDER — SIMETHICONE 80 MG PO CHEW
80.0000 mg | CHEWABLE_TABLET | ORAL | Status: DC
  Administered 2020-06-28 – 2020-06-29 (×2): 80 mg via ORAL
  Filled 2020-06-27 (×4): qty 1

## 2020-06-27 MED ORDER — SODIUM CHLORIDE 0.9% FLUSH
3.0000 mL | INTRAVENOUS | Status: DC | PRN
  Administered 2020-06-28: 3 mL via INTRAVENOUS

## 2020-06-27 MED ORDER — WITCH HAZEL-GLYCERIN EX PADS: 1.0000 "application " | MEDICATED_PAD | CUTANEOUS | Status: DC | PRN

## 2020-06-27 MED ORDER — SOD CITRATE-CITRIC ACID 500-334 MG/5ML PO SOLN
30.0000 mL | Freq: Once | ORAL | Status: AC
  Administered 2020-06-27: 30 mL via ORAL
  Filled 2020-06-27: qty 15

## 2020-06-27 MED ORDER — CEFAZOLIN SODIUM-DEXTROSE 2-4 GM/100ML-% IV SOLN
2.0000 g | INTRAVENOUS | Status: AC
  Administered 2020-06-27: 2 g via INTRAVENOUS
  Filled 2020-06-27: qty 100

## 2020-06-27 MED ORDER — NALOXONE HCL 0.4 MG/ML IJ SOLN: 0.4000 mg | INTRAMUSCULAR | Status: DC | PRN

## 2020-06-27 MED ORDER — OXYTOCIN-SODIUM CHLORIDE 30-0.9 UT/500ML-% IV SOLN
INTRAVENOUS | Status: AC
  Filled 2020-06-27: qty 500

## 2020-06-27 MED ORDER — DIBUCAINE (PERIANAL) 1 % EX OINT: 1.0000 "application " | TOPICAL_OINTMENT | CUTANEOUS | Status: DC | PRN

## 2020-06-27 MED ORDER — PHENYLEPHRINE HCL-NACL 20-0.9 MG/250ML-% IV SOLN
INTRAVENOUS | Status: DC | PRN
  Administered 2020-06-27: 60 ug/min via INTRAVENOUS

## 2020-06-27 MED ORDER — MORPHINE SULFATE (PF) 0.5 MG/ML IJ SOLN
INTRAMUSCULAR | Status: AC
  Filled 2020-06-27: qty 10

## 2020-06-27 SURGICAL SUPPLY — 35 items
BENZOIN TINCTURE PRP APPL 2/3 (GAUZE/BANDAGES/DRESSINGS) ×2 IMPLANT
CHLORAPREP W/TINT 26ML (MISCELLANEOUS) ×2 IMPLANT
CLAMP CORD UMBIL (MISCELLANEOUS) IMPLANT
CLOSURE STERI STRIP 1/2 X4 (GAUZE/BANDAGES/DRESSINGS) ×2 IMPLANT
CLOTH BEACON ORANGE TIMEOUT ST (SAFETY) ×2 IMPLANT
DRSG OPSITE POSTOP 4X10 (GAUZE/BANDAGES/DRESSINGS) ×2 IMPLANT
ELECT REM PT RETURN 9FT ADLT (ELECTROSURGICAL) ×2
ELECTRODE REM PT RTRN 9FT ADLT (ELECTROSURGICAL) ×1 IMPLANT
EXTRACTOR VACUUM M CUP 4 TUBE (SUCTIONS) IMPLANT
GLOVE BIO SURGEON STRL SZ 6.5 (GLOVE) ×2 IMPLANT
GLOVE BIOGEL PI IND STRL 7.0 (GLOVE) ×2 IMPLANT
GLOVE BIOGEL PI INDICATOR 7.0 (GLOVE) ×2
GOWN STRL REUS W/TWL LRG LVL3 (GOWN DISPOSABLE) ×4 IMPLANT
HOVERMATT SINGLE USE (MISCELLANEOUS) ×2 IMPLANT
KIT ABG SYR 3ML LUER SLIP (SYRINGE) IMPLANT
NEEDLE HYPO 22GX1.5 SAFETY (NEEDLE) IMPLANT
NEEDLE HYPO 25X5/8 SAFETYGLIDE (NEEDLE) IMPLANT
NS IRRIG 1000ML POUR BTL (IV SOLUTION) ×2 IMPLANT
PACK C SECTION WH (CUSTOM PROCEDURE TRAY) ×2 IMPLANT
PAD OB MATERNITY 4.3X12.25 (PERSONAL CARE ITEMS) ×2 IMPLANT
PENCIL SMOKE EVAC W/HOLSTER (ELECTROSURGICAL) ×2 IMPLANT
SPONGE LAP 18X18 RF (DISPOSABLE) ×2 IMPLANT
STRIP CLOSURE SKIN 1/2X4 (GAUZE/BANDAGES/DRESSINGS) IMPLANT
SUT MON AB 4-0 PS1 27 (SUTURE) ×4 IMPLANT
SUT PLAIN 0 NONE (SUTURE) IMPLANT
SUT PLAIN 2 0 XLH (SUTURE) IMPLANT
SUT VIC AB 0 CT1 36 (SUTURE) ×4 IMPLANT
SUT VIC AB 0 CTX 36 (SUTURE) ×10
SUT VIC AB 0 CTX36XBRD ANBCTRL (SUTURE) ×5 IMPLANT
SUT VIC AB 2-0 CT1 27 (SUTURE) ×2
SUT VIC AB 2-0 CT1 TAPERPNT 27 (SUTURE) ×1 IMPLANT
SYR CONTROL 10ML LL (SYRINGE) IMPLANT
TOWEL OR 17X24 6PK STRL BLUE (TOWEL DISPOSABLE) ×2 IMPLANT
TRAY FOLEY W/BAG SLVR 14FR LF (SET/KITS/TRAYS/PACK) IMPLANT
WATER STERILE IRR 1000ML POUR (IV SOLUTION) ×2 IMPLANT

## 2020-06-27 NOTE — Progress Notes (Signed)
MOB called out and reports increased bleeding. Pads had an egg size clot and was 50% saturated.Fundus midline and firm upon assessment.EBL of 117 ml. Pt reports minor dizziness at first which subsided after RN  Assessment. Pt is asymptomatic at this time with no complaints. Instructions given to call out if she notice increase bleeding or passing clots.

## 2020-06-27 NOTE — Brief Op Note (Signed)
06/27/2020  5:45 AM  PATIENT:  Tasha Hansen  34 y.o. female  PRE-OPERATIVE DIAGNOSIS:  Unscheduled Cesarean Section, See Delivery Summary Breech Active labor at 37 weeks, breech presentation  POST-OPERATIVE DIAGNOSIS:  Primary cesarean Section Breech presentation Active labor at 37 weeks, breech presentation  PROCEDURE:  Procedure(s) with comments: CESAREAN SECTION (N/A) - Breech Presentation Primary low transverse cesarean section with 2 layer closure  SURGEON:  Surgeon(s) and Role:    Aloha Gell, MD - Primary  PHYSICIAN ASSISTANT:   ASSISTANTS: Gavin Potters, CNM  ANESTHESIA:   spinal  EBL:  502 mL   BLOOD ADMINISTERED:none  DRAINS: Urinary Catheter (Foley)   LOCAL MEDICATIONS USED:  NONE  SPECIMEN:  Source of Specimen:  Placenta  DISPOSITION OF SPECIMEN:  To labor and delivery for disposal  COUNTS:  YES  TOURNIQUET:  * No tourniquets in log *  DICTATION: .Note written in EPIC  PLAN OF CARE: Admit to inpatient   PATIENT DISPOSITION:  PACU - hemodynamically stable.   Delay start of Pharmacological VTE agent (>24hrs) due to surgical blood loss or risk of bleeding: yes

## 2020-06-27 NOTE — Lactation Note (Signed)
This note was copied from a baby's chart. Lactation Consultation Note  Patient Name: Tasha Hansen DLOPR'A Date: 06/27/2020 Reason for consult: NICU baby;Initial assessment;Early term 54-38.6wks  Visited with mom of a 43 hours old ETI NICU female, mom came as breast/formula but she told LC that she has decided to just do formula. Lactation services no longer needed, mom aware of LCs at the hospital in case she changes her mind.   Maternal Data Formula Feeding for Exclusion: Yes Reason for exclusion: Mother's choice to formula and breast feed on admission  Feeding Feeding Type: Formula  LATCH Score                   Interventions    Lactation Tools Discussed/Used     Consult Status Consult Status: Complete Date: 06/27/20 Follow-up type: Call as needed    [place] 06/27/2020, 7:53 PM

## 2020-06-27 NOTE — H&P (Signed)
Tasha Hansen is a 34 y.o. G2P1001 at [redacted]w[redacted]d presenting for worsening contractions.  Patient notes overall feeling uncomfortable over the past several days but this evening began having regular consistent contractions in the mid abdomen that wraps around to the back.  Patient also notes onset of nausea. Good fetal movement, No vaginal bleeding, not leaking fluid.  Patient noted to have persistent breech presentation, AGA, failed version at 36 weeks.  [facility]are at [place] - Dated by LMP c/w 6 wk u/s - Anemia with low ferritin, on oral iron, s/p IV iron x 2 - GBS neg - GERD, Protonix     Prenatal Transfer Tool  Maternal Diabetes: No Genetic Screening: Normal Maternal Ultrasounds/Referrals: Normal Fetal Ultrasounds or other Referrals:  None Maternal Substance Abuse:  No Significant Maternal Medications:  None Significant Maternal Lab Results: Group B Strep negative     OB History    Gravida  2   Para  1   Term  1   Preterm      AB      Living  1     SAB      TAB      Ectopic      Multiple  0   Live Births  1          Past Medical History:  Diagnosis Date  . Allergy   . Anemia   . GERD (gastroesophageal reflux disease)   . Headache    "monthly" (08/27/2018)  . Meningitis 2013  . Migraine    "a few times/year" (08/27/2018)   Past Surgical History:  Procedure Laterality Date  . [place]   as an infant  . WISDOM TOOTH EXTRACTION  age 75   Family History: family history includes Diabetes in her maternal grandfather, maternal grandmother, paternal grandfather, and paternal grandmother; Hypertension in her father, maternal grandfather, maternal grandmother, paternal grandfather, and paternal grandmother. Social History:  reports that she has never smoked. She has never used smokeless tobacco. She reports that she does not drink alcohol and does not use drugs.  Review of Systems - Negative except Painful  contractions   Dilation: 4 Effacement (%): 50 Blood pressure 126/82, pulse (!) 104, temperature 98.2 F (36.8 C), resp. rate 20, weight 118.1 kg. Sacrum 0 station  Physical Exam:  Gen: well appearing, uncomfortable with contractions  Abd: gravid, NT, no RUQ pain LE: No edema, equal bilaterally, non-tender  Bedside ultrasound: Frank breech, anterior placenta, good fetal movement, adequate AFI  Prenatal labs: ABO, Rh: --/--/B POS (10/18 4034) Antibody: NEG (10/18 0738) Rubella: Immune (04/22 0000) RPR: Nonreactive (04/22 0000)  HBsAg: Negative (04/22 0000)  HIV: Non-reactive (04/22 0000)  GBS:   Negative 1 hr Glucola normal  Genetic screening normal Anatomy US normal  CBC    Component Value Date/Time   WBC 7.6 06/27/2020 0307   RBC 4.03 06/27/2020 0307   HGB 11.5 (L) 06/27/2020 0307   HGB 12.8 10/18/2018 0947   HGB 12.7 10/14/2014 1049   HCT 36.1 06/27/2020 0307   HCT 39.5 10/18/2018 0947   PLT 270 06/27/2020 0307   PLT 375 10/18/2018 0947   MCV 89.6 06/27/2020 0307   MCV 85 10/18/2018 0947   MCH 28.5 06/27/2020 0307   MCHC 31.9 06/27/2020 0307   RDW 15.5 06/27/2020 0307   RDW 12.9 10/18/2018 0947   LYMPHSABS 1.6 10/18/2018 0947   MONOABS 0.5 10/27/2013 0951   EOSABS 0.1 10/18/2018 0947   BASOSABS 0.0 10/18/2018  0947     Assessment/Plan: 34 y.o. G2P1001 at [redacted]w[redacted]d Breech presentation with active labor.  Prepped for C-section.  Patient understands the risk of prematurity. Anemia.  Status post IV iron x2 follow-up postpartum   Tasha Hansen 06/27/2020, 3:50 AM

## 2020-06-27 NOTE — Anesthesia Procedure Notes (Signed)
Spinal  Patient location during procedure: OR Start time: 06/27/2020 4:13 AM End time: 06/27/2020 4:17 AM Staffing Performed: anesthesiologist  Anesthesiologist: [name], DO Preanesthetic Checklist Completed: patient identified, IV checked, risks and benefits discussed, surgical consent, monitors and equipment checked, pre-op evaluation and timeout performed Spinal Block Patient position: sitting Prep: DuraPrep and site prepped and draped Patient monitoring: cardiac monitor, continuous pulse ox and blood pressure Approach: midline Location: L3-4 Injection technique: single-shot Needle Needle type: Pencan  Needle gauge: 24 G Needle length: 9 cm Assessment Sensory level: T6 Additional Notes Functioning IV was confirmed and monitors were applied. Sterile prep and drape, including hand hygiene and sterile gloves were used. The patient was positioned and the spine was prepped. The skin was anesthetized with lidocaine.  Free flow of clear CSF was obtained prior to injecting local anesthetic into the CSF.  The spinal needle aspirated freely following injection.  The needle was carefully withdrawn.  The patient tolerated the procedure well.

## 2020-06-27 NOTE — Anesthesia Postprocedure Evaluation (Signed)
Anesthesia Post Note  Patient: Tasha Hansen  Procedure(s) Performed: CESAREAN SECTION (N/A )     Patient location during evaluation: PACU Anesthesia Type: Spinal Level of consciousness: oriented and awake and alert Pain management: pain level controlled Vital Signs Assessment: post-procedure vital signs reviewed and stable Respiratory status: spontaneous breathing and respiratory function stable Cardiovascular status: blood pressure returned to baseline and stable Postop Assessment: no headache, no backache, no apparent nausea or vomiting and spinal receding Anesthetic complications: no   No complications documented.  Last Vitals:  Vitals:   06/27/20 0600 06/27/20 0615  BP: 129/74 (!) 110/56  Pulse:  93  Resp: (!) 26 18  Temp:      Last Pain:  Vitals:   06/27/20 0551  TempSrc: Oral  PainSc: 0-No pain   Pain Goal:    LLE Motor Response: Purposeful movement (06/27/20 0621) LLE Sensation: Tingling (06/27/20 9244) RLE Motor Response: Purposeful movement (06/27/20 6286) RLE Sensation: Tingling (06/27/20 3817)     Epidural/Spinal Function Cutaneous sensation: Tingles (06/27/20 7116), Patient able to flex knees: No (06/27/20 5790), Patient able to lift hips off bed: No (06/27/20 0621), Back pain beyond tenderness at insertion site: No (06/27/20 0621), Progressively worsening motor and/or sensory loss: No (06/27/20 3833), Bowel and/or bladder incontinence post epidural: No (06/27/20 3832)  [name]

## 2020-06-27 NOTE — Op Note (Signed)
06/27/2020  5:45 AM  PATIENT:  Tasha Hansen  34 y.o. female  PRE-OPERATIVE DIAGNOSIS:  Unscheduled Cesarean Section, See Delivery Summary Breech Active labor at 37 weeks, breech presentation  POST-OPERATIVE DIAGNOSIS:  Primary cesarean Section Breech presentation Active labor at 37 weeks, breech presentation  PROCEDURE:  Procedure(s) with comments: CESAREAN SECTION (N/A) - Breech Presentation Primary low transverse cesarean section with 2 layer closure  SURGEON:  Surgeon(s) and Role:    Aloha Gell, MD - Primary  PHYSICIAN ASSISTANT:   ASSISTANTS: Gavin Potters, CNM  ANESTHESIA:   spinal  EBL:  502 mL   BLOOD ADMINISTERED:none  DRAINS: Urinary Catheter (Foley)   LOCAL MEDICATIONS USED:  NONE  SPECIMEN:  Source of Specimen:  Placenta  DISPOSITION OF SPECIMEN:  To labor and delivery for disposal  COUNTS:  YES  TOURNIQUET:  * No tourniquets in log *  DICTATION: .Note written in EPIC  PLAN OF CARE: Admit to inpatient   PATIENT DISPOSITION:  PACU - hemodynamically stable.   Delay start of Pharmacological VTE agent (>24hrs) due to surgical blood loss or risk of bleeding: yes     Findings:  @BABYSEXEBC @ infant,  APGAR (1 MIN):   APGAR (5 MINS):   APGAR (10 MINS):   Normal uterus, tubes and bilateral ovaries with small ovarian cyst, normal placenta. 3VC, clear amniotic fluid, complete breech presentation  EBL: Per operative notes cc Antibiotics:   2g Ancef Complications: none  Indications: This is a 34 y.o. year-old, G2 P1-0-0-1 at [redacted]w[redacted]d admitted for active labor with known breech presentation for planned primary cesarean section.  Cervix was confirmed to be 4 cm dilated, patient with painful active contractions and bedside ultrasound confirmed breech presentation.. Risks benefits and alternatives of the procedure were discussed with the patient who agreed to proceed  Procedure:  After informed consent was obtained the patient was taken to the  operating room where spinal anesthesia was initiated.  She was prepped and draped in the normal sterile fashion in dorsal supine position with a leftward tilt.  A foley catheter was in place.  A Pfannenstiel skin incision was made 2 cm above the pubic symphysis in the midline with the scalpel.  Dissection was carried down with the Bovie cautery until the fascia was reached. The fascia was incised in the midline. The incision was extended laterally with the Mayo scissors. The inferior aspect of the fascial incision was grasped with the Coker clamps, elevated up and the underlying rectus muscles were dissected off sharply. The superior aspect of the fascial incision was grasped with the Coker clamps elevated up and the underlying rectus muscles were dissected off sharply.  The peritoneum was entered sharply. The peritoneal incision was extended superiorly and inferiorly with Hansen visualization of the bladder. The bladder blade was inserted and palpation was done to assess the fetal position and the location of the uterine vessels. The lower segment of the uterus was incised sharply with the scalpel and extended  bluntly in the cephalo-caudal fashion. The infant sacrum was grasped, brought to the incision, both legs were delivered in flexion, the right and left shoulders were delivered delivered without complication and the head was delivered in flexion.  The nose and mouth were bulb suctioned. The cord was clamped and cut after 35-second delay due to poor cry.  The infant was handed off to the waiting pediatrician and eventually taken to the NICU. The placenta was expressed. The uterus was unable to be exteriorized. The uterus was cleared of  all clots and debris. The uterine incision was repaired with 0 Vicryl in a running locked fashion.  A second layer of the same suture was used in an imbricating fashion.  Continued bleeding was noted from the right angle.  About 5 figure-of-eight sutures were used to control this  bleeding from active vessel.  The gutters were cleared of all clots and debris.  The tubes and ovaries were inspected.  Simple cysts were noted on both ovaries.  The uterine incision was reinspected and found to be hemostatic. The peritoneum was grasped and closed with 2-0 Vicryl in a running fashion. The cut muscle edges and the underside of the fascia were inspected and found to be hemostatic. The fascia was closed with 0 Vicryl in a single layer . The subcutaneous tissue was irrigated. Scarpa's layer was closed with a 2-0 plain gut suture. The skin was closed with a 4-0 Monocryl in a single layer. The patient tolerated the procedure well. Sponge lap and needle counts were correct x3 and patient was taken to the recovery room in a stable condition.  [name] 06/27/2020 5:46 AM

## 2020-06-27 NOTE — Progress Notes (Signed)
   06/27/20 1415  Vital Signs  BP 112/79  BP Location Left Arm  Patient Position (if appropriate) Semi-fowlers  BP Method Automatic  Pulse Rate 80  Pulse Rate Source Monitor  Resp 18  Temp 98 F (36.7 C)  Temp Source Axillary  POSS Scale (Pasero Opioid Sedation Scale)  POSS *See Group Information* 1-Acceptable,Awake and alert  PCA/Epidural/Spinal Assessment  Respiratory Pattern Regular  Oxygen Therapy  SpO2 99 %   vitals are stable.

## 2020-06-27 NOTE — Transfer of Care (Signed)
Immediate Anesthesia Transfer of Care Note  Patient: Tasha Hansen  Procedure(s) Performed: CESAREAN SECTION (N/A )  Patient Location: PACU  Anesthesia Type:Spinal  Level of Consciousness: awake, alert  and oriented  Airway & Oxygen Therapy: Patient Spontanous Breathing  Post-op Assessment: Report given to RN and Post -op Vital signs reviewed and stable  Post vital signs: Reviewed and stable  Last Vitals:  Vitals Value Taken Time  BP    Temp    Pulse    Resp    SpO2      Last Pain: There were no vitals filed for this visit.       Complications: No complications documented.

## 2020-06-27 NOTE — Anesthesia Preprocedure Evaluation (Signed)
Anesthesia Evaluation  Patient identified by MRN, date of birth, ID band Patient awake    Reviewed: Allergy & Precautions, NPO status , Patient's Chart, lab work & pertinent test results  Airway Mallampati: II  TM Distance: >3 FB Neck ROM: Full    Dental no notable dental hx.    Pulmonary neg pulmonary ROS,    Pulmonary exam normal breath sounds clear to auscultation       Cardiovascular negative cardio ROS Normal cardiovascular exam Rhythm:Regular Rate:Normal     Neuro/Psych  Headaches, negative psych ROS   GI/Hepatic Neg liver ROS, GERD  Medicated and Controlled,  Endo/Other  Morbid obesityBMI 43  Renal/GU negative Renal ROS  negative genitourinary   Musculoskeletal negative musculoskeletal ROS (+)   Abdominal (+) + obese,   Peds negative pediatric ROS (+)  Hematology  (+) Blood dyscrasia, anemia , hct 35.3, plt 293   Anesthesia Other Findings   Reproductive/Obstetrics (+) Pregnancy G2P1, 1 prior SVD Complete breech, presented to [facility]U SROM, dilated to 4cm and contracting                              Anesthesia Physical Anesthesia Plan  ASA: III and emergent  Anesthesia Plan: Spinal   Post-op Pain Management:    Induction:   PONV Risk Score and Plan: 2 and Propofol infusion and TIVA  Airway Management Planned: Natural Airway and Nasal Cannula  Additional Equipment: None  Intra-op Plan:   Post-operative Plan:   Informed Consent: I have reviewed the patients History and Physical, chart, labs and discussed the procedure including the risks, benefits and alternatives for the proposed anesthesia with the patient or authorized representative who has indicated his/her understanding and acceptance.       Plan Discussed with: CRNA  Anesthesia Plan Comments:         Anesthesia Quick Evaluation

## 2020-06-28 ENCOUNTER — Encounter (HOSPITAL_COMMUNITY): Payer: Self-pay | Admitting: Obstetrics

## 2020-06-28 DIAGNOSIS — D62 Acute posthemorrhagic anemia: Secondary | ICD-10-CM

## 2020-06-28 LAB — CBC
HCT: 31.1 % — ABNORMAL LOW (ref 36.0–46.0)
Hemoglobin: 9.8 g/dL — ABNORMAL LOW (ref 12.0–15.0)
MCH: 28.7 pg (ref 26.0–34.0)
MCHC: 31.5 g/dL (ref 30.0–36.0)
MCV: 91.2 fL (ref 80.0–100.0)
Platelets: 233 10*3/uL (ref 150–400)
RBC: 3.41 MIL/uL — ABNORMAL LOW (ref 3.87–5.11)
RDW: 15.7 % — ABNORMAL HIGH (ref 11.5–15.5)
WBC: 11.1 10*3/uL — ABNORMAL HIGH (ref 4.0–10.5)
nRBC: 0 % (ref 0.0–0.2)

## 2020-06-28 LAB — COMPREHENSIVE METABOLIC PANEL
ALT: 10 U/L (ref 0–44)
AST: 18 U/L (ref 15–41)
Albumin: 2 g/dL — ABNORMAL LOW (ref 3.5–5.0)
Alkaline Phosphatase: 93 U/L (ref 38–126)
Anion gap: 7 (ref 5–15)
BUN: 10 mg/dL (ref 6–20)
CO2: 22 mmol/L (ref 22–32)
Calcium: 8.7 mg/dL — ABNORMAL LOW (ref 8.9–10.3)
Chloride: 107 mmol/L (ref 98–111)
Creatinine, Ser: 1.27 mg/dL — ABNORMAL HIGH (ref 0.44–1.00)
GFR, Estimated: 57 mL/min — ABNORMAL LOW (ref >60)
Glucose, Bld: 91 mg/dL (ref 70–99)
Potassium: 4 mmol/L (ref 3.5–5.1)
Sodium: 136 mmol/L (ref 135–145)
Total Bilirubin: 0.8 mg/dL (ref 0.3–1.2)
Total Protein: 5.2 g/dL — ABNORMAL LOW (ref 6.5–8.1)

## 2020-06-28 MED ORDER — INFLUENZA VAC SPLIT QUAD 0.5 ML IM SUSY
0.5000 mL | PREFILLED_SYRINGE | INTRAMUSCULAR | Status: AC
  Administered 2020-06-29: 0.5 mL via INTRAMUSCULAR
  Filled 2020-06-28: qty 0.5

## 2020-06-28 MED ORDER — POLYSACCHARIDE IRON COMPLEX 150 MG PO CAPS
150.0000 mg | ORAL_CAPSULE | Freq: Every day | ORAL | Status: DC
  Administered 2020-06-29 – 2020-06-30 (×2): 150 mg via ORAL
  Filled 2020-06-28 (×2): qty 1

## 2020-06-28 MED ORDER — MAGNESIUM OXIDE 400 (241.3 MG) MG PO TABS
400.0000 mg | ORAL_TABLET | Freq: Every day | ORAL | Status: DC
  Administered 2020-06-29 – 2020-06-30 (×2): 400 mg via ORAL
  Filled 2020-06-28 (×2): qty 1

## 2020-06-28 NOTE — Progress Notes (Signed)
Subjective: POD# 1 Live born female  Birth Weight: 6 lb 4.5 oz (2850 g) APGAR: 4, 9  Newborn Delivery   Birth date/time: 06/27/2020 04:48:00 Delivery type: C-Section, Low Transverse Trial of labor: No C-section categorization: Primary     Baby name: Maddox Delivering provider: Aloha Gell   Circumcision Yes, planning inpatient Feeding: bottle  Pain control at delivery: Spinal   Reports feeling well. CNM visited patient in NICU. Baby doing well, may get to room in today or tomorrow. Pain is well controlled with Motrin. Not breastfeeding.   Patient reports tolerating PO.   Pain controlled with ibuprofen (OTC) Denies HA/SOB/C/P/N/V/dizziness. Flatus yes. She reports vaginal bleeding as normal, without clots. She is ambulating and urinating without difficulty.     Objective: Vitals:   06/27/20 1415 06/27/20 1629 06/28/20 0000 06/28/20 0610  BP: 112/79  110/66 102/67  Pulse: 80  77 80  Resp: 18 18 18 18   Temp: 98 F (36.7 C)  98.3 F (36.8 C) 98.1 F (36.7 C)  TempSrc: Axillary  Oral Oral  SpO2: 99% 100%    Weight:         Intake/Output Summary (Last 24 hours) at 06/28/2020 1309 Last data filed at 06/28/2020 0100 Gross per 24 hour  Intake 236 ml  Output 1367 ml  Net -1131 ml     Recent Labs    06/27/20 0307 06/28/20 0608  WBC 7.6 11.1*  HGB 11.5* 9.8*  HCT 36.1 31.1*  PLT 270 233    Blood type: --/--/B POS (10/24 7858)  Rubella: Immune (04/22 0000)   Vaccines:   TDaP          UTD           Flu             Offered at discharge                      COVID-19 UTD  Physical Exam:  General: alert and cooperative CV: Regular rate and rhythm Resp: clear Abdomen: soft, nontender, normal bowel sounds Incision: clean, dry and intact Uterine Fundus: firm, below umbilicus, nontender Lochia: moderate Ext: extremities normal, atraumatic, no cyanosis or edema and no edema, redness or tenderness in the calves or thighs  Assessment/Plan: 34 y.o.   POD# 1.  [id number]                  Principal Problem:   Postpartum care following cesarean delivery 10/24 Active Problems:   Breech presentation delivered   Status post primary low transverse cesarean section 10/24  Encourage rest when baby rests  Encourage to ambulate  Routine post-op care   Anemia associated with acute blood loss  Asymtomatic  Started PO Niferex and mag oxide  Anticipate discharge tomorrow.     Suzan Nailer, CNM, MSN 06/28/2020, 1:09 PM

## 2020-06-28 NOTE — Progress Notes (Signed)
Patient screened out for psychosocial assessment since none of the following apply:  Psychosocial stressors documented in mother or baby's chart  Gestation less than 32 weeks  Code at delivery   Infant with anomalies Please contact the Clinical Social Worker if specific needs arise, by MOB's request, or if MOB scores greater than 9/yes to question 10 on Edinburgh Postpartum Depression Screen.  Katura Eatherly, LCSW Clinical Social Worker [facility] Cell#: [phone]     

## 2020-06-29 LAB — COMPREHENSIVE METABOLIC PANEL
ALT: 6 U/L (ref 0–44)
AST: 29 U/L (ref 15–41)
Albumin: 2.3 g/dL — ABNORMAL LOW (ref 3.5–5.0)
Alkaline Phosphatase: 100 U/L (ref 38–126)
Anion gap: 8 (ref 5–15)
BUN: 13 mg/dL (ref 6–20)
CO2: 25 mmol/L (ref 22–32)
Calcium: 8.5 mg/dL — ABNORMAL LOW (ref 8.9–10.3)
Chloride: 104 mmol/L (ref 98–111)
Creatinine, Ser: 1.3 mg/dL — ABNORMAL HIGH (ref 0.44–1.00)
GFR, Estimated: 56 mL/min — ABNORMAL LOW (ref >60)
Glucose, Bld: 84 mg/dL (ref 70–99)
Potassium: 4.1 mmol/L (ref 3.5–5.1)
Sodium: 137 mmol/L (ref 135–145)
Total Bilirubin: 1 mg/dL (ref 0.3–1.2)
Total Protein: 5.6 g/dL — ABNORMAL LOW (ref 6.5–8.1)

## 2020-06-29 MED ORDER — LORATADINE 10 MG PO TABS
10.0000 mg | ORAL_TABLET | Freq: Every day | ORAL | Status: DC
  Administered 2020-06-30: 10 mg via ORAL
  Filled 2020-06-29: qty 1

## 2020-06-29 MED ORDER — ACETAMINOPHEN 500 MG PO TABS
1000.0000 mg | ORAL_TABLET | Freq: Four times a day (QID) | ORAL | Status: DC | PRN
  Administered 2020-06-30 (×2): 1000 mg via ORAL
  Filled 2020-06-29 (×2): qty 2

## 2020-06-29 NOTE — Progress Notes (Signed)
POSTOPERATIVE DAY # 2 S/P Primary LTCS for breech presentation, baby boy "Maddox"   S:         Reports feeling well, minimal discomfort             Tolerating po intake / no nausea / no vomiting / + flatus / + BM x 1 today  Denies dizziness, SOB, or CP             Bleeding is light             Pain controlled withMotrin, Tylenol, Oxycodone IR             Up ad lib / ambulatory/ voiding QS  Newborn is back from NICU and per peds planning d/c home tomorrow; formula feeding  / Circumcision - planning prior to d/c   O:  VS: BP 124/83 (BP Location: Right Arm)   Pulse 91   Temp 98.8 F (37.1 C) (Oral)   Resp 18   Wt 118.1 kg   SpO2 98%   Breastfeeding Unknown   BMI 43.32 kg/m   Patient Vitals for the past 24 hrs:  BP Temp Temp src Pulse Resp SpO2  06/29/20 0453 124/83 98.8 F (37.1 C) Oral 91 18 98 %  06/28/20 1930 116/71 98.3 F (36.8 C) Oral 81 16 100 %  06/28/20 1538 115/77 98.3 F (36.8 C) Oral 73 17 --     LABS:              Recent Labs    06/27/20 0307 06/28/20 0608  WBC 7.6 11.1*  HGB 11.5* 9.8*  PLT 270 233               Bloodtype: --/--/B POS (10/24 0307)  Rubella: Immune (04/22 0000)                                             I&O: Intake/Output      10/25 0701 - 10/26 0700 10/26 0701 - 10/27 0700   P.O.     I.V. (mL/kg) 1793 (15.2)    Total Intake(mL/kg) 1793 (15.2)    Urine (mL/kg/hr)     Blood     Total Output     Net +1793                      Physical Exam:             Alert and Oriented X3  Lungs: Clear and unlabored  Heart: regular rate and rhythm / no murmurs  Abdomen: soft, non-tender, mild gaseous distention, active bowel sounds in all quadrants             Fundus: firm, non-tender, U-2             Dressing: honeycomb dressing c/d/i               Incision:  approximated with sutures / no erythema / no ecchymosis / no drainage  Perineum: intact  Lochia: small rubra on pad   Extremities: trace LE edema, no calf pain or tenderness   A/P:        POD # 2 S/P Primary LTCS for breech            ABL Anemia compounding chronic IDA   - stable on oral Niferex daily   Elevated serum creatinine without hypertension   -  Repeat CMP today   Tylenol 1000mg  every 6 hours PRN for breakthrough pain   Claritin 10mg  daily per patient request  Routine postoperative care              Lactation support PRN  Continue current care  Breast care for formula feeding mothers reviewed   Planning circ today  Anticipate d/c home tomorrow   POC in consult with Dr. Darleene Cleaver, MSN, CNM [facility]

## 2020-06-29 NOTE — Progress Notes (Signed)
Interval note:  Patient Vitals for the past 24 hrs:  BP Temp Temp src Pulse Resp SpO2  06/29/20 1400 105/82 -- -- (!) 108 18 --  06/29/20 0453 124/83 98.8 F (37.1 C) Oral 91 18 98 %  06/28/20 1930 116/71 98.3 F (36.8 C) Oral 81 16 100 %   Results for orders placed or performed during the hospital encounter of 06/27/20 (from the past 24 hour(s))  Comprehensive metabolic panel     Status: Abnormal   Collection Time: 06/29/20 12:41 PM  Result Value Ref Range   Sodium 137 135 - 145 mmol/L   Potassium 4.1 3.5 - 5.1 mmol/L   Chloride 104 98 - 111 mmol/L   CO2 25 22 - 32 mmol/L   Glucose, Bld 84 70 - 99 mg/dL   BUN 13 6 - 20 mg/dL   Creatinine, Ser 1.30 (H) 0.44 - 1.00 mg/dL   Calcium 8.5 (L) 8.9 - 10.3 mg/dL   Total Protein 5.6 (L) 6.5 - 8.1 g/dL   Albumin 2.3 (L) 3.5 - 5.0 g/dL   AST 29 15 - 41 U/L   ALT 6 0 - 44 U/L   Alkaline Phosphatase 100 38 - 126 U/L   Total Bilirubin 1.0 0.3 - 1.2 mg/dL   GFR, Estimated 56 (L) >60 mL/min   Anion gap 8 5 - 15   Serum creatinine rising from 1.27 yesterday to 1.3. Discussed findings with Dr. Murrell Redden and will plan to repeat CBC and CMP tomorrow. Strict I&O while inpatient. BPs WNL, and no s/s of PEC. If creatinine is still rising tomorrow, will plan inpatient renal consult. POC in consult with Dr. Christiana Pellant, CNM

## 2020-06-29 NOTE — Progress Notes (Signed)
Strict intake and output explained to patient per order. Intiated with fluid intake @1630 . Patient verbalized understanding. Urine collection container placed in toilet. Instructed about additional labs to be drawn in the AM. Denies S/S of elevtaed BP.

## 2020-06-30 LAB — COMPREHENSIVE METABOLIC PANEL
ALT: 11 U/L (ref 0–44)
AST: 14 U/L — ABNORMAL LOW (ref 15–41)
Albumin: 2.1 g/dL — ABNORMAL LOW (ref 3.5–5.0)
Alkaline Phosphatase: 85 U/L (ref 38–126)
Anion gap: 12 (ref 5–15)
BUN: 10 mg/dL (ref 6–20)
CO2: 19 mmol/L — ABNORMAL LOW (ref 22–32)
Calcium: 8.4 mg/dL — ABNORMAL LOW (ref 8.9–10.3)
Chloride: 106 mmol/L (ref 98–111)
Creatinine, Ser: 1.03 mg/dL — ABNORMAL HIGH (ref 0.44–1.00)
GFR, Estimated: >60 mL/min (ref >60)
Glucose, Bld: 79 mg/dL (ref 70–99)
Potassium: 3.5 mmol/L (ref 3.5–5.1)
Sodium: 137 mmol/L (ref 135–145)
Total Bilirubin: 0.6 mg/dL (ref 0.3–1.2)
Total Protein: 5.3 g/dL — ABNORMAL LOW (ref 6.5–8.1)

## 2020-06-30 LAB — CBC
HCT: 29.2 % — ABNORMAL LOW (ref 36.0–46.0)
Hemoglobin: 9.3 g/dL — ABNORMAL LOW (ref 12.0–15.0)
MCH: 28.8 pg (ref 26.0–34.0)
MCHC: 31.8 g/dL (ref 30.0–36.0)
MCV: 90.4 fL (ref 80.0–100.0)
Platelets: 235 10*3/uL (ref 150–400)
RBC: 3.23 MIL/uL — ABNORMAL LOW (ref 3.87–5.11)
RDW: 15.6 % — ABNORMAL HIGH (ref 11.5–15.5)
WBC: 9.1 10*3/uL (ref 4.0–10.5)
nRBC: 0 % (ref 0.0–0.2)

## 2020-06-30 MED ORDER — SIMETHICONE 80 MG PO CHEW: 80.0000 mg | CHEWABLE_TABLET | ORAL | 0 refills | Status: DC | PRN

## 2020-06-30 MED ORDER — SENNOSIDES-DOCUSATE SODIUM 8.6-50 MG PO TABS: 2.0000 | ORAL_TABLET | ORAL | Status: DC

## 2020-06-30 MED ORDER — INFLUENZA VAC SPLIT QUAD 0.5 ML IM SUSY: 0.5000 mL | PREFILLED_SYRINGE | Freq: Once | INTRAMUSCULAR | Status: DC

## 2020-06-30 MED ORDER — OXYCODONE HCL 5 MG PO TABS: 5.0000 mg | ORAL_TABLET | ORAL | 0 refills | Status: DC | PRN

## 2020-06-30 MED ORDER — ACETAMINOPHEN 500 MG PO TABS: 1000.0000 mg | ORAL_TABLET | Freq: Four times a day (QID) | ORAL | 0 refills | Status: DC | PRN

## 2020-06-30 MED ORDER — IBUPROFEN 800 MG PO TABS
800.0000 mg | ORAL_TABLET | Freq: Three times a day (TID) | ORAL | 0 refills | Status: DC
Start: 2020-06-30 — End: 2022-05-25

## 2020-06-30 NOTE — Discharge Summary (Signed)
OB Discharge Summary  Patient Name: Tasha Hansen DOB: 06-Mar-1986 MRN: [id number]  Date of admission: 06/27/2020 Delivering provider: Aloha Gell   Admitting diagnosis: S/P cesarean section [Z98.891] Intrauterine pregnancy: [redacted]w[redacted]d     Secondary diagnosis: Patient Active Problem List   Diagnosis Date Noted  . Anemia associated with acute blood loss 06/28/2020  . Breech presentation delivered 06/27/2020  . Status post primary low transverse cesarean section 10/24 06/27/2020  . Postpartum care following cesarean delivery 10/24 06/27/2020   Additional problems:elevated creatinine level during hospital stay, improved on day of discharge.   Date of discharge: 06/30/2020   Discharge diagnosis: Principal Problem:   Postpartum care following cesarean delivery 10/24 Active Problems:   Breech presentation delivered   Status post primary low transverse cesarean section 10/24   Anemia associated with acute blood loss                                                              Post partum procedures:none  Augmentation: N/A Pain control: Spinal  Laceration:None  Episiotomy:None  Complications: None  Hospital course:    34 y.o. yo G2P2002 at [redacted]w[redacted]d was admitted to the hospital 06/27/2020 for breech presentation in labor and proceeded with cesarean section with the following indication:Malpresentation.Delivery details are as follows:  Membrane Rupture Time/Date: 4:47 AM ,06/27/2020   Delivery Method:C-Section, Low Transverse  Details of operation can be found in separate operative note.  Patient had an uncomplicated postoperative course. Incidental finding of elevated creatinine post-op  Day 1 of 1.27, repeat next day was 1.30, day of discharge noted creatinine dropped to 1.03. Patient voiding appropriately and adequate output. She is ambulating, tolerating a regular diet, passing flatus.. Patient is discharged home in stable condition on  06/30/20        Newborn Data: Birth  date:06/27/2019  Birth time:4:48 AM  Gender:Female  Living status:Living  Apgars:4 ,9  Weight:2850 g     Physical exam  Vitals:   06/29/20 0453 06/29/20 1400 06/29/20 2232 06/30/20 0605  BP: 124/83 105/82 120/75 124/82  Pulse: 91 (!) 108 100 91  Resp: 18 18 16 18   Temp: 98.8 F (37.1 C)  98.7 F (37.1 C) 98.6 F (37 C)  TempSrc: Oral  Oral Oral  SpO2: 98%  100%   Weight:       General: alert, cooperative and no distress Lochia: appropriate Uterine Fundus: firm Incision: Dressing is clean, dry, and intact DVT Evaluation: No cords or calf tenderness. No significant calf/ankle edema. Labs: Lab Results  Component Value Date   WBC 9.1 06/30/2020   HGB 9.3 (L) 06/30/2020   HCT 29.2 (L) 06/30/2020   MCV 90.4 06/30/2020   PLT 235 06/30/2020   CMP Latest Ref Rng & Units 06/30/2020  Glucose 70 - 99 mg/dL 79  BUN 6 - 20 mg/dL 10  Creatinine 0.44 - 1.00 mg/dL 1.03(H)  Sodium 135 - 145 mmol/L 137  Potassium 3.5 - 5.1 mmol/L 3.5  Chloride 98 - 111 mmol/L 106  CO2 22 - 32 mmol/L 19(L)  Calcium 8.9 - 10.3 mg/dL 8.4(L)  Total Protein 6.5 - 8.1 g/dL 5.3(L)  Total Bilirubin 0.3 - 1.2 mg/dL 0.6  Alkaline Phos 38 - 126 U/L 85  AST 15 - 41 U/L 14(L)  ALT 0 - 44 U/L 11  Edinburgh Postnatal Depression Scale Screening Tool 06/27/2020  I have been able to laugh and see the funny side of things. 0  I have looked forward with enjoyment to things. 1  I have blamed myself unnecessarily when things went wrong. 0  I have been anxious or worried for no good reason. 2  I have felt scared or panicky for no good reason. 1  Things have been getting on top of me. 2  I have been so unhappy that I have had difficulty sleeping. 1  I have felt sad or miserable. 1  I have been so unhappy that I have been crying. 0  The thought of harming myself has occurred to me. 0  Edinburgh Postnatal Depression Scale Total 8  Some encounter information is confidential and restricted. Go to Review Flowsheets  activity to see all data.   Vaccines: TDaP          UTD         Flu             UTD                    COVID-19 UTD  Discharge instruction:  per After Visit Summary,  [facility] booklet and  "Understanding Mother & Baby Care" hospital booklet  After Visit Meds:  Allergies as of 06/30/2020      Reactions   Diphenhydramine Hcl Itching   Oseltamivir Phosphate Itching      Medication List    STOP taking these medications   Loestrin 1/20 (21) 1-20 MG-MCG tablet Generic drug: norethindrone-ethinyl estradiol   neomycin-polymyxin-hydrocortisone OTIC solution Commonly known as: CORTISPORIN     TAKE these medications   acetaminophen 500 MG tablet Commonly known as: TYLENOL Take 2 tablets (1,000 mg total) by mouth every 6 (six) hours as needed for mild pain. What changed:   medication strength  how much to take  reasons to take this   cetirizine 10 MG tablet Commonly known as: ZYRTEC Take 10 mg by mouth daily.   ibuprofen 800 MG tablet Commonly known as: ADVIL Take 1 tablet (800 mg total) by mouth every 8 (eight) hours.   MULTIVITAMIN PO Take by mouth.   oxyCODONE 5 MG immediate release tablet Commonly known as: Oxy IR/ROXICODONE Take 1-2 tablets (5-10 mg total) by mouth every 4 (four) hours as needed for moderate pain.   senna-docusate 8.6-50 MG tablet Commonly known as: Senokot-S Take 2 tablets by mouth daily. Start taking on: July 01, 2020   simethicone 80 MG chewable tablet Commonly known as: MYLICON Chew 1 tablet (80 mg total) by mouth as needed for flatulence.       Diet: routine diet  Activity: Advance as tolerated. Pelvic rest for 6 weeks.   Postpartum contraception: Not Discussed  Newborn Data: Live born female  Birth Weight: 6 lb 4.5 oz (2850 g) APGAR: 4, 9  Newborn Delivery   Birth date/time: 06/27/2020 04:48:00 Delivery type: C-Section, Low Transverse Trial of labor: No C-section categorization: Primary      named Maddox Baby  Feeding: Bottle Disposition:home with mother   Delivery Report:  Review the Delivery Report for details.    Follow up:  Follow-up Information    Aloha Gell, MD. Schedule an appointment as soon as possible for a visit in 6 week(s).   Specialty: Obstetrics and Gynecology Contact information: [place] [address] [phone]  Signed: Juliene Pina, CNM, MSN 06/30/2020, 12:20 PM

## 2020-06-30 NOTE — Discharge Instructions (Signed)
Lactation outpatient support - home visit  Scherry Ran RN, MHA, IBCLC at [employer]: Lactation Consultant  [email or web address]    Additional resources:  [facility] [email or web address]   Chiropractic specialist   Dr. Marja Kays [email or web address]  Craniosacral therapy for baby  Roylene Reason  [email or web address]

## 2020-07-13 ENCOUNTER — Encounter (HOSPITAL_COMMUNITY)
Admission: AD | Disposition: A | Discharge status: Home / Self Care | Payer: Self-pay | Source: Home / Self Care | Attending: Obstetrics

## 2020-07-13 SURGERY — Surgical Case
Anesthesia: Regional

## 2020-11-19 ENCOUNTER — Emergency Department (HOSPITAL_COMMUNITY)
Admission: EM | Admit: 2020-11-19 | Discharge: 2020-11-19 | Disposition: A | Discharge status: Home / Self Care | Payer: 59 | Attending: Emergency Medicine | Admitting: Emergency Medicine

## 2020-11-19 ENCOUNTER — Encounter (HOSPITAL_COMMUNITY): Payer: Self-pay

## 2020-11-19 ENCOUNTER — Other Ambulatory Visit: Payer: Self-pay

## 2020-11-19 DIAGNOSIS — R0981 Nasal congestion: Secondary | ICD-10-CM | POA: Diagnosis not present

## 2020-11-19 DIAGNOSIS — R110 Nausea: Secondary | ICD-10-CM | POA: Diagnosis not present

## 2020-11-19 DIAGNOSIS — L539 Erythematous condition, unspecified: Secondary | ICD-10-CM | POA: Diagnosis not present

## 2020-11-19 DIAGNOSIS — J029 Acute pharyngitis, unspecified: Secondary | ICD-10-CM | POA: Insufficient documentation

## 2020-11-19 NOTE — ED Notes (Signed)
Patient will not allow strep swab to be collected. States her throat already feels better since she has been in the room.

## 2020-11-19 NOTE — ED Triage Notes (Signed)
Pt reports nasal congestion, sore throat, and nausea for 2 days.

## 2020-11-19 NOTE — ED Notes (Signed)
Patient left before discharge process could be complete. Unable to obtain vitals and review discharge instructions with patient. She stated she had to get home to her children and was unable to wait.

## 2020-11-19 NOTE — ED Provider Notes (Signed)
[place] [facility] DEPT Provider Note   CSN: [id number] Arrival date & time: 11/19/20  0032     History Chief Complaint  Patient presents with  . Sore Throat    Tasha Hansen is a 35 y.o. female with a hx of migraines & GERD who presents to the ED with complaints of sore throat x 2 days. Patient reports nasal congestion, sore throat, mildly hoarse voice, and a bit of nausea. Mild relief w/ tylenol. No other alleviating/aggravating factors. She denies fever, chills, cough, dyspnea, abdominal pain, chest pain, or vomiting.  HPI     Past Medical History:  Diagnosis Date  . Allergy   . Anemia   . GERD (gastroesophageal reflux disease)   . Headache    "monthly" (08/27/2018)  . Meningitis 2013  . Migraine    "a few times/year" (08/27/2018)    Patient Active Problem List   Diagnosis Date Noted  . Anemia associated with acute blood loss 06/28/2020  . Breech presentation delivered 06/27/2020  . Status post primary low transverse cesarean section 10/24 06/27/2020  . Postpartum care following cesarean delivery 10/24 06/27/2020    Past Surgical History:  Procedure Laterality Date  . CESAREAN SECTION N/A 06/27/2020   Procedure: CESAREAN SECTION;  Surgeon: Aloha Gell, MD;  Location: [facility];  Service: Obstetrics;  Laterality: N/A;  Breech Presentation  . [place]   as an infant  . WISDOM TOOTH EXTRACTION  age 5     OB History    Gravida  2   Para  2   Term  2   Preterm      AB      Living  2     SAB      IAB      Ectopic      Multiple  0   Live Births  2           Family History  Problem Relation Age of Onset  . Hypertension Father   . Hypertension Maternal Grandmother   . Diabetes Maternal Grandmother   . Hypertension Maternal Grandfather   . Diabetes Maternal Grandfather   . Hypertension Paternal Grandmother   . Diabetes Paternal Grandmother   . Diabetes Paternal Grandfather   .  Hypertension Paternal Grandfather     Social History   Tobacco Use  . Smoking status: Never Smoker  . Smokeless tobacco: Never Used  Vaping Use  . Vaping Use: Never used  Substance Use Topics  . Alcohol use: Never    Alcohol/week: 0.0 standard drinks  . Drug use: Never    Home Medications Prior to Admission medications   Medication Sig Start Date End Date Taking? Authorizing Provider  acetaminophen (TYLENOL) 500 MG tablet Take 2 tablets (1,000 mg total) by mouth every 6 (six) hours as needed for mild pain. 06/30/20   Juliene Pina, CNM  cetirizine (ZYRTEC) 10 MG tablet Take 10 mg by mouth daily.    [provider]  ibuprofen (ADVIL) 800 MG tablet Take 1 tablet (800 mg total) by mouth every 8 (eight) hours. 06/30/20   Juliene Pina, CNM  Multiple Vitamins-Minerals (MULTIVITAMIN PO) Take by mouth.    [provider]  oxyCODONE (OXY IR/ROXICODONE) 5 MG immediate release tablet Take 1-2 tablets (5-10 mg total) by mouth every 4 (four) hours as needed for moderate pain. 06/30/20   Juliene Pina, CNM  senna-docusate (SENOKOT-S) 8.6-50 MG tablet Take 2 tablets by mouth daily. 07/01/20  Juliene Pina, CNM  simethicone (MYLICON) 80 MG chewable tablet Chew 1 tablet (80 mg total) by mouth as needed for flatulence. 06/30/20   Juliene Pina, CNM    Allergies    Diphenhydramine hcl and Oseltamivir phosphate  Review of Systems   Review of Systems  Constitutional: Negative for chills and fever.  HENT: Positive for congestion and voice change. Negative for ear pain and sore throat.   Respiratory: Negative for shortness of breath.   Cardiovascular: Negative for chest pain.  Gastrointestinal: Positive for nausea. Negative for abdominal pain, diarrhea and vomiting.  Neurological: Negative for syncope.  All other systems reviewed and are negative.   Physical Exam Updated Vital Signs BP (!) 155/90 (BP Location: Right Arm)   Pulse 87   Temp 97.7 F (36.5 C) (Oral)    Resp 16   SpO2 99%   Physical Exam Vitals and nursing note reviewed.  Constitutional:      General: She is not in acute distress.    Appearance: She is well-developed.  HENT:     Head: Normocephalic and atraumatic.     Right Ear: Ear canal normal. Tympanic membrane is not perforated, erythematous, retracted or bulging.     Left Ear: Ear canal normal. Tympanic membrane is not perforated, erythematous, retracted or bulging.     Ears:     Comments: No mastoid erythema/swelling/tenderness.     Nose:     Right Sinus: No maxillary sinus tenderness or frontal sinus tenderness.     Left Sinus: No maxillary sinus tenderness or frontal sinus tenderness.     Mouth/Throat:     Pharynx: Uvula midline. Posterior oropharyngeal erythema (minimal) present. No oropharyngeal exudate.     Comments: Posterior oropharynx is symmetric appearing. Patient tolerating own secretions without difficulty. No trismus. No drooling. No hot potato voice. No swelling beneath the tongue, submandibular compartment is soft.  Eyes:     General:        Right eye: No discharge.        Left eye: No discharge.     Conjunctiva/sclera: Conjunctivae normal.     Pupils: Pupils are equal, round, and reactive to light.  Cardiovascular:     Rate and Rhythm: Normal rate and regular rhythm.     Heart sounds: No murmur heard.   Pulmonary:     Effort: Pulmonary effort is normal. No respiratory distress.     Breath sounds: Normal breath sounds. No wheezing, rhonchi or rales.  Abdominal:     General: There is no distension.     Palpations: Abdomen is soft.     Tenderness: There is no abdominal tenderness.  Musculoskeletal:     Cervical back: Normal range of motion and neck supple. No edema or rigidity.  Lymphadenopathy:     Cervical: No cervical adenopathy.  Skin:    General: Skin is warm and dry.     Findings: No rash.  Neurological:     Mental Status: She is alert.  Psychiatric:        Behavior: Behavior normal.      ED Results / Procedures / Treatments   Labs (all labs ordered are listed, but only abnormal results are displayed) Labs Reviewed  GROUP A STREP BY PCR    EKG None  Radiology No results found.  Procedures Procedures   Medications Ordered in ED Medications - No data to display  ED Course  I have reviewed the triage vital signs and the nursing notes.  Pertinent labs &  imaging results that were available during my care of the patient were reviewed by me and considered in my medical decision making (see chart for details).    MDM Rules/Calculators/A&P                         Patient presents to the ED with complaints of sore throat.   Patient is nontoxic, in no acute distress, vitals w/ mildly elevated BP- doubt HTN emergency.   Additional history obtained:  Additional history obtained from chart review & nursing note review.   Exam is without signs of AOM, AOE, or mastoiditis. No sinus tenderness. No meningeal signs. Lungs are CTA without focal adventitious sounds, no signs of increased work of breathing,  doubt CAP. She has mild erythema on exam of the posterior oropharynx, no findings to indicate RPA or PTA, we discussed option of strep swab was low suspicion which patient would like to proceed with.  We also discussed the option of a Covid swab which she declined.  ED Course:  Nursing staff attempted strep swab which patient was unable to tolerate. Upon my return to discussed plan of care at the patient had eloped.  She had informed nursing staff that she needed to get home to her children.  I overall suspect likely viral versus allergic.  Portions of this note were generated with [employer]. Dictation errors may occur despite best attempts at proofreading.   Final Clinical Impression(s) / ED Diagnoses Final diagnoses:  Sore throat    Rx / DC Orders ED Discharge Orders    None       Amaryllis Dyke, PA-C 11/19/20 0306    [place],  Jenny Reichmann, MD 11/19/20 [phone]

## 2020-11-21 ENCOUNTER — Other Ambulatory Visit: Payer: Self-pay

## 2020-11-21 ENCOUNTER — Ambulatory Visit (HOSPITAL_COMMUNITY)
Admission: EM | Admit: 2020-11-21 | Discharge: 2020-11-21 | Disposition: A | Discharge status: Home / Self Care | Payer: 59 | Attending: Urgent Care | Admitting: Urgent Care

## 2020-11-21 ENCOUNTER — Encounter (HOSPITAL_COMMUNITY): Payer: Self-pay

## 2020-11-21 DIAGNOSIS — H9203 Otalgia, bilateral: Secondary | ICD-10-CM | POA: Insufficient documentation

## 2020-11-21 DIAGNOSIS — H6693 Otitis media, unspecified, bilateral: Secondary | ICD-10-CM | POA: Insufficient documentation

## 2020-11-21 DIAGNOSIS — R070 Pain in throat: Secondary | ICD-10-CM | POA: Diagnosis not present

## 2020-11-21 DIAGNOSIS — H109 Unspecified conjunctivitis: Secondary | ICD-10-CM | POA: Diagnosis not present

## 2020-11-21 DIAGNOSIS — H1089 Other conjunctivitis: Secondary | ICD-10-CM | POA: Insufficient documentation

## 2020-11-21 DIAGNOSIS — H6690 Otitis media, unspecified, unspecified ear: Secondary | ICD-10-CM

## 2020-11-21 DIAGNOSIS — Z20822 Contact with and (suspected) exposure to covid-19: Secondary | ICD-10-CM | POA: Diagnosis not present

## 2020-11-21 LAB — SARS CORONAVIRUS 2 (TAT 6-24 HRS): SARS Coronavirus 2: NEGATIVE

## 2020-11-21 MED ORDER — TOBRAMYCIN 0.3 % OP OINT: 1.0000 "application " | TOPICAL_OINTMENT | Freq: Four times a day (QID) | OPHTHALMIC | 0 refills | Status: DC

## 2020-11-21 MED ORDER — CETIRIZINE HCL 10 MG PO TABS: 10.0000 mg | ORAL_TABLET | Freq: Every day | ORAL | 0 refills | Status: DC

## 2020-11-21 MED ORDER — PSEUDOEPHEDRINE HCL 60 MG PO TABS: 60.0000 mg | ORAL_TABLET | Freq: Three times a day (TID) | ORAL | 0 refills | Status: DC | PRN

## 2020-11-21 MED ORDER — AMOXICILLIN 875 MG PO TABS: 875.0000 mg | ORAL_TABLET | Freq: Two times a day (BID) | ORAL | 0 refills | Status: DC

## 2020-11-21 NOTE — ED Provider Notes (Addendum)
[place]   MRN: [id number] DOB: 10-23-1985  Subjective:   Tasha Hansen is a 35 y.o. female presenting for 5-day history of acute onset sinus congestion, throat pain, now having bilateral and worsening ear pain, left eye redness and discharge.  Patient would like to be checked for COVID-19.  She does have her vaccination and booster shot.  She has a child that actually had bilateral pinkeye prior to her getting her left eye redness and discomfort.  Has used over-the-counter medications and her throat pain is a lot better.  No current facility-administered medications for this encounter.  Current Outpatient Medications:  .  acetaminophen (TYLENOL) 500 MG tablet, Take 2 tablets (1,000 mg total) by mouth every 6 (six) hours as needed for mild pain., Disp: 30 tablet, Rfl: 0 .  cetirizine (ZYRTEC) 10 MG tablet, Take 10 mg by mouth daily., Disp: , Rfl:  .  ibuprofen (ADVIL) 800 MG tablet, Take 1 tablet (800 mg total) by mouth every 8 (eight) hours., Disp: 30 tablet, Rfl: 0 .  Multiple Vitamins-Minerals (MULTIVITAMIN PO), Take by mouth., Disp: , Rfl:  .  oxyCODONE (OXY IR/ROXICODONE) 5 MG immediate release tablet, Take 1-2 tablets (5-10 mg total) by mouth every 4 (four) hours as needed for moderate pain., Disp: 30 tablet, Rfl: 0 .  senna-docusate (SENOKOT-S) 8.6-50 MG tablet, Take 2 tablets by mouth daily., Disp: , Rfl:  .  simethicone (MYLICON) 80 MG chewable tablet, Chew 1 tablet (80 mg total) by mouth as needed for flatulence., Disp: 30 tablet, Rfl: 0   Allergies  Allergen Reactions  . Diphenhydramine Hcl Itching  . Oseltamivir Phosphate Itching    Past Medical History:  Diagnosis Date  . Allergy   . Anemia   . GERD (gastroesophageal reflux disease)   . Headache    "monthly" (08/27/2018)  . Meningitis 2013  . Migraine    "a few times/year" (08/27/2018)     Past Surgical History:  Procedure Laterality Date  . CESAREAN SECTION N/A 06/27/2020    Procedure: CESAREAN SECTION;  Surgeon: Aloha Gell, MD;  Location: [facility];  Service: Obstetrics;  Laterality: N/A;  Breech Presentation  . [place]   as an infant  . WISDOM TOOTH EXTRACTION  age 11    Family History  Problem Relation Age of Onset  . Hypertension Father   . Hypertension Maternal Grandmother   . Diabetes Maternal Grandmother   . Hypertension Maternal Grandfather   . Diabetes Maternal Grandfather   . Hypertension Paternal Grandmother   . Diabetes Paternal Grandmother   . Diabetes Paternal Grandfather   . Hypertension Paternal Grandfather     Social History   Tobacco Use  . Smoking status: Never Smoker  . Smokeless tobacco: Never Used  Vaping Use  . Vaping Use: Never used  Substance Use Topics  . Alcohol use: Never    Alcohol/week: 0.0 standard drinks  . Drug use: Never    ROS   Objective:   Vitals: Pulse 93   Temp 99.2 F (37.3 C) (Oral)   Resp 17   SpO2 93%   Physical Exam Constitutional:      General: She is not in acute distress.    Appearance: Normal appearance. She is well-developed. She is not ill-appearing, toxic-appearing or diaphoretic.  HENT:     Head: Normocephalic and atraumatic.     Right Ear: Ear canal and external ear normal. No drainage or tenderness. No middle ear effusion. There is  no impacted cerumen. Tympanic membrane is not erythematous.     Left Ear: Ear canal and external ear normal. No drainage or tenderness.  No middle ear effusion. There is no impacted cerumen. Tympanic membrane is not erythematous.     Ears:     Comments: Bulging and erythematous TMs bilaterally.    Nose: Nose normal. No congestion or rhinorrhea.     Mouth/Throat:     Mouth: Mucous membranes are moist. No oral lesions.     Pharynx: No pharyngeal swelling, oropharyngeal exudate, posterior oropharyngeal erythema or uvula swelling.     Tonsils: No tonsillar exudate or tonsillar abscesses.  Eyes:     General: No scleral  icterus.       Right eye: No discharge.        Left eye: Discharge present.    Extraocular Movements: Extraocular movements intact.     Right eye: Normal extraocular motion.     Left eye: Normal extraocular motion.     Conjunctiva/sclera: Conjunctivae normal.     Pupils: Pupils are equal, round, and reactive to light.     Comments: Injected conjunctive of the left eye.  Cardiovascular:     Rate and Rhythm: Normal rate and regular rhythm.     Pulses: Normal pulses.     Heart sounds: Normal heart sounds. No murmur heard. No friction rub. No gallop.   Pulmonary:     Effort: Pulmonary effort is normal. No respiratory distress.     Breath sounds: Normal breath sounds. No stridor. No wheezing, rhonchi or rales.  Musculoskeletal:     Cervical back: Normal range of motion and neck supple.  Lymphadenopathy:     Cervical: No cervical adenopathy.  Skin:    General: Skin is warm and dry.     Findings: No rash.  Neurological:     General: No focal deficit present.     Mental Status: She is alert and oriented to person, place, and time.  Psychiatric:        Mood and Affect: Mood normal.        Behavior: Behavior normal.        Thought Content: Thought content normal.        Judgment: Judgment normal.       Assessment and Plan :   PDMP not reviewed this encounter.  1. Acute otitis media, unspecified otitis media type   2. Otalgia of both ears   3. Bacterial conjunctivitis of left eye     Patient has bilateral otitis media, will use amoxicillin for this.  Supplement this with Zyrtec and Sudafed.  Recommended starting tobramycin for bacterial conjunctivitis of the left eye.  COVID-19 testing is pending. Counseled patient on potential for adverse effects with medications prescribed/recommended today, ER and return-to-clinic precautions discussed, patient verbalized understanding.    Jaynee Eagles, PA-C 11/21/20 1058

## 2020-11-21 NOTE — ED Triage Notes (Signed)
Pt presents with left eye irritation, congestion, and bilateral ear pain for past few days.

## 2020-12-01 ENCOUNTER — Ambulatory Visit (HOSPITAL_COMMUNITY)
Admission: EM | Admit: 2020-12-01 | Discharge: 2020-12-01 | Disposition: A | Discharge status: Home / Self Care | Payer: 59

## 2020-12-01 ENCOUNTER — Other Ambulatory Visit: Payer: Self-pay

## 2020-12-01 ENCOUNTER — Encounter (HOSPITAL_COMMUNITY): Payer: Self-pay | Admitting: Emergency Medicine

## 2020-12-01 DIAGNOSIS — H6983 Other specified disorders of Eustachian tube, bilateral: Secondary | ICD-10-CM

## 2020-12-01 DIAGNOSIS — J3089 Other allergic rhinitis: Secondary | ICD-10-CM | POA: Diagnosis not present

## 2020-12-01 MED ORDER — FLUTICASONE PROPIONATE 50 MCG/ACT NA SUSP: 1.0000 | Freq: Every day | NASAL | 2 refills | Status: DC

## 2020-12-01 MED ORDER — MONTELUKAST SODIUM 10 MG PO TABS: 10.0000 mg | ORAL_TABLET | Freq: Every day | ORAL | 2 refills | Status: DC

## 2020-12-01 MED ORDER — PREDNISONE 20 MG PO TABS: 40.0000 mg | ORAL_TABLET | Freq: Every day | ORAL | 0 refills | Status: DC

## 2020-12-01 NOTE — ED Triage Notes (Signed)
Pt presents today with c/o of bilateral ear pain and dry cough. Denies fever. She does report beings seen here recently and given Amoxicillin (completed yesterday).

## 2020-12-01 NOTE — ED Provider Notes (Addendum)
[place]    CSN: [id number] Arrival date & time: 12/01/20  1251      History   Chief Complaint Chief Complaint  Patient presents with  . Otalgia    bilateral  . Cough    HPI Tasha Hansen is a 35 y.o. female.   Pt presents today with c/o of bilateral ear pain and dry cough. Denies fever. She does report beings seen here recently and given Amoxicillin (completed yesterday).  Patient here today with about 2 weeks of bilateral ear pain, pressure, muffled hearing, dry cough.  She denies fever, chills, body aches, rashes, sore throat, congestion.  Was treated about a week ago for pinkeye and an ear infection with amoxicillin and tobramycin drops.  Eye issues resolved but ear issues did not improve.  She does have a history of seasonal allergies for which she takes Zyrtec daily.  Still has some congestion at times.  No new sick contacts.       Past Medical History:  Diagnosis Date  . Allergy   . Anemia   . GERD (gastroesophageal reflux disease)   . Headache    "monthly" (08/27/2018)  . Meningitis 2013  . Migraine    "a few times/year" (08/27/2018)    Patient Active Problem List   Diagnosis Date Noted  . Anemia associated with acute blood loss 06/28/2020  . Breech presentation delivered 06/27/2020  . Status post primary low transverse cesarean section 10/24 06/27/2020  . Postpartum care following cesarean delivery 10/24 06/27/2020    Past Surgical History:  Procedure Laterality Date  . CESAREAN SECTION N/A 06/27/2020   Procedure: CESAREAN SECTION;  Surgeon: Aloha Gell, MD;  Location: [facility];  Service: Obstetrics;  Laterality: N/A;  Breech Presentation  . [place]   as an infant  . WISDOM TOOTH EXTRACTION  age 58    OB History    Gravida  2   Para  2   Term  2   Preterm      AB      Living  2     SAB      IAB      Ectopic      Multiple  0   Live Births  2            Home Medications     Prior to Admission medications   Medication Sig Start Date End Date Taking? Authorizing Provider  acetaminophen (TYLENOL) 500 MG tablet Take 2 tablets (1,000 mg total) by mouth every 6 (six) hours as needed for mild pain. 06/30/20  Yes Juliene Pina, CNM  cetirizine (ZYRTEC ALLERGY) 10 MG tablet Take 1 tablet (10 mg total) by mouth daily. 11/21/20  Yes Jaynee Eagles, PA-C  dextromethorphan-guaiFENesin [facility] DM) 30-600 MG 12hr tablet Take 1 tablet by mouth 2 (two) times daily.   Yes [provider]  fluticasone (FLONASE) 50 MCG/ACT nasal spray Place 1 spray into both nostrils daily. 12/01/20  Yes Volney American, PA-C  JUNEL FE 1/20 1-20 MG-MCG tablet Take 1 tablet by mouth daily. 11/05/20  Yes [provider]  montelukast (SINGULAIR) 10 MG tablet Take 1 tablet (10 mg total) by mouth at bedtime. 12/01/20  Yes Volney American, PA-C  Multiple Vitamins-Minerals (MULTIVITAMIN PO) Take by mouth.   Yes [provider]  predniSONE (DELTASONE) 20 MG tablet Take 2 tablets (40 mg total) by mouth daily with breakfast. 12/01/20  Yes Volney American, PA-C  pseudoephedrine ([place])  60 MG tablet Take 1 tablet (60 mg total) by mouth every 8 (eight) hours as needed for congestion. 11/21/20  Yes Jaynee Eagles, PA-C  ibuprofen (ADVIL) 800 MG tablet Take 1 tablet (800 mg total) by mouth every 8 (eight) hours. 06/30/20   Juliene Pina, CNM  oxyCODONE (OXY IR/ROXICODONE) 5 MG immediate release tablet Take 1-2 tablets (5-10 mg total) by mouth every 4 (four) hours as needed for moderate pain. 06/30/20   Juliene Pina, CNM  senna-docusate (SENOKOT-S) 8.6-50 MG tablet Take 2 tablets by mouth daily. 07/01/20   Juliene Pina, CNM  simethicone (MYLICON) 80 MG chewable tablet Chew 1 tablet (80 mg total) by mouth as needed for flatulence. 06/30/20   Juliene Pina, CNM  tobramycin (TOBREX) 0.3 % ophthalmic ointment Place 1 application into the left eye 4 (four) times daily.  11/21/20   Jaynee Eagles, PA-C    Family History Family History  Problem Relation Age of Onset  . Hypertension Father   . Hypertension Maternal Grandmother   . Diabetes Maternal Grandmother   . Hypertension Maternal Grandfather   . Diabetes Maternal Grandfather   . Hypertension Paternal Grandmother   . Diabetes Paternal Grandmother   . Diabetes Paternal Grandfather   . Hypertension Paternal Grandfather     Social History Social History   Tobacco Use  . Smoking status: Never Smoker  . Smokeless tobacco: Never Used  Vaping Use  . Vaping Use: Never used  Substance Use Topics  . Alcohol use: Never    Alcohol/week: 0.0 standard drinks  . Drug use: Never     Allergies   Diphenhydramine hcl and Oseltamivir phosphate   Review of Systems Review of Systems Per HPI Physical Exam Triage Vital Signs ED Triage Vitals  Enc Vitals Group     BP 12/01/20 1315 120/76     Pulse Rate 12/01/20 1315 84     Resp 12/01/20 1315 20     Temp 12/01/20 1315 98.7 F (37.1 C)     Temp Source 12/01/20 1315 Oral     SpO2 --      Weight --      Height --      Head Circumference --      Peak Flow --      Pain Score 12/01/20 1310 4     Pain Loc --      Pain Edu? --      Excl. in [place]? --    No data found.  Updated Vital Signs BP 120/76 (BP Location: Left Arm)   Pulse 84   Temp 98.7 F (37.1 C) (Oral)   Resp 20   LMP 12/01/2020 (Exact Date)   Visual Acuity Right Eye Distance:   Left Eye Distance:   Bilateral Distance:    Right Eye Near:   Left Eye Near:    Bilateral Near:     Physical Exam Vitals and nursing note reviewed.  Constitutional:      Appearance: Normal appearance. She is not ill-appearing.  HENT:     Head: Atraumatic.     Ears:     Comments: Mild bilateral TM thickening, effusion    Nose: Rhinorrhea present.     Comments: Bilateral nasal turbinates boggy erythematous and edematous    Mouth/Throat:     Mouth: Mucous membranes are moist.     Pharynx: Oropharynx  is clear.  Eyes:     Extraocular Movements: Extraocular movements intact.     Conjunctiva/sclera: Conjunctivae normal.  Cardiovascular:  Rate and Rhythm: Normal rate and regular rhythm.     Heart sounds: Normal heart sounds.  Pulmonary:     Effort: Pulmonary effort is normal.     Breath sounds: Normal breath sounds. No wheezing or rales.  Musculoskeletal:        General: Normal range of motion.     Cervical back: Normal range of motion and neck supple.  Skin:    General: Skin is warm and dry.  Neurological:     Mental Status: She is alert and oriented to person, place, and time.  Psychiatric:        Mood and Affect: Mood normal.        Thought Content: Thought content normal.        Judgment: Judgment normal.     UC Treatments / Results  Labs (all labs ordered are listed, but only abnormal results are displayed) Labs Reviewed - No data to display  EKG   Radiology No results found.  Procedures Procedures (including critical care time)  Medications Ordered in UC Medications - No data to display  Initial Impression / Assessment and Plan / UC Course  I have reviewed the triage vital signs and the nursing notes.  Pertinent labs & imaging results that were available during my care of the patient were reviewed by me and considered in my medical decision making (see chart for details).     Your pain not improved with amoxicillin.  Suspect related to eustachian tube dysfunction and allergic rhinitis.  Will increase her allergy regimen with Singulair, Flonase twice daily in addition to her Zyrtec.  Prednisone burst also given to provide immediate relief of symptoms.  Discussed over-the-counter pain relievers for pain relief as needed.  Return for acutely worsening symptoms.  Final Clinical Impressions(s) / UC Diagnoses   Final diagnoses:  Dysfunction of both eustachian tubes  Seasonal allergic rhinitis due to other allergic trigger   Discharge Instructions   None     ED Prescriptions    Medication Sig Dispense Auth. Provider   predniSONE (DELTASONE) 20 MG tablet Take 2 tablets (40 mg total) by mouth daily with breakfast. 10 tablet Volney American, PA-C   montelukast (SINGULAIR) 10 MG tablet Take 1 tablet (10 mg total) by mouth at bedtime. 30 tablet Volney American, PA-C   fluticasone [facility]) 50 MCG/ACT nasal spray Place 1 spray into both nostrils daily. 16 g Volney American, [place]     PDMP not reviewed this encounter.   Volney American, [place] 12/01/20 [place], [place], [place] 12/01/20 1556

## 2021-01-12 DIAGNOSIS — U071 COVID-19: Secondary | ICD-10-CM | POA: Diagnosis not present

## 2021-01-12 DIAGNOSIS — H6501 Acute serous otitis media, right ear: Secondary | ICD-10-CM | POA: Diagnosis not present

## 2021-01-12 DIAGNOSIS — R519 Headache, unspecified: Secondary | ICD-10-CM | POA: Diagnosis not present

## 2021-01-14 ENCOUNTER — Encounter (HOSPITAL_COMMUNITY): Payer: Self-pay

## 2021-01-14 ENCOUNTER — Other Ambulatory Visit: Payer: Self-pay

## 2021-01-14 ENCOUNTER — Ambulatory Visit (HOSPITAL_COMMUNITY)
Admission: EM | Admit: 2021-01-14 | Discharge: 2021-01-14 | Disposition: A | Discharge status: Home / Self Care | Payer: 59 | Attending: Emergency Medicine | Admitting: Emergency Medicine

## 2021-01-14 DIAGNOSIS — J069 Acute upper respiratory infection, unspecified: Secondary | ICD-10-CM | POA: Diagnosis not present

## 2021-01-14 DIAGNOSIS — Z20822 Contact with and (suspected) exposure to covid-19: Secondary | ICD-10-CM | POA: Diagnosis not present

## 2021-01-14 NOTE — Discharge Instructions (Addendum)
We will contact you if the result of your COVID test if positive.  If it is positive, please quarantine for at least 5 days from your symptom onset or until you are without a fever without the use of medications for at least 24 hours.  You can take Tylenol and/or Ibuprofen as needed for fever reduction and pain relief.   For cough: honey 1/2 to 1 teaspoon (you can dilute the honey in water or another fluid).  You can use a humidifier for chest congestion and cough.  If you don't have a humidifier, you can sit in the bathroom with the hot shower running.    For sore throat: try warm salt water gargles, cepacol lozenges, throat spray, warm tea or water with lemon/honey, popsicles or ice, or OTC cold relief medicine for throat discomfort.    For congestion: take a daily anti-histamine like Zyrtec, Claritin, and a oral decongestant to help with post nasal drip that may be irritating your throat. You can also use Flonase 1-2 sprays in each nostril daily.      It is important to stay hydrated: drink plenty of fluids (water, gatorade/powerade/pedialyte, juices, or teas) to keep your throat moisturized and help further relieve irritation/discomfort.   Return or go to the Emergency Department if symptoms worsen or do not improve in the next few days.   [email or web address]

## 2021-01-14 NOTE — ED Provider Notes (Signed)
[place]    CSN: [id number] Arrival date & time: 01/14/21  [place]      History   Chief Complaint Chief Complaint  Patient presents with  . Fever  . Nausea  . Chills    HPI Tasha Hansen is a 35 y.o. female.   Patient here for evaluation of nasal congestion, headache, fever, and nausea that started yesterday.  States fever was 100.1 earlier today.  Has been taking OTC cold and flu medication.  Reports 2 family members recently tested positive for COVID.  Reports being vaccinated against COVID.  Denies any trauma, injury, or other precipitating event. Denies any chest pain, shortness of breath, N/V/D, numbness, tingling, weakness, abdominal pain, or headaches.   ROS: As per HPI, all other pertinent ROS negative   The history is provided by the patient.    Past Medical History:  Diagnosis Date  . Allergy   . Anemia   . GERD (gastroesophageal reflux disease)   . Headache    "monthly" (08/27/2018)  . Meningitis 2013  . Migraine    "a few times/year" (08/27/2018)    Patient Active Problem List   Diagnosis Date Noted  . Anemia associated with acute blood loss 06/28/2020  . Breech presentation delivered 06/27/2020  . Status post primary low transverse cesarean section 10/24 06/27/2020  . Postpartum care following cesarean delivery 10/24 06/27/2020    Past Surgical History:  Procedure Laterality Date  . CESAREAN SECTION N/A 06/27/2020   Procedure: CESAREAN SECTION;  Surgeon: Aloha Gell, MD;  Location: [facility];  Service: Obstetrics;  Laterality: N/A;  Breech Presentation  . [place]   as an infant  . WISDOM TOOTH EXTRACTION  age 32    OB History    Gravida  2   Para  2   Term  2   Preterm      AB      Living  2     SAB      IAB      Ectopic      Multiple  0   Live Births  2            Home Medications    Prior to Admission medications   Medication Sig Start Date End Date Taking? Authorizing  Provider  acetaminophen (TYLENOL) 500 MG tablet Take 2 tablets (1,000 mg total) by mouth every 6 (six) hours as needed for mild pain. 06/30/20   Juliene Pina, CNM  cetirizine (ZYRTEC ALLERGY) 10 MG tablet Take 1 tablet (10 mg total) by mouth daily. 11/21/20   Jaynee Eagles, PA-C  dextromethorphan-guaiFENesin [facility] DM) 30-600 MG 12hr tablet Take 1 tablet by mouth 2 (two) times daily.    [provider]  fluticasone (FLONASE) 50 MCG/ACT nasal spray Place 1 spray into both nostrils daily. 12/01/20   Volney American, PA-C  ibuprofen (ADVIL) 800 MG tablet Take 1 tablet (800 mg total) by mouth every 8 (eight) hours. 06/30/20   Juliene Pina, CNM  JUNEL FE 1/20 1-20 MG-MCG tablet Take 1 tablet by mouth daily. 11/05/20   [provider]  montelukast (SINGULAIR) 10 MG tablet Take 1 tablet (10 mg total) by mouth at bedtime. 12/01/20   Volney American, PA-C  Multiple Vitamins-Minerals (MULTIVITAMIN PO) Take by mouth.    [provider]  oxyCODONE (OXY IR/ROXICODONE) 5 MG immediate release tablet Take 1-2 tablets (5-10 mg total) by mouth every 4 (four) hours as needed for moderate  pain. 06/30/20   Juliene Pina, CNM  predniSONE (DELTASONE) 20 MG tablet Take 2 tablets (40 mg total) by mouth daily with breakfast. 12/01/20   Volney American, PA-C  pseudoephedrine (SUDAFED) 60 MG tablet Take 1 tablet (60 mg total) by mouth every 8 (eight) hours as needed for congestion. 11/21/20   Jaynee Eagles, PA-C  senna-docusate (SENOKOT-S) 8.6-50 MG tablet Take 2 tablets by mouth daily. 07/01/20   Juliene Pina, CNM  simethicone (MYLICON) 80 MG chewable tablet Chew 1 tablet (80 mg total) by mouth as needed for flatulence. 06/30/20   Juliene Pina, CNM  tobramycin (TOBREX) 0.3 % ophthalmic ointment Place 1 application into the left eye 4 (four) times daily. 11/21/20   Jaynee Eagles, PA-C    Family History Family History  Problem Relation Age of Onset  . Hypertension Father   .  Hypertension Maternal Grandmother   . Diabetes Maternal Grandmother   . Hypertension Maternal Grandfather   . Diabetes Maternal Grandfather   . Hypertension Paternal Grandmother   . Diabetes Paternal Grandmother   . Diabetes Paternal Grandfather   . Hypertension Paternal Grandfather     Social History Social History   Tobacco Use  . Smoking status: Never Smoker  . Smokeless tobacco: Never Used  Vaping Use  . Vaping Use: Never used  Substance Use Topics  . Alcohol use: Never    Alcohol/week: 0.0 standard drinks  . Drug use: Never     Allergies   Diphenhydramine hcl and Oseltamivir phosphate   Review of Systems Review of Systems  Constitutional: Positive for fever.  HENT: Positive for congestion.   Gastrointestinal: Positive for nausea.  Neurological: Positive for headaches.     Physical Exam Triage Vital Signs ED Triage Vitals  Enc Vitals Group     BP 01/14/21 1858 122/87     Pulse Rate 01/14/21 1858 (!) 121     Resp 01/14/21 1858 19     Temp 01/14/21 1858 99.3 F (37.4 C)     Temp Source 01/14/21 1858 Oral     SpO2 01/14/21 1858 99 %     Weight --      Height --      Head Circumference --      Peak Flow --      Pain Score 01/14/21 1856 0     Pain Loc --      Pain Edu? --      Excl. in [place]? --    No data found.  Updated Vital Signs BP 122/87 (BP Location: Right Arm)   Pulse (!) 121   Temp 99.3 F (37.4 C) (Oral)   Resp 19   LMP 12/28/2020 (Exact Date)   SpO2 99%   Breastfeeding No   Visual Acuity Right Eye Distance:   Left Eye Distance:   Bilateral Distance:    Right Eye Near:   Left Eye Near:    Bilateral Near:     Physical Exam Vitals and nursing note reviewed.  Constitutional:      General: She is not in acute distress.    Appearance: Normal appearance. She is not ill-appearing, toxic-appearing or diaphoretic.  HENT:     Head: Normocephalic and atraumatic.  Eyes:     Conjunctiva/sclera: Conjunctivae normal.  Cardiovascular:      Rate and Rhythm: Normal rate.     Pulses: Normal pulses.  Pulmonary:     Effort: Pulmonary effort is normal.  Abdominal:     General: Abdomen is flat.  Musculoskeletal:        General: Normal range of motion.     Cervical back: Normal range of motion.  Skin:    General: Skin is warm and dry.  Neurological:     General: No focal deficit present.     Mental Status: She is alert and oriented to person, place, and time.  Psychiatric:        Mood and Affect: Mood normal.      UC Treatments / Results  Labs (all labs ordered are listed, but only abnormal results are displayed) Labs Reviewed  SARS CORONAVIRUS 2 (TAT 6-24 HRS)    EKG   Radiology No results found.  Procedures Procedures (including critical care time)  Medications Ordered in UC Medications - No data to display  Initial Impression / Assessment and Plan / UC Course  I have reviewed the triage vital signs and the nursing notes.  Pertinent labs & imaging results that were available during my care of the patient were reviewed by me and considered in my medical decision making (see chart for details).    Assessment negative for red flags or concerns.  COVID test pending.  Discussed quarantining for at least 5 days from symptom onset if COVID test is positive.  Conservative symptom management discussed as described below in discharge instructions.  Patient given contact information for COVID treatment team and discussed antiviral options.  Follow-up as needed. Final Clinical Impressions(s) / UC Diagnoses   Final diagnoses:  Exposure to confirmed case of COVID-19  Viral upper respiratory tract infection     Discharge Instructions     We will contact you if the result of your COVID test if positive.  If it is positive, please quarantine for at least 5 days from your symptom onset or until you are without a fever without the use of medications for at least 24 hours.  You can take Tylenol and/or Ibuprofen as  needed for fever reduction and pain relief.   For cough: honey 1/2 to 1 teaspoon (you can dilute the honey in water or another fluid).  You can use a humidifier for chest congestion and cough.  If you don't have a humidifier, you can sit in the bathroom with the hot shower running.    For sore throat: try warm salt water gargles, cepacol lozenges, throat spray, warm tea or water with lemon/honey, popsicles or ice, or OTC cold relief medicine for throat discomfort.    For congestion: take a daily anti-histamine like Zyrtec, Claritin, and a oral decongestant to help with post nasal drip that may be irritating your throat. You can also use Flonase 1-2 sprays in each nostril daily.      It is important to stay hydrated: drink plenty of fluids (water, gatorade/powerade/pedialyte, juices, or teas) to keep your throat moisturized and help further relieve irritation/discomfort.   Return or go to the Emergency Department if symptoms worsen or do not improve in the next few days.   [email or web address]     ED Prescriptions    None     PDMP not reviewed this encounter.   Pearson Forster, NP 01/14/21 [phone]

## 2021-01-14 NOTE — ED Triage Notes (Signed)
Pt in with c/o nausea, runny nose, subjective fever and chills that started yesterday  Pt states her child has tested positive for covid  Pt has been taking advil cold and sinus for sx

## 2021-01-15 LAB — SARS CORONAVIRUS 2 (TAT 6-24 HRS): SARS Coronavirus 2: POSITIVE — AB

## 2021-01-16 ENCOUNTER — Telehealth: Payer: Self-pay | Admitting: Nurse Practitioner

## 2021-01-16 NOTE — Telephone Encounter (Signed)
Called to discuss with patient about COVID-19 symptoms and the use of one of the available treatments for those with mild to moderate Covid symptoms and at a high risk of hospitalization.  Pt appears to qualify for outpatient treatment due to co-morbid conditions and/or a member of an at-risk group in accordance with the FDA Emergency Use Authorization.    Symptom onset: 01/13/2021 Vaccinated: Yes Booster? Unsure Immunocompromised? No Qualifiers: obese, AA,  NIH Criteria: 3  Unable to reach pt - Voicemail and Mychart message sent. Patient does not have any recent labs, may be a candidate for oral therapy.   Alda Lea, NP [facility] [phone]

## 2021-02-07 DIAGNOSIS — Z1159 Encounter for screening for other viral diseases: Secondary | ICD-10-CM | POA: Diagnosis not present

## 2021-02-07 DIAGNOSIS — Z118 Encounter for screening for other infectious and parasitic diseases: Secondary | ICD-10-CM | POA: Diagnosis not present

## 2021-02-07 DIAGNOSIS — Z0000 Encounter for general adult medical examination without abnormal findings: Secondary | ICD-10-CM | POA: Diagnosis not present

## 2021-02-07 DIAGNOSIS — Z6839 Body mass index (BMI) 39.0-39.9, adult: Secondary | ICD-10-CM | POA: Diagnosis not present

## 2021-02-07 DIAGNOSIS — Z01419 Encounter for gynecological examination (general) (routine) without abnormal findings: Secondary | ICD-10-CM | POA: Diagnosis not present

## 2021-02-07 DIAGNOSIS — Z124 Encounter for screening for malignant neoplasm of cervix: Secondary | ICD-10-CM | POA: Diagnosis not present

## 2021-02-07 DIAGNOSIS — Z114 Encounter for screening for human immunodeficiency virus [HIV]: Secondary | ICD-10-CM | POA: Diagnosis not present

## 2021-02-07 DIAGNOSIS — Z131 Encounter for screening for diabetes mellitus: Secondary | ICD-10-CM | POA: Diagnosis not present

## 2021-02-07 DIAGNOSIS — Z3041 Encounter for surveillance of contraceptive pills: Secondary | ICD-10-CM | POA: Diagnosis not present

## 2021-02-07 DIAGNOSIS — Z01411 Encounter for gynecological examination (general) (routine) with abnormal findings: Secondary | ICD-10-CM | POA: Diagnosis not present

## 2021-02-07 DIAGNOSIS — E669 Obesity, unspecified: Secondary | ICD-10-CM | POA: Diagnosis not present

## 2021-02-07 DIAGNOSIS — Z113 Encounter for screening for infections with a predominantly sexual mode of transmission: Secondary | ICD-10-CM | POA: Diagnosis not present

## 2021-03-30 ENCOUNTER — Emergency Department (HOSPITAL_COMMUNITY)
Admission: EM | Admit: 2021-03-30 | Discharge: 2021-03-30 | Disposition: A | Discharge status: Home / Self Care | Payer: 59 | Attending: Student | Admitting: Student

## 2021-03-30 ENCOUNTER — Encounter (HOSPITAL_COMMUNITY): Payer: Self-pay | Admitting: Emergency Medicine

## 2021-03-30 DIAGNOSIS — R109 Unspecified abdominal pain: Secondary | ICD-10-CM | POA: Diagnosis not present

## 2021-03-30 DIAGNOSIS — Z118 Encounter for screening for other infectious and parasitic diseases: Secondary | ICD-10-CM | POA: Diagnosis not present

## 2021-03-30 DIAGNOSIS — R9431 Abnormal electrocardiogram [ECG] [EKG]: Secondary | ICD-10-CM | POA: Diagnosis not present

## 2021-03-30 DIAGNOSIS — M549 Dorsalgia, unspecified: Secondary | ICD-10-CM | POA: Insufficient documentation

## 2021-03-30 DIAGNOSIS — R110 Nausea: Secondary | ICD-10-CM | POA: Diagnosis not present

## 2021-03-30 DIAGNOSIS — Z5321 Procedure and treatment not carried out due to patient leaving prior to being seen by health care provider: Secondary | ICD-10-CM | POA: Diagnosis not present

## 2021-03-30 DIAGNOSIS — R3989 Other symptoms and signs involving the genitourinary system: Secondary | ICD-10-CM | POA: Diagnosis not present

## 2021-03-30 DIAGNOSIS — N739 Female pelvic inflammatory disease, unspecified: Secondary | ICD-10-CM | POA: Diagnosis not present

## 2021-03-30 DIAGNOSIS — R1030 Lower abdominal pain, unspecified: Secondary | ICD-10-CM | POA: Diagnosis not present

## 2021-03-30 DIAGNOSIS — N898 Other specified noninflammatory disorders of vagina: Secondary | ICD-10-CM | POA: Diagnosis not present

## 2021-03-30 DIAGNOSIS — R1031 Right lower quadrant pain: Secondary | ICD-10-CM | POA: Diagnosis not present

## 2021-03-30 LAB — CBC WITH DIFFERENTIAL/PLATELET
Abs Immature Granulocytes: 0.02 10*3/uL (ref 0.00–0.07)
Basophils Absolute: 0 10*3/uL (ref 0.0–0.1)
Basophils Relative: 0 %
Eosinophils Absolute: 0.1 10*3/uL (ref 0.0–0.5)
Eosinophils Relative: 1 %
HCT: 39.7 % (ref 36.0–46.0)
Hemoglobin: 12.5 g/dL (ref 12.0–15.0)
Immature Granulocytes: 0 %
Lymphocytes Relative: 21 %
Lymphs Abs: 1.8 10*3/uL (ref 0.7–4.0)
MCH: 27.4 pg (ref 26.0–34.0)
MCHC: 31.5 g/dL (ref 30.0–36.0)
MCV: 86.9 fL (ref 80.0–100.0)
Monocytes Absolute: 0.7 10*3/uL (ref 0.1–1.0)
Monocytes Relative: 8 %
Neutro Abs: 6 10*3/uL (ref 1.7–7.7)
Neutrophils Relative %: 70 %
Platelets: 297 10*3/uL (ref 150–400)
RBC: 4.57 MIL/uL (ref 3.87–5.11)
RDW: 12.5 % (ref 11.5–15.5)
WBC: 8.6 10*3/uL (ref 4.0–10.5)
nRBC: 0 % (ref 0.0–0.2)

## 2021-03-30 LAB — COMPREHENSIVE METABOLIC PANEL
ALT: 14 U/L (ref 0–44)
AST: 16 U/L (ref 15–41)
Albumin: 3.3 g/dL — ABNORMAL LOW (ref 3.5–5.0)
Alkaline Phosphatase: 77 U/L (ref 38–126)
Anion gap: 5 (ref 5–15)
BUN: 12 mg/dL (ref 6–20)
CO2: 22 mmol/L (ref 22–32)
Calcium: 8.8 mg/dL — ABNORMAL LOW (ref 8.9–10.3)
Chloride: 110 mmol/L (ref 98–111)
Creatinine, Ser: 1.05 mg/dL — ABNORMAL HIGH (ref 0.44–1.00)
GFR, Estimated: >60 mL/min (ref >60)
Glucose, Bld: 90 mg/dL (ref 70–99)
Potassium: 4.4 mmol/L (ref 3.5–5.1)
Sodium: 137 mmol/L (ref 135–145)
Total Bilirubin: 0.8 mg/dL (ref 0.3–1.2)
Total Protein: 7.3 g/dL (ref 6.5–8.1)

## 2021-03-30 LAB — HCG, QUANTITATIVE, PREGNANCY: hCG, Beta Chain, Quant, S: <1 m[IU]/mL (ref <5)

## 2021-03-30 LAB — LIPASE, BLOOD: Lipase: 30 U/L (ref 11–51)

## 2021-03-30 NOTE — ED Provider Notes (Addendum)
Emergency Medicine Provider Triage Evaluation Note  Tasha Hansen , a 35 y.o. female  was evaluated in triage.  Pt complains of right flank pain.  Pain is constant, radiates to her back.  No nausea or vomiting.  History of C-section October last year, denies any other abdominal surgeries.  No dysuria or hematuria.  Unprotected sex last Friday, partner does not have any symptoms.  She does not have any vaginal discharge, dysuria, hematuria..  Has tried Aleve without relief.  No history of kidney stone  Review of Systems  Positive: Right leg pain Negative: Nausea, vomiting, fevers, dysuria, hematuria  Physical Exam  BP 124/90 (BP Location: Right Arm)   Pulse 80   Temp 98.2 F (36.8 C) (Oral)   Resp 18   Ht '5\' 5"'$  (1.651 m)   Wt 106.6 kg   SpO2 99%   BMI 39.11 kg/m  Gen:   Awake, no distress   Resp:  Normal effort  MSK:   Moves extremities without difficulty  Other:  Right flank tenderness, but no CVA tenderness  Medical Decision Making  Medically screening exam initiated at 10:53 AM.  Appropriate orders placed.  Tasha Hansen was informed that the remainder of the evaluation will be completed by another provider, this initial triage assessment does not replace that evaluation, and the importance of remaining in the ED until their evaluation is complete.     Sherrill Raring, PA-C 03/30/21 1054    Sherrill Raring, PA-C 03/30/21 1340    Elnora Morrison, MD 03/31/21 [phone]

## 2021-03-30 NOTE — ED Triage Notes (Signed)
Pt endorses right sided abd pain/nausea and back pain since this AM. No pain when voiding.

## 2021-03-30 NOTE — ED Notes (Signed)
Pt called for vitals to be rechecked no response.

## 2021-03-30 NOTE — ED Notes (Signed)
Pt name called for vitals, no respone

## 2021-03-30 NOTE — ED Notes (Signed)
Called pt for vitals no response

## 2021-03-30 NOTE — ED Notes (Signed)
Called patient several times to bring back to recess and the PA to give her something for pain will try calling later

## 2021-04-05 DIAGNOSIS — N898 Other specified noninflammatory disorders of vagina: Secondary | ICD-10-CM | POA: Diagnosis not present

## 2021-04-05 DIAGNOSIS — R1030 Lower abdominal pain, unspecified: Secondary | ICD-10-CM | POA: Diagnosis not present

## 2022-03-13 DIAGNOSIS — Z1329 Encounter for screening for other suspected endocrine disorder: Secondary | ICD-10-CM | POA: Diagnosis not present

## 2022-03-13 DIAGNOSIS — Z131 Encounter for screening for diabetes mellitus: Secondary | ICD-10-CM | POA: Diagnosis not present

## 2022-03-13 DIAGNOSIS — Z13220 Encounter for screening for lipoid disorders: Secondary | ICD-10-CM | POA: Diagnosis not present

## 2022-03-13 DIAGNOSIS — Z6839 Body mass index (BMI) 39.0-39.9, adult: Secondary | ICD-10-CM | POA: Diagnosis not present

## 2022-03-13 DIAGNOSIS — Z01419 Encounter for gynecological examination (general) (routine) without abnormal findings: Secondary | ICD-10-CM | POA: Diagnosis not present

## 2022-03-13 DIAGNOSIS — Z0000 Encounter for general adult medical examination without abnormal findings: Secondary | ICD-10-CM | POA: Diagnosis not present

## 2022-04-17 ENCOUNTER — Encounter (HOSPITAL_BASED_OUTPATIENT_CLINIC_OR_DEPARTMENT_OTHER): Payer: Self-pay

## 2022-04-17 ENCOUNTER — Other Ambulatory Visit (HOSPITAL_BASED_OUTPATIENT_CLINIC_OR_DEPARTMENT_OTHER): Payer: Self-pay

## 2022-04-17 DIAGNOSIS — Z6839 Body mass index (BMI) 39.0-39.9, adult: Secondary | ICD-10-CM | POA: Diagnosis not present

## 2022-04-17 DIAGNOSIS — Z713 Dietary counseling and surveillance: Secondary | ICD-10-CM | POA: Diagnosis not present

## 2022-04-17 MED ORDER — WEGOVY 0.25 MG/0.5ML ~~LOC~~ SOAJ
SUBCUTANEOUS | 0 refills | Status: DC
  Filled 2022-04-17 – 2022-04-25 (×3): qty 2, 28d supply, fill #0

## 2022-04-19 ENCOUNTER — Other Ambulatory Visit (HOSPITAL_BASED_OUTPATIENT_CLINIC_OR_DEPARTMENT_OTHER): Payer: Self-pay

## 2022-04-20 ENCOUNTER — Other Ambulatory Visit (HOSPITAL_BASED_OUTPATIENT_CLINIC_OR_DEPARTMENT_OTHER): Payer: Self-pay

## 2022-04-21 ENCOUNTER — Encounter (HOSPITAL_BASED_OUTPATIENT_CLINIC_OR_DEPARTMENT_OTHER): Payer: Self-pay

## 2022-04-21 ENCOUNTER — Other Ambulatory Visit (HOSPITAL_BASED_OUTPATIENT_CLINIC_OR_DEPARTMENT_OTHER): Payer: Self-pay

## 2022-04-25 ENCOUNTER — Other Ambulatory Visit (HOSPITAL_BASED_OUTPATIENT_CLINIC_OR_DEPARTMENT_OTHER): Payer: Self-pay

## 2022-05-08 DIAGNOSIS — R350 Frequency of micturition: Secondary | ICD-10-CM | POA: Diagnosis not present

## 2022-05-08 DIAGNOSIS — N390 Urinary tract infection, site not specified: Secondary | ICD-10-CM | POA: Diagnosis not present

## 2022-05-08 DIAGNOSIS — R319 Hematuria, unspecified: Secondary | ICD-10-CM | POA: Diagnosis not present

## 2022-05-11 ENCOUNTER — Other Ambulatory Visit (HOSPITAL_BASED_OUTPATIENT_CLINIC_OR_DEPARTMENT_OTHER): Payer: Self-pay

## 2022-05-23 ENCOUNTER — Encounter (HOSPITAL_BASED_OUTPATIENT_CLINIC_OR_DEPARTMENT_OTHER): Payer: Self-pay | Admitting: Pharmacist

## 2022-05-23 ENCOUNTER — Emergency Department (HOSPITAL_BASED_OUTPATIENT_CLINIC_OR_DEPARTMENT_OTHER)
Admission: EM | Admit: 2022-05-23 | Discharge: 2022-05-24 | Disposition: A | Discharge status: Home / Self Care | Payer: 59 | Attending: Emergency Medicine | Admitting: Emergency Medicine

## 2022-05-23 ENCOUNTER — Emergency Department (HOSPITAL_COMMUNITY): Admission: EM | Admit: 2022-05-23 | Discharge: 2022-05-23 | Discharge status: Against Medical Advice | Payer: 59

## 2022-05-23 ENCOUNTER — Other Ambulatory Visit (HOSPITAL_BASED_OUTPATIENT_CLINIC_OR_DEPARTMENT_OTHER): Payer: Self-pay

## 2022-05-23 ENCOUNTER — Other Ambulatory Visit: Payer: Self-pay

## 2022-05-23 DIAGNOSIS — Z713 Dietary counseling and surveillance: Secondary | ICD-10-CM | POA: Diagnosis not present

## 2022-05-23 DIAGNOSIS — N390 Urinary tract infection, site not specified: Secondary | ICD-10-CM | POA: Diagnosis not present

## 2022-05-23 DIAGNOSIS — Z20822 Contact with and (suspected) exposure to covid-19: Secondary | ICD-10-CM | POA: Diagnosis not present

## 2022-05-23 DIAGNOSIS — Z79899 Other long term (current) drug therapy: Secondary | ICD-10-CM | POA: Diagnosis not present

## 2022-05-23 DIAGNOSIS — R300 Dysuria: Secondary | ICD-10-CM | POA: Diagnosis not present

## 2022-05-23 DIAGNOSIS — Z6839 Body mass index (BMI) 39.0-39.9, adult: Secondary | ICD-10-CM | POA: Diagnosis not present

## 2022-05-23 DIAGNOSIS — R509 Fever, unspecified: Secondary | ICD-10-CM | POA: Diagnosis present

## 2022-05-23 DIAGNOSIS — N12 Tubulo-interstitial nephritis, not specified as acute or chronic: Secondary | ICD-10-CM | POA: Insufficient documentation

## 2022-05-23 LAB — CBC WITH DIFFERENTIAL/PLATELET
Abs Immature Granulocytes: 0.02 10*3/uL (ref 0.00–0.07)
Basophils Absolute: 0 10*3/uL (ref 0.0–0.1)
Basophils Relative: 0 %
Eosinophils Absolute: 0.1 10*3/uL (ref 0.0–0.5)
Eosinophils Relative: 1 %
HCT: 38.3 % (ref 36.0–46.0)
Hemoglobin: 12.6 g/dL (ref 12.0–15.0)
Immature Granulocytes: 0 %
Lymphocytes Relative: 8 %
Lymphs Abs: 0.7 10*3/uL (ref 0.7–4.0)
MCH: 28 pg (ref 26.0–34.0)
MCHC: 32.9 g/dL (ref 30.0–36.0)
MCV: 85.1 fL (ref 80.0–100.0)
Monocytes Absolute: 0.5 10*3/uL (ref 0.1–1.0)
Monocytes Relative: 6 %
Neutro Abs: 7.5 10*3/uL (ref 1.7–7.7)
Neutrophils Relative %: 85 %
Platelets: 316 10*3/uL (ref 150–400)
RBC: 4.5 MIL/uL (ref 3.87–5.11)
RDW: 12.9 % (ref 11.5–15.5)
WBC: 8.9 10*3/uL (ref 4.0–10.5)
nRBC: 0 % (ref 0.0–0.2)

## 2022-05-23 MED ORDER — WEGOVY 1.7 MG/0.75ML ~~LOC~~ SOAJ: SUBCUTANEOUS | 0 refills | Status: DC

## 2022-05-23 MED ORDER — WEGOVY 0.5 MG/0.5ML ~~LOC~~ SOAJ
SUBCUTANEOUS | 0 refills | Status: DC
  Filled 2022-05-23: qty 2, 28d supply, fill #0

## 2022-05-23 MED ORDER — WEGOVY 1 MG/0.5ML ~~LOC~~ SOAJ: SUBCUTANEOUS | 0 refills | Status: DC

## 2022-05-23 NOTE — ED Triage Notes (Signed)
POV, pt sts that she's been receiving tx for UTI, finished keflex and started macrobid today due to still having symptoms, reports nausea and fevers at home, rapid HR and not able to think clearly. Pt alert and oriented x 4, ambulatory to triage.

## 2022-05-23 NOTE — ED Notes (Signed)
Pt stated that she wanted to leave and go to [employer]

## 2022-05-24 ENCOUNTER — Encounter (HOSPITAL_BASED_OUTPATIENT_CLINIC_OR_DEPARTMENT_OTHER): Payer: Self-pay

## 2022-05-24 ENCOUNTER — Emergency Department (HOSPITAL_BASED_OUTPATIENT_CLINIC_OR_DEPARTMENT_OTHER): Payer: 59

## 2022-05-24 ENCOUNTER — Other Ambulatory Visit: Payer: Self-pay

## 2022-05-24 ENCOUNTER — Encounter (HOSPITAL_COMMUNITY): Payer: Self-pay

## 2022-05-24 ENCOUNTER — Observation Stay (HOSPITAL_BASED_OUTPATIENT_CLINIC_OR_DEPARTMENT_OTHER)
Admission: EM | Admit: 2022-05-24 | Discharge: 2022-05-27 | Disposition: A | Discharge status: Home / Self Care | Payer: 59 | Attending: Internal Medicine | Admitting: Internal Medicine

## 2022-05-24 DIAGNOSIS — N12 Tubulo-interstitial nephritis, not specified as acute or chronic: Secondary | ICD-10-CM | POA: Diagnosis not present

## 2022-05-24 DIAGNOSIS — G039 Meningitis, unspecified: Secondary | ICD-10-CM

## 2022-05-24 DIAGNOSIS — R519 Headache, unspecified: Secondary | ICD-10-CM | POA: Diagnosis not present

## 2022-05-24 DIAGNOSIS — Z79899 Other long term (current) drug therapy: Secondary | ICD-10-CM | POA: Insufficient documentation

## 2022-05-24 DIAGNOSIS — R079 Chest pain, unspecified: Secondary | ICD-10-CM | POA: Insufficient documentation

## 2022-05-24 DIAGNOSIS — N390 Urinary tract infection, site not specified: Secondary | ICD-10-CM | POA: Diagnosis not present

## 2022-05-24 LAB — MENINGITIS/ENCEPHALITIS PANEL (CSF)

## 2022-05-24 LAB — CSF CELL COUNT WITH DIFFERENTIAL
Eosinophils, CSF: 0 % (ref 0–1)
Eosinophils, CSF: 0 % (ref 0–1)
Lymphs, CSF: 12 % — ABNORMAL LOW (ref 40–80)
Lymphs, CSF: 15 % — ABNORMAL LOW (ref 40–80)
Monocyte-Macrophage-Spinal Fluid: 23 % (ref 15–45)
Monocyte-Macrophage-Spinal Fluid: 23 % (ref 15–45)
RBC Count, CSF: 28 /mm3 — ABNORMAL HIGH
RBC Count, CSF: 335 /mm3 — ABNORMAL HIGH
Segmented Neutrophils-CSF: 61 % — ABNORMAL HIGH (ref 0–6)
Segmented Neutrophils-CSF: 65 % — ABNORMAL HIGH (ref 0–6)
Tube #: 1
Tube #: 4
WBC, CSF: 117 /mm3 (ref 0–5)
WBC, CSF: 93 /mm3 (ref 0–5)

## 2022-05-24 LAB — COMPREHENSIVE METABOLIC PANEL
ALT: 12 U/L (ref 0–44)
ALT: 12 U/L (ref 0–44)
AST: 13 U/L — ABNORMAL LOW (ref 15–41)
AST: 19 U/L (ref 15–41)
Albumin: 3.8 g/dL (ref 3.5–5.0)
Albumin: 3.9 g/dL (ref 3.5–5.0)
Alkaline Phosphatase: 67 U/L (ref 38–126)
Alkaline Phosphatase: 72 U/L (ref 38–126)
Anion gap: 9 (ref 5–15)
Anion gap: 9 (ref 5–15)
BUN: 8 mg/dL (ref 6–20)
BUN: 9 mg/dL (ref 6–20)
CO2: 22 mmol/L (ref 22–32)
CO2: 22 mmol/L (ref 22–32)
Calcium: 8.7 mg/dL — ABNORMAL LOW (ref 8.9–10.3)
Calcium: 9.1 mg/dL (ref 8.9–10.3)
Chloride: 106 mmol/L (ref 98–111)
Chloride: 107 mmol/L (ref 98–111)
Creatinine, Ser: 0.94 mg/dL (ref 0.44–1.00)
Creatinine, Ser: 0.95 mg/dL (ref 0.44–1.00)
GFR, Estimated: >60 mL/min (ref >60)
GFR, Estimated: >60 mL/min (ref >60)
Glucose, Bld: 90 mg/dL (ref 70–99)
Glucose, Bld: 98 mg/dL (ref 70–99)
Potassium: 3.8 mmol/L (ref 3.5–5.1)
Potassium: 4.1 mmol/L (ref 3.5–5.1)
Sodium: 137 mmol/L (ref 135–145)
Sodium: 138 mmol/L (ref 135–145)
Total Bilirubin: 0.7 mg/dL (ref 0.3–1.2)
Total Bilirubin: 0.8 mg/dL (ref 0.3–1.2)
Total Protein: 7.4 g/dL (ref 6.5–8.1)
Total Protein: 7.4 g/dL (ref 6.5–8.1)

## 2022-05-24 LAB — CBC WITH DIFFERENTIAL/PLATELET
Abs Immature Granulocytes: 0.02 10*3/uL (ref 0.00–0.07)
Basophils Absolute: 0 10*3/uL (ref 0.0–0.1)
Basophils Relative: 0 %
Eosinophils Absolute: 0.1 10*3/uL (ref 0.0–0.5)
Eosinophils Relative: 2 %
HCT: 37.6 % (ref 36.0–46.0)
Hemoglobin: 12.2 g/dL (ref 12.0–15.0)
Immature Granulocytes: 0 %
Lymphocytes Relative: 10 %
Lymphs Abs: 0.6 10*3/uL — ABNORMAL LOW (ref 0.7–4.0)
MCH: 27.8 pg (ref 26.0–34.0)
MCHC: 32.4 g/dL (ref 30.0–36.0)
MCV: 85.6 fL (ref 80.0–100.0)
Monocytes Absolute: 0.6 10*3/uL (ref 0.1–1.0)
Monocytes Relative: 9 %
Neutro Abs: 4.7 10*3/uL (ref 1.7–7.7)
Neutrophils Relative %: 79 %
Platelets: 318 10*3/uL (ref 150–400)
RBC: 4.39 MIL/uL (ref 3.87–5.11)
RDW: 12.9 % (ref 11.5–15.5)
WBC: 6 10*3/uL (ref 4.0–10.5)
nRBC: 0 % (ref 0.0–0.2)

## 2022-05-24 LAB — SARS CORONAVIRUS 2 BY RT PCR: SARS Coronavirus 2 by RT PCR: NEGATIVE

## 2022-05-24 LAB — URINALYSIS, ROUTINE W REFLEX MICROSCOPIC
Glucose, UA: NEGATIVE mg/dL
Ketones, ur: NEGATIVE mg/dL
Nitrite: POSITIVE — AB
Protein, ur: 30 mg/dL — AB
RBC / HPF: >50 RBC/hpf — ABNORMAL HIGH (ref 0–5)
Specific Gravity, Urine: 1.027 (ref 1.005–1.030)
WBC, UA: >50 WBC/hpf — ABNORMAL HIGH (ref 0–5)
pH: 6.5 (ref 5.0–8.0)

## 2022-05-24 LAB — HCG, SERUM, QUALITATIVE: Preg, Serum: NEGATIVE

## 2022-05-24 LAB — LACTIC ACID, PLASMA
Lactic Acid, Venous: 1.5 mmol/L (ref 0.5–1.9)
Lactic Acid, Venous: 1.5 mmol/L (ref 0.5–1.9)

## 2022-05-24 MED ORDER — SODIUM CHLORIDE 0.9 % IV SOLN
1.0000 g | INTRAVENOUS | Status: DC
  Administered 2022-05-24: 1 g via INTRAVENOUS
  Filled 2022-05-24: qty 10

## 2022-05-24 MED ORDER — VANCOMYCIN HCL IN DEXTROSE 1-5 GM/200ML-% IV SOLN
1000.0000 mg | Freq: Three times a day (TID) | INTRAVENOUS | Status: DC
  Administered 2022-05-25 – 2022-05-26 (×4): 1000 mg via INTRAVENOUS
  Filled 2022-05-24 (×5): qty 200

## 2022-05-24 MED ORDER — DEXAMETHASONE SODIUM PHOSPHATE 10 MG/ML IJ SOLN
10.0000 mg | Freq: Once | INTRAMUSCULAR | Status: AC
  Administered 2022-05-24: 10 mg via INTRAVENOUS
  Filled 2022-05-24: qty 1

## 2022-05-24 MED ORDER — ACETAMINOPHEN 500 MG PO TABS
1000.0000 mg | ORAL_TABLET | Freq: Four times a day (QID) | ORAL | Status: DC | PRN
  Administered 2022-05-24: 1000 mg via ORAL
  Filled 2022-05-24: qty 2

## 2022-05-24 MED ORDER — METOCLOPRAMIDE HCL 5 MG/ML IJ SOLN
10.0000 mg | Freq: Once | INTRAMUSCULAR | Status: AC
  Administered 2022-05-24: 10 mg via INTRAVENOUS
  Filled 2022-05-24: qty 2

## 2022-05-24 MED ORDER — CIPROFLOXACIN HCL 500 MG PO TABS: 500.0000 mg | ORAL_TABLET | Freq: Two times a day (BID) | ORAL | 0 refills | Status: DC

## 2022-05-24 MED ORDER — VANCOMYCIN HCL IN DEXTROSE 1-5 GM/200ML-% IV SOLN
1000.0000 mg | Freq: Once | INTRAVENOUS | Status: AC
  Administered 2022-05-24: 1000 mg via INTRAVENOUS
  Filled 2022-05-24: qty 200

## 2022-05-24 MED ORDER — LIDOCAINE-EPINEPHRINE (PF) 2 %-1:200000 IJ SOLN
20.0000 mL | Freq: Once | INTRAMUSCULAR | Status: AC
  Administered 2022-05-24: 20 mL
  Filled 2022-05-24: qty 20

## 2022-05-24 MED ORDER — FENTANYL CITRATE PF 50 MCG/ML IJ SOSY
50.0000 ug | PREFILLED_SYRINGE | Freq: Once | INTRAMUSCULAR | Status: AC
  Administered 2022-05-24: 50 ug via INTRAVENOUS
  Filled 2022-05-24: qty 1

## 2022-05-24 MED ORDER — ACETAMINOPHEN 325 MG PO TABS
650.0000 mg | ORAL_TABLET | Freq: Once | ORAL | Status: AC
  Administered 2022-05-24: 650 mg via ORAL
  Filled 2022-05-24: qty 2

## 2022-05-24 MED ORDER — SODIUM CHLORIDE 0.9 % IV BOLUS
1000.0000 mL | Freq: Once | INTRAVENOUS | Status: AC
  Administered 2022-05-24: 1000 mL via INTRAVENOUS

## 2022-05-24 MED ORDER — SODIUM CHLORIDE 0.9 % IV SOLN
2.0000 g | Freq: Two times a day (BID) | INTRAVENOUS | Status: AC
  Administered 2022-05-24 – 2022-05-26 (×4): 2 g via INTRAVENOUS
  Filled 2022-05-24 (×4): qty 20

## 2022-05-24 MED ORDER — VANCOMYCIN HCL IN DEXTROSE 1-5 GM/200ML-% IV SOLN
1000.0000 mg | Freq: Once | INTRAVENOUS | Status: AC
  Administered 2022-05-24: 1000 mg via INTRAVENOUS

## 2022-05-24 MED ORDER — VANCOMYCIN HCL IN DEXTROSE 1-5 GM/200ML-% IV SOLN
1000.0000 mg | Freq: Once | INTRAVENOUS | Status: DC
  Filled 2022-05-24: qty 200

## 2022-05-24 MED ORDER — ONDANSETRON HCL 4 MG/2ML IJ SOLN
4.0000 mg | Freq: Once | INTRAMUSCULAR | Status: AC
  Administered 2022-05-24: 4 mg via INTRAVENOUS
  Filled 2022-05-24: qty 2

## 2022-05-24 MED ORDER — KETOROLAC TROMETHAMINE 30 MG/ML IJ SOLN
30.0000 mg | Freq: Once | INTRAMUSCULAR | Status: AC
  Administered 2022-05-24: 30 mg via INTRAVENOUS
  Filled 2022-05-24: qty 1

## 2022-05-24 MED ORDER — SODIUM CHLORIDE 0.9 % IV SOLN
2.0000 g | Freq: Once | INTRAVENOUS | Status: DC
  Filled 2022-05-24: qty 20

## 2022-05-24 NOTE — Progress Notes (Addendum)
Pharmacy Antibiotic Note  Tasha Hansen is a 36 y.o. female admitted on 05/24/2022 with meningitis.  Pharmacy has been consulted for vancomycin dosing. Presented with dysuria, chills, and nausea. Previously treated for UTI with keflex, but symptoms had returned a few days ago and started Bactrim. Developed fever of 101 and had COVID exposure recently. WBC WNL, afebrile, Scr <1.  Plan: Vancomycin 2g x1 dose Vancomycin '1000mg'$  IV every 8 hours. Goal trough 15-20 for meningitis  Ceftriaxone 2g x1dose per MD Monitor cultures, WBC, fever curve, renal fucntion, and for clinical signs of improvement  Height: '5\' 5"'$  (165.1 cm) Weight: 106.6 kg (235 lb 0.2 oz) IBW/kg (Calculated) : 57  Temp (24hrs), Avg:98.6 F (37 C), Min:97.6 F (36.4 C), Max:99.6 F (37.6 C)  Recent Labs  Lab 05/23/22 2340 05/24/22 1528  WBC 8.9 6.0  CREATININE 0.95 0.94  LATICACIDVEN 1.5 1.5    Estimated Creatinine Clearance: 101.3 mL/min (by C-G formula based on SCr of 0.94 mg/dL).    Allergies  Allergen Reactions   Diphenhydramine Hcl Itching   Oseltamivir Phosphate Itching    Antimicrobials this admission: Ceftriaxone 9/20 >>  Vancomycin 9/20 >>   Microbiology results: 9/20 BCx: pending 9/20 CSF Cx: pending  Thank you for allowing pharmacy to be a part of this patient's care.  Sandford Craze, PharmD. [facility] Acute Care PGY-1 05/24/2022 7:10 PM

## 2022-05-24 NOTE — ED Triage Notes (Signed)
Patient here POV from Home.  Endorses Headache, Neck Pain, Nausea since Yesterday. Seen and Treated Early this AM for a UTI and discharged. States IV Administration of Toradol was ineffective and pain has worsened.   No Emesis. No Diarrhea. Fever of 101.0 Yesterday.   NAD Noted during Triage. A&Ox4. GCS 15. Ambulatory.

## 2022-05-24 NOTE — Discharge Instructions (Signed)
You were seen today for urinary symptoms and fever.  You likely have an early kidney infection.  Your antibiotics will be switched to Cipro.  Follow-up closely.  If you have any new or worsening symptoms, you should be reevaluated.

## 2022-05-24 NOTE — ED Provider Notes (Signed)
[facility]ER[facility]ENC[facility] DEPT Provider Note   CSN: [id number] Arrival date & time: 05/23/22  2311     History  Chief Complaint  Patient presents with   Urinary Tract Infection   Fever    Tasha Hansen is a 36 y.o. female.  HPI     This is a 36 year old female who presents with dysuria, chills and nausea.  Patient reports that she was treated for UTI in early September.  She finished a course of Keflex.  She thought she had improved.  However over the last several days has had recurrence of dysuria, frequency, urgency.  She saw her OB/GYN yesterday and was started on Bactrim.  She states tonight she developed chills and fever up to 101.  She took ibuprofen at home.  She denies any back pain.  She does report nausea without vomiting.  Denies upper respiratory symptoms but has had exposure to COVID.  Chart reviewed.  Urine dipstick 9/4 indicative of UTI.  No culture data available.  Home Medications Prior to Admission medications   Medication Sig Start Date End Date Taking? Authorizing Provider  ciprofloxacin (CIPRO) 500 MG tablet Take 1 tablet (500 mg total) by mouth every 12 (twelve) hours. 05/24/22  Yes Annie Saephan, Barbette Hair, MD  acetaminophen (TYLENOL) 500 MG tablet Take 2 tablets (1,000 mg total) by mouth every 6 (six) hours as needed for mild pain. 06/30/20   Juliene Pina, CNM  cetirizine (ZYRTEC ALLERGY) 10 MG tablet Take 1 tablet (10 mg total) by mouth daily. 11/21/20   Jaynee Eagles, PA-C  dextromethorphan-guaiFENesin [facility] DM) 30-600 MG 12hr tablet Take 1 tablet by mouth 2 (two) times daily.    [provider]  fluticasone (FLONASE) 50 MCG/ACT nasal spray Place 1 spray into both nostrils daily. 12/01/20   Volney American, PA-C  ibuprofen (ADVIL) 800 MG tablet Take 1 tablet (800 mg total) by mouth every 8 (eight) hours. 06/30/20   Juliene Pina, CNM  JUNEL FE 1/20 1-20 MG-MCG tablet Take 1 tablet by mouth daily. 11/05/20   [provider]  montelukast (SINGULAIR) 10 MG tablet Take 1 tablet (10 mg total) by mouth at bedtime. 12/01/20   Volney American, PA-C  Multiple Vitamins-Minerals (MULTIVITAMIN PO) Take by mouth.    [provider]  oxyCODONE (OXY IR/ROXICODONE) 5 MG immediate release tablet Take 1-2 tablets (5-10 mg total) by mouth every 4 (four) hours as needed for moderate pain. 06/30/20   Juliene Pina, CNM  predniSONE (DELTASONE) 20 MG tablet Take 2 tablets (40 mg total) by mouth daily with breakfast. 12/01/20   Volney American, PA-C  pseudoephedrine (SUDAFED) 60 MG tablet Take 1 tablet (60 mg total) by mouth every 8 (eight) hours as needed for congestion. 11/21/20   Jaynee Eagles, PA-C  Semaglutide-Weight Management (WEGOVY) 0.25 MG/0.5ML SOAJ Inject 0.5 mL every week by subcutaneous route as directed for 28 days. 04/17/22     Semaglutide-Weight Management (WEGOVY) 0.5 MG/0.5ML SOAJ Inject 0.5 mg under the skin every week. 05/23/22     Semaglutide-Weight Management (WEGOVY) 1 MG/0.5ML SOAJ Inject 1 mg under the skin every week. 05/23/22     Semaglutide-Weight Management (WEGOVY) 1.7 MG/0.75ML SOAJ Inject 1.7 milligrams under the skin every week. 05/23/22     senna-docusate (SENOKOT-S) 8.6-50 MG tablet Take 2 tablets by mouth daily. 07/01/20   Juliene Pina, CNM  simethicone (MYLICON) 80 MG chewable tablet Chew 1 tablet (80 mg total) by mouth as needed for flatulence. 06/30/20  Juliene Pina, CNM  tobramycin (TOBREX) 0.3 % ophthalmic ointment Place 1 application into the left eye 4 (four) times daily. 11/21/20   Jaynee Eagles, PA-C      Allergies    Diphenhydramine hcl and Oseltamivir phosphate    Review of Systems   Review of Systems  Constitutional:  Positive for fever.  Gastrointestinal:  Positive for nausea.  Genitourinary:  Positive for dysuria. Negative for flank pain.  All other systems reviewed and are negative.   Physical Exam Updated Vital Signs BP 118/79   Pulse 95   Temp 98.7 F  (37.1 C)   Resp (!) 23   Ht 1.651 m ('5\' 5"'$ )   Wt 106.6 kg   LMP 02/14/2022 (Approximate) Comment: takes birth control pills and skips last week and starts new pack  SpO2 98%   BMI 39.11 kg/m  Physical Exam Vitals and nursing note reviewed.  Constitutional:      Appearance: She is well-developed. She is obese. She is not ill-appearing.  HENT:     Head: Normocephalic and atraumatic.  Eyes:     Pupils: Pupils are equal, round, and reactive to light.  Cardiovascular:     Rate and Rhythm: Normal rate and regular rhythm.     Heart sounds: Normal heart sounds.  Pulmonary:     Effort: Pulmonary effort is normal. No respiratory distress.     Breath sounds: No wheezing.  Abdominal:     General: Bowel sounds are normal.     Palpations: Abdomen is soft.     Tenderness: There is no right CVA tenderness or left CVA tenderness.     Comments: Suprapubic tenderness palpation without rebound or guarding  Musculoskeletal:     Cervical back: Neck supple.  Skin:    General: Skin is warm and dry.  Neurological:     Mental Status: She is alert and oriented to person, place, and time.  Psychiatric:        Mood and Affect: Mood normal.     ED Results / Procedures / Treatments   Labs (all labs ordered are listed, but only abnormal results are displayed) Labs Reviewed  COMPREHENSIVE METABOLIC PANEL - Abnormal; Notable for the following components:      Result Value   AST 13 (*)    All other components within normal limits  URINALYSIS, ROUTINE W REFLEX MICROSCOPIC - Abnormal; Notable for the following components:   APPearance HAZY (*)    Hgb urine dipstick LARGE (*)    Bilirubin Urine SMALL (*)    Protein, ur 30 (*)    Nitrite POSITIVE (*)    Leukocytes,Ua MODERATE (*)    RBC / HPF >50 (*)    WBC, UA >50 (*)    Bacteria, UA RARE (*)    Non Squamous Epithelial 0-5 (*)    All other components within normal limits  SARS CORONAVIRUS 2 BY RT PCR  URINE CULTURE  CBC WITH  DIFFERENTIAL/PLATELET  LACTIC ACID, PLASMA  LACTIC ACID, PLASMA    EKG EKG Interpretation  Date/Time:  Tuesday May 23 2022 23:57:20 EDT Ventricular Rate:  110 PR Interval:  144 QRS Duration: 94 QT Interval:  331 QTC Calculation: 448 R Axis:   50 Text Interpretation: Sinus tachycardia Confirmed by Thayer Jew [phone]) on 05/24/2022 1:43:05 AM  Radiology No results found.  Procedures Procedures    Medications Ordered in ED Medications  acetaminophen (TYLENOL) tablet 1,000 mg (1,000 mg Oral Given 05/24/22 0009)  cefTRIAXone (ROCEPHIN) 1 g in sodium  chloride 0.9 % 100 mL IVPB (0 g Intravenous Stopped 05/24/22 0140)  ondansetron (ZOFRAN) injection 4 mg (4 mg Intravenous Given 05/24/22 0106)  sodium chloride 0.9 % bolus 1,000 mL (1,000 mLs Intravenous New Bag/Given 05/24/22 0107)  ketorolac (TORADOL) 30 MG/ML injection 30 mg (30 mg Intravenous Given 05/24/22 0106)    ED Course/ Medical Decision Making/ A&P                           Medical Decision Making Amount and/or Complexity of Data Reviewed Labs: ordered.  Risk OTC drugs. Prescription drug management.   This patient presents to the ED for concern of fever, dysuria, this involves an extensive number of treatment options, and is a complaint that carries with it a high risk of complications and morbidity.  I considered the following differential and admission for this acute, potentially life threatening condition.  The differential diagnosis includes cystitis, pyelonephritis, viral syndrome such as COVID-19, sepsis  MDM:    This is a 36 year old female who presents with ongoing urinary symptoms and fever.  She is nontoxic-appearing.  She is tachycardic.  Reports fever at home but temperature here is 98.7.  Sepsis work-up was initiated from triage.  Urinalysis appears overtly infected.  She has taken 2 doses of Bactrim at home.  We will send urine culture.  She was given IV Rocephin.  No leukocytosis.  No metabolic  derangement.  Normal lactate.  No signs or symptoms of sepsis.  Suspect she likely has an early pyelonephritis given fever.  Antibiotic course is complicated by recent treatment with Keflex and current treatment with Bactrim.  We discussed options including changing Bactrim back to Keflex or ciprofloxacin versus staying the course with Bactrim and following up culture.  Patient reports that she has had good response to ciprofloxacin in the past.  Feel that this is reasonable.  Urine culture is pending.  Patient was given strict return precautions.  (Labs, imaging, consults)  Labs: I Ordered, and personally interpreted labs.  The pertinent results include: CBC, BMP, lactate, urinalysis, urine culture  Imaging Studies ordered: I ordered imaging studies including none I independently visualized and interpreted imaging. I agree with the radiologist interpretation  Additional history obtained from chart review.  External records from outside source obtained and reviewed including prior urine studies  Cardiac Monitoring: The patient was maintained on a cardiac monitor.  I personally viewed and interpreted the cardiac monitored which showed an underlying rhythm of: Sinus rhythm  Reevaluation: After the interventions noted above, I reevaluated the patient and found that they have :improved  Social Determinants of Health: Lives independently, [employer] employee  Disposition: Discharge  Co morbidities that complicate the patient evaluation  Past Medical History:  Diagnosis Date   Allergy    Anemia    GERD (gastroesophageal reflux disease)    Headache    "monthly" (08/27/2018)   Meningitis 2013   Migraine    "a few times/year" (08/27/2018)     Medicines Meds ordered this encounter  Medications   acetaminophen (TYLENOL) tablet 1,000 mg   cefTRIAXone (ROCEPHIN) 1 g in sodium chloride 0.9 % 100 mL IVPB    Order Specific Question:   Antibiotic Indication:    Answer:   UTI    ondansetron (ZOFRAN) injection 4 mg   sodium chloride 0.9 % bolus 1,000 mL   ketorolac (TORADOL) 30 MG/ML injection 30 mg   ciprofloxacin (CIPRO) 500 MG tablet    Sig: Take 1  tablet (500 mg total) by mouth every 12 (twelve) hours.    Dispense:  14 tablet    Refill:  0    I have reviewed the patients home medicines and have made adjustments as needed  Problem List / ED Course: Problem List Items Addressed This Visit   None Visit Diagnoses     Pyelonephritis    -  Primary   Relevant Medications   cefTRIAXone (ROCEPHIN) 1 g in sodium chloride 0.9 % 100 mL IVPB                   Final Clinical Impression(s) / ED Diagnoses Final diagnoses:  Pyelonephritis    Rx / DC Orders ED Discharge Orders          Ordered    ciprofloxacin (CIPRO) 500 MG tablet  Every 12 hours        05/24/22 0221              Merryl Hacker, MD 05/24/22 [phone]

## 2022-05-24 NOTE — ED Notes (Signed)
Date and time results received: 05/24/22 2026 (use smartphrase ".now" to insert current time)  Test: spinal fluid WBC Critical Value: wbc  on # 1 93 / wbc on # 2 117  Name of Provider Notified: Ezequiel Essex, MD  Orders Received? Or Actions Taken?:  n/a

## 2022-05-24 NOTE — ED Provider Notes (Signed)
[facility] EMERGENC[facility] DEPT Provider Note   CSN: [id number] Arrival date & time: 05/24/22  1421     History  Chief Complaint  Patient presents with   Headache    Tasha Hansen is a 36 y.o. female.  Patient presents with concern for meningitis.  She has had meningitis twice in the past and this feels similar.  She endorses diffuse headache and neck pain as well as fever at home up to 102.  Headache is gradual in onset and associated with photophobia and nausea but no vomiting.  No focal weakness, numbness or tingling.  No chest pain, shortness of breath, cough, runny nose or sore throat.  She was seen in the ED yesterday and put on Cipro for suspected pyelonephritis which she is taking it.  She was on Bactrim for 1 day prior to this.  Denies any abdominal pain today or flank pain.  States her pain with urination and frequency are improving.  She states she did have a head neck pain last night but was not as severe and it is now.  Did improve with Toradol last night.  She had a negative COVID test yesterday.  No emesis or diarrhea. States this feels similar to last time she had meningitis.  The history is provided by the patient.  Headache Associated symptoms: fever, myalgias, nausea and weakness   Associated symptoms: no congestion, no cough, no sore throat and no vomiting        Home Medications Prior to Admission medications   Medication Sig Start Date End Date Taking? Authorizing Provider  acetaminophen (TYLENOL) 500 MG tablet Take 2 tablets (1,000 mg total) by mouth every 6 (six) hours as needed for mild pain. 06/30/20   Juliene Pina, CNM  cetirizine (ZYRTEC ALLERGY) 10 MG tablet Take 1 tablet (10 mg total) by mouth daily. 11/21/20   Jaynee Eagles, PA-C  ciprofloxacin (CIPRO) 500 MG tablet Take 1 tablet (500 mg total) by mouth every 12 (twelve) hours. 05/24/22   Horton, Barbette Hair, MD  dextromethorphan-guaiFENesin (MUCINEX DM) 30-600 MG 12hr tablet Take 1  tablet by mouth 2 (two) times daily.    [provider]  fluticasone (FLONASE) 50 MCG/ACT nasal spray Place 1 spray into both nostrils daily. 12/01/20   Volney American, PA-C  ibuprofen (ADVIL) 800 MG tablet Take 1 tablet (800 mg total) by mouth every 8 (eight) hours. 06/30/20   Juliene Pina, CNM  JUNEL FE 1/20 1-20 MG-MCG tablet Take 1 tablet by mouth daily. 11/05/20   [provider]  montelukast (SINGULAIR) 10 MG tablet Take 1 tablet (10 mg total) by mouth at bedtime. 12/01/20   Volney American, PA-C  Multiple Vitamins-Minerals (MULTIVITAMIN PO) Take by mouth.    [provider]  oxyCODONE (OXY IR/ROXICODONE) 5 MG immediate release tablet Take 1-2 tablets (5-10 mg total) by mouth every 4 (four) hours as needed for moderate pain. 06/30/20   Juliene Pina, CNM  predniSONE (DELTASONE) 20 MG tablet Take 2 tablets (40 mg total) by mouth daily with breakfast. 12/01/20   Volney American, PA-C  pseudoephedrine (SUDAFED) 60 MG tablet Take 1 tablet (60 mg total) by mouth every 8 (eight) hours as needed for congestion. 11/21/20   Jaynee Eagles, PA-C  Semaglutide-Weight Management (WEGOVY) 0.25 MG/0.5ML SOAJ Inject 0.5 mL every week by subcutaneous route as directed for 28 days. 04/17/22     Semaglutide-Weight Management (WEGOVY) 0.5 MG/0.5ML SOAJ Inject 0.5 mg under the skin every week. 05/23/22  Semaglutide-Weight Management (WEGOVY) 1 MG/0.5ML SOAJ Inject 1 mg under the skin every week. 05/23/22     Semaglutide-Weight Management (WEGOVY) 1.7 MG/0.75ML SOAJ Inject 1.7 milligrams under the skin every week. 05/23/22     senna-docusate (SENOKOT-S) 8.6-50 MG tablet Take 2 tablets by mouth daily. 07/01/20   Juliene Pina, CNM  simethicone (MYLICON) 80 MG chewable tablet Chew 1 tablet (80 mg total) by mouth as needed for flatulence. 06/30/20   Juliene Pina, CNM  sulfamethoxazole-trimethoprim (BACTRIM DS) 800-160 MG tablet Take 1 tablet by mouth 2 (two) times daily.  05/23/22   [provider]  tobramycin (TOBREX) 0.3 % ophthalmic ointment Place 1 application into the left eye 4 (four) times daily. 11/21/20   Jaynee Eagles, PA-C      Allergies    Diphenhydramine hcl and Oseltamivir phosphate    Review of Systems   Review of Systems  Constitutional:  Positive for fever. Negative for activity change and appetite change.  HENT:  Negative for congestion and sore throat.   Eyes:  Negative for visual disturbance.  Respiratory:  Negative for cough and shortness of breath.   Gastrointestinal:  Positive for nausea. Negative for vomiting.  Genitourinary:  Positive for dysuria, hematuria and urgency. Negative for flank pain.  Musculoskeletal:  Positive for arthralgias and myalgias.  Skin:  Negative for rash.  Neurological:  Positive for weakness and headaches.   all other systems are negative except as noted in the HPI and PMH.    Physical Exam Updated Vital Signs BP 129/89 (BP Location: Right Arm)   Pulse (!) 106   Temp 97.6 F (36.4 C)   Resp 18   Ht '5\' 5"'$  (1.651 m)   Wt 106.6 kg   LMP 02/14/2022 (Approximate) Comment: takes birth control pills and skips last week and starts new pack  SpO2 97%   BMI 39.11 kg/m  Physical Exam Vitals and nursing note reviewed.  Constitutional:      General: She is not in acute distress.    Appearance: She is well-developed.  HENT:     Head: Normocephalic and atraumatic.     Mouth/Throat:     Pharynx: No oropharyngeal exudate.  Eyes:     Conjunctiva/sclera: Conjunctivae normal.     Pupils: Pupils are equal, round, and reactive to light.  Neck:     Comments: No meningismus. Pain with range of motion but no rigidity or meningismus Cardiovascular:     Rate and Rhythm: Normal rate and regular rhythm.     Heart sounds: Normal heart sounds. No murmur heard. Pulmonary:     Effort: Pulmonary effort is normal. No respiratory distress.     Breath sounds: Normal breath sounds.  Chest:     Chest wall: No  tenderness.  Abdominal:     Palpations: Abdomen is soft.     Tenderness: There is no abdominal tenderness. There is no guarding or rebound.  Musculoskeletal:        General: No tenderness. Normal range of motion.     Cervical back: Normal range of motion and neck supple.     Comments: No CVA tenderness  Skin:    General: Skin is warm.  Neurological:     Mental Status: She is alert and oriented to person, place, and time.     Cranial Nerves: No cranial nerve deficit.     Motor: No abnormal muscle tone.     Coordination: Coordination normal.     Comments:  5/5 strength throughout. CN 2-12  intact.Equal grip strength.   Psychiatric:        Behavior: Behavior normal.     ED Results / Procedures / Treatments   Labs (all labs ordered are listed, but only abnormal results are displayed) Labs Reviewed  CSF CELL COUNT WITH DIFFERENTIAL - Abnormal; Notable for the following components:      Result Value   RBC Count, CSF 28 (*)    WBC, CSF 93 (*)    Segmented Neutrophils-CSF 61 (*)    Lymphs, CSF 15 (*)    All other components within normal limits  CSF CELL COUNT WITH DIFFERENTIAL - Abnormal; Notable for the following components:   RBC Count, CSF 335 (*)    WBC, CSF 117 (*)    Segmented Neutrophils-CSF 65 (*)    Lymphs, CSF 12 (*)    All other components within normal limits  CBC WITH DIFFERENTIAL/PLATELET - Abnormal; Notable for the following components:   Lymphs Abs 0.6 (*)    All other components within normal limits  COMPREHENSIVE METABOLIC PANEL - Abnormal; Notable for the following components:   Calcium 8.7 (*)    All other components within normal limits  CSF CULTURE W GRAM STAIN  CULTURE, BLOOD (ROUTINE X 2)  CULTURE, BLOOD (ROUTINE X 2)  MENINGITIS/ENCEPHALITIS PANEL (CSF)  LACTIC ACID, PLASMA  HCG, SERUM, QUALITATIVE  GLUCOSE, CSF  PROTEIN, CSF  PATHOLOGIST SMEAR REVIEW  PATHOLOGIST SMEAR REVIEW    EKG None  Radiology CT Head Wo Contrast  Result Date:  05/24/2022 CLINICAL DATA:  Headache, sudden and severe. EXAM: CT HEAD WITHOUT CONTRAST TECHNIQUE: Contiguous axial images were obtained from the base of the skull through the vertex without intravenous contrast. RADIATION DOSE REDUCTION: This exam was performed according to the departmental dose-optimization program which includes automated exposure control, adjustment of the mA and/or kV according to patient size and/or use of iterative reconstruction technique. COMPARISON:  None Available. FINDINGS: Brain: No evidence of acute infarction, hemorrhage, hydrocephalus, extra-axial collection or mass lesion/mass effect. Vascular: No hyperdense vessel or unexpected calcification. Skull: Normal. Negative for fracture or focal lesion. Sinuses/Orbits: No acute finding. Other: None. IMPRESSION: No acute intracranial abnormality. Electronically Signed   By: Keane Police D.O.   On: 05/24/2022 16:05    Procedures .Lumbar Puncture  Date/Time: 05/24/2022 5:04 PM  Performed by: Ezequiel Essex, MD Authorized by: Ezequiel Essex, MD   Consent:    Consent obtained:  Written   Consent given by:  Patient   Risks, benefits, and alternatives were discussed: yes     Risks discussed:  Nerve damage, infection, pain, repeat procedure, headache and bleeding Universal protocol:    Procedure explained and questions answered to patient or proxy's satisfaction: yes     Relevant documents present and verified: yes     Test results available: yes     Imaging studies available: yes     Required blood products, implants, devices, and special equipment available: yes     Immediately prior to procedure a time out was called: yes     Site/side marked: yes     Patient identity confirmed:  Verbally with patient Pre-procedure details:    Procedure purpose:  Diagnostic   Preparation: Patient was prepped and draped in usual sterile fashion   Anesthesia:    Anesthesia method:  Local infiltration   Local anesthetic:  Lidocaine 1%  WITH epi Procedure details:    Lumbar space:  L4-L5 interspace   Patient position:  Sitting   Needle gauge:  20  Needle type:  Spinal needle - Quincke tip   Needle length (in):  2.5   Ultrasound guidance: no     Number of attempts:  2   Fluid appearance:  Clear   Tubes of fluid:  4   Total volume (ml):  8 Post-procedure details:    Puncture site:  Adhesive bandage applied   Procedure completion:  Tolerated well, no immediate complications .Critical Care  Performed by: Ezequiel Essex, MD Authorized by: Ezequiel Essex, MD   Critical care provider statement:    Critical care time (minutes):  35   Critical care time was exclusive of:  Separately billable procedures and treating other patients   Critical care was necessary to treat or prevent imminent or life-threatening deterioration of the following conditions: meningitis.   Critical care was time spent personally by me on the following activities:  Development of treatment plan with patient or surrogate, discussions with consultants, evaluation of patient's response to treatment, examination of patient, ordering and review of laboratory studies, ordering and review of radiographic studies, ordering and performing treatments and interventions, pulse oximetry, re-evaluation of patient's condition, review of old charts and obtaining history from patient or surrogate   I assumed direction of critical care for this patient from another provider in my specialty: no     Care discussed with: admitting provider       Medications Ordered in ED Medications  sodium chloride 0.9 % bolus 1,000 mL (has no administration in time range)  ondansetron (ZOFRAN) injection 4 mg (has no administration in time range)    ED Course/ Medical Decision Making/ A&P                           Medical Decision Making Amount and/or Complexity of Data Reviewed Labs: ordered. Decision-making details documented in ED Course. Radiology: ordered and independent  interpretation performed. Decision-making details documented in ED Course. ECG/medicine tests: ordered and independent interpretation performed. Decision-making details documented in ED Course.  Risk OTC drugs. Prescription drug management. Decision regarding hospitalization.   2 days of progressively worsening headache, neck pain and fever.  Similar to previous presentations for meningitis in the past.  Notably currently being treated for UTI for the past 2 days.  Fever at home as well as headache and nausea and photophobia.  Patient's main concern is recurrent meningitis.  Symptoms feel similar though her presentation is complicated by recurrent UTI and pyelonephritis.  Febrile on arrival and tachycardic.  Infectious work-up pursued.  Patient given IV fluids after cultures obtained.  She has a known UTI is currently taking Cipro.  CT head is reassuring and shows no structural pathology.  There is concern for recurrent meningitis per patient and her mother.  Discussed that if she does have meningitis is likely viral rather than bacterial as she appears well and nontoxic.  CSF results are likely be difficult to interpret given the fact that she has been on antibiotics for the past 3 days.  Nevertheless we will rule out recurrent meningitis with lumbar puncture.  Patient and mother in agreement after risks and benefits discussed. Does have significant white blood cells in her CSF though the fluid appeared to be clear.  Gram stain is negative for organisms.  Bio fire panel is negative.  Suspect likely viral or aseptic meningitis.  Continue antibiotics overnight blood cultures are pending.  Admission Discussed with Dr. Myna Hidalgo       Final Clinical Impression(s) / ED Diagnoses Final  diagnoses:  Meningitis    Rx / DC Orders ED Discharge Orders     None         Tyler Robidoux, Annie Main, MD 05/24/22 2352

## 2022-05-24 NOTE — ED Notes (Signed)
Patient transported to CT 

## 2022-05-25 ENCOUNTER — Other Ambulatory Visit (HOSPITAL_BASED_OUTPATIENT_CLINIC_OR_DEPARTMENT_OTHER): Payer: Self-pay

## 2022-05-25 DIAGNOSIS — R519 Headache, unspecified: Secondary | ICD-10-CM

## 2022-05-25 DIAGNOSIS — Z79899 Other long term (current) drug therapy: Secondary | ICD-10-CM | POA: Diagnosis not present

## 2022-05-25 DIAGNOSIS — G039 Meningitis, unspecified: Secondary | ICD-10-CM | POA: Diagnosis not present

## 2022-05-25 DIAGNOSIS — N390 Urinary tract infection, site not specified: Secondary | ICD-10-CM

## 2022-05-25 DIAGNOSIS — R079 Chest pain, unspecified: Secondary | ICD-10-CM | POA: Diagnosis not present

## 2022-05-25 LAB — CBC
HCT: 39.7 % (ref 36.0–46.0)
Hemoglobin: 12.5 g/dL (ref 12.0–15.0)
MCH: 27.9 pg (ref 26.0–34.0)
MCHC: 31.5 g/dL (ref 30.0–36.0)
MCV: 88.6 fL (ref 80.0–100.0)
Platelets: 300 10*3/uL (ref 150–400)
RBC: 4.48 MIL/uL (ref 3.87–5.11)
RDW: 12.8 % (ref 11.5–15.5)
WBC: 5 10*3/uL (ref 4.0–10.5)
nRBC: 0 % (ref 0.0–0.2)

## 2022-05-25 LAB — BASIC METABOLIC PANEL
Anion gap: 4 — ABNORMAL LOW (ref 5–15)
BUN: 8 mg/dL (ref 6–20)
CO2: 22 mmol/L (ref 22–32)
Calcium: 8.5 mg/dL — ABNORMAL LOW (ref 8.9–10.3)
Chloride: 111 mmol/L (ref 98–111)
Creatinine, Ser: 0.75 mg/dL (ref 0.44–1.00)
GFR, Estimated: >60 mL/min (ref >60)
Glucose, Bld: 148 mg/dL — ABNORMAL HIGH (ref 70–99)
Potassium: 4.1 mmol/L (ref 3.5–5.1)
Sodium: 137 mmol/L (ref 135–145)

## 2022-05-25 LAB — HIV ANTIBODY (ROUTINE TESTING W REFLEX): HIV Screen 4th Generation wRfx: NONREACTIVE

## 2022-05-25 LAB — URINE CULTURE: Culture: 10000 — AB

## 2022-05-25 LAB — PROTEIN, CSF: Total  Protein, CSF: 58 mg/dL — ABNORMAL HIGH (ref 15–45)

## 2022-05-25 LAB — GLUCOSE, CSF: Glucose, CSF: 56 mg/dL (ref 40–70)

## 2022-05-25 MED ORDER — POLYETHYLENE GLYCOL 3350 17 G PO PACK: 17.0000 g | PACK | Freq: Every day | ORAL | Status: DC | PRN

## 2022-05-25 MED ORDER — ENOXAPARIN SODIUM 40 MG/0.4ML IJ SOSY
40.0000 mg | PREFILLED_SYRINGE | INTRAMUSCULAR | Status: DC
  Administered 2022-05-25 – 2022-05-26 (×2): 40 mg via SUBCUTANEOUS
  Filled 2022-05-25 (×2): qty 0.4

## 2022-05-25 MED ORDER — ACETAMINOPHEN 650 MG RE SUPP: 650.0000 mg | Freq: Four times a day (QID) | RECTAL | Status: DC | PRN

## 2022-05-25 MED ORDER — SODIUM CHLORIDE 0.9% FLUSH
3.0000 mL | Freq: Two times a day (BID) | INTRAVENOUS | Status: DC
  Administered 2022-05-25 – 2022-05-26 (×4): 3 mL via INTRAVENOUS

## 2022-05-25 MED ORDER — LORATADINE 10 MG PO TABS
10.0000 mg | ORAL_TABLET | Freq: Every day | ORAL | Status: DC
  Administered 2022-05-25 – 2022-05-27 (×3): 10 mg via ORAL
  Filled 2022-05-25 (×3): qty 1

## 2022-05-25 MED ORDER — ADULT MULTIVITAMIN W/MINERALS CH
1.0000 | ORAL_TABLET | Freq: Every day | ORAL | Status: DC
  Administered 2022-05-25 – 2022-05-27 (×3): 1 via ORAL
  Filled 2022-05-25 (×3): qty 1

## 2022-05-25 MED ORDER — ACETAMINOPHEN 325 MG PO TABS
650.0000 mg | ORAL_TABLET | Freq: Four times a day (QID) | ORAL | Status: DC | PRN
  Administered 2022-05-25 – 2022-05-27 (×3): 650 mg via ORAL
  Filled 2022-05-25 (×3): qty 2

## 2022-05-25 NOTE — Progress Notes (Signed)
PROGRESS NOTE Tasha Hansen  SAY:[id number] DOB: 1986/01/27 DOA: 05/24/2022 PCP: Marda Stalker, PA-C   Brief Narrative/Hospital Course: 70 old female with history significant for meningitis, anemia allergy GERD, migrainewho was treated for UTI earlier this month and then started Bactrim 1 DAY pta for recurrent sxs, returned to ED early with ongoing urinary symptoms and chills and was switched to ciprofloxacin, and 9/21 returned again with headache and neck pain  with associated neck pain, photophobia nausea also reported fever of 102 at home.In the ED Tmax 100.8, rest of the labs fairly stable CBC lactic acid COVID-19 negative.  Blood culture sent, negative head CT. She had LP in ED, wbc 93-117,No organisms on early culture . Started on vanco, Rocephin, and Decadron.Headache improving after arrival to [place]: Seen and examined.  Mother at the bedside.  Headache has completely resolved denies any neck stiffness fever chills or any urinary symptoms.  Mild pain at the LP site. Resting comfortably.  Assessment and Plan: Principal Problem:   Headache Active Problems:   UTI (urinary tract infection)   Headache Likeely Aseptic meningitis w/ History of meningitis x2 in the past: Headache has resolved since coming to the ED.  LP shows WBC 93, 117, RBC 28, 35, neutrophils 61, 65 lymphocytes 15, 21 total protein 58, clear Gram stain unremarkable culture pending.  Meningitis/encephalitis panel-negative.  Remains on ceftriaxone vancomycin, Unclear if the Bactrim she was on for cystitis could have made the CSF sterile-requested ID consult. Discussed w/ ID- advised neruo eval- consulted dn spoke w/ Dr Curly Shores- advised CT chest w/ to eval for sarcoidosis, avoid nsaid and outpatient neurology follow up  UTI Suspected pyelonephrosis: Antibiotics as above.  Urine culture in process from 9/19Was on Bactrim PTA.  Class II Obesity:Patient's Body mass index is 39.11 kg/m. : Will benefit  with PCP follow-up, weight loss  healthy lifestyle and outpatient sleep evaluation.   DVT prophylaxis: enoxaparin (LOVENOX) injection 40 mg Start: 05/25/22 1000 Code Status:   Code Status: Full Code Family Communication: plan of care discussed with patient/mother at bedside. Patient status is: Observation but remains hospitalized for ongoing work-up   Level of care: Telemetry  Dispo: The patient is from: Home            Anticipated disposition: Home TBD  Mobility Assessment (last 72 hours)     Mobility Assessment     Row Name 05/25/22 0400 05/25/22 0038         Does patient have an order for bedrest or is patient medically unstable No - Continue assessment No - Continue assessment      What is the highest level of mobility based on the progressive mobility assessment? Level 6 (Walks independently in room and hall) - Balance while walking in room without assist - Complete --                Objective: Vitals last 24 hrs: Vitals:   05/24/22 2251 05/24/22 2330 05/25/22 0034 05/25/22 0423  BP: 125/85 101/72 124/85 107/68  Pulse: (!) 107 (!) 109 (!) 110 (!) 103  Resp: '18 18 17 19  '$ Temp: (!) 100.4 F (38 C)  99 F (37.2 C) 98.5 F (36.9 C)  TempSrc: Oral  Oral Oral  SpO2: 97% 95% 98% 100%  Weight:      Height:       Weight change:   Physical Examination: General exam: alert awake, no neck stiffness older than stated age, weak appearing. HEENT:Oral mucosa moist, Ear/Nose  WNL grossly, dentition normal. Respiratory system: bilaterally clear, no use of accessory muscle Cardiovascular system: S1 & S2 +, No JVD. Gastrointestinal system: Abdomen soft,NT,ND, BS+ Nervous System:Alert, awake, moving extremities and grossly nonfocal Extremities: LE edema NEG,distal peripheral pulses palpable.  Skin: No rashes,no icterus. MSK: Normal muscle bulk,tone, power  Medications reviewed:  Scheduled Meds:  enoxaparin (LOVENOX) injection  40 mg Subcutaneous Q24H   sodium chloride  flush  3 mL Intravenous Q12H   Continuous Infusions:  cefTRIAXone (ROCEPHIN)  IV 2 g (05/25/22 3536)   vancomycin 1,000 mg (05/25/22 0513)    Diet Order             Diet regular Room service appropriate? Yes; Fluid consistency: Thin  Diet effective now                  Unresulted Labs (From admission, onward)     Start     Ordered   06/01/22 0500  Creatinine, serum  (enoxaparin (LOVENOX)    CrCl >/= 30 ml/min)  Weekly,   R     Comments: while on enoxaparin therapy    05/25/22 0054   05/24/22 1500  Pathologist smear review  Once,   R        05/24/22 1500   05/24/22 1500  Pathologist smear review  Once,   R        05/24/22 1500          Data Reviewed: I have personally reviewed following labs and imaging studies CBC: Recent Labs  Lab 05/23/22 2340 05/24/22 1528 05/25/22 0523  WBC 8.9 6.0 5.0  NEUTROABS 7.5 4.7  --   HGB 12.6 12.2 12.5  HCT 38.3 37.6 39.7  MCV 85.1 85.6 88.6  PLT 316 318 144   Basic Metabolic Panel: Recent Labs  Lab 05/23/22 2340 05/24/22 1528 05/25/22 0523  NA 137 138 137  K 3.8 4.1 4.1  CL 106 107 111  CO2 '22 22 22  '$ GLUCOSE 98 90 148*  BUN '9 8 8  '$ CREATININE 0.95 0.94 0.75  CALCIUM 9.1 8.7* 8.5*   GFR: Estimated Creatinine Clearance: 119 mL/min (by C-G formula based on SCr of 0.75 mg/dL). Liver Function Tests: Recent Labs  Lab 05/23/22 2340 05/24/22 1528  AST 13* 19  ALT 12 12  ALKPHOS 72 67  BILITOT 0.7 0.8  PROT 7.4 7.4  ALBUMIN 3.9 3.8   No results for input(s): "LIPASE", "AMYLASE" in the last 168 hours. No results for input(s): "AMMONIA" in the last 168 hours. Coagulation Profile: No results for input(s): "INR", "PROTIME" in the last 168 hours. BNP (last 3 results) No results for input(s): "PROBNP" in the last 8760 hours. HbA1C: No results for input(s): "HGBA1C" in the last 72 hours. CBG: No results for input(s): "GLUCAP" in the last 168 hours. Lipid Profile: No results for input(s): "CHOL", "HDL", "LDLCALC",  "TRIG", "CHOLHDL", "LDLDIRECT" in the last 72 hours. Thyroid Function Tests: No results for input(s): "TSH", "T4TOTAL", "FREET4", "T3FREE", "THYROIDAB" in the last 72 hours. Sepsis Labs: Recent Labs  Lab 05/23/22 2340 05/24/22 1528  LATICACIDVEN 1.5 1.5    Recent Results (from the past 240 hour(s))  SARS Coronavirus 2 by RT PCR (hospital order, performed in [facility] hospital lab) *cepheid single result test* Anterior Nasal Swab     Status: None   Collection Time: 05/24/22 12:05 AM   Specimen: Anterior Nasal Swab  Result Value Ref Range Status   SARS Coronavirus 2 by RT PCR NEGATIVE NEGATIVE Final  Comment: (NOTE) SARS-CoV-2 target nucleic acids are NOT DETECTED.  The SARS-CoV-2 RNA is generally detectable in upper and lower respiratory specimens during the acute phase of infection. The lowest concentration of SARS-CoV-2 viral copies this assay can detect is 250 copies / mL. A negative result does not preclude SARS-CoV-2 infection and should not be used as the sole basis for treatment or other patient management decisions.  A negative result may occur with improper specimen collection / handling, submission of specimen other than nasopharyngeal swab, presence of viral mutation(s) within the areas targeted by this assay, and inadequate number of viral copies (<250 copies / mL). A negative result must be combined with clinical observations, patient history, and epidemiological information.  Fact Sheet for Patients:   https://www.patel.info/  Fact Sheet for Healthcare Providers: https://hall.com/  This test is not yet approved or  cleared by the [place] FDA and has been authorized for detection and/or diagnosis of SARS-CoV-2 by FDA under an Emergency Use Authorization (EUA).  This EUA will remain in effect (meaning this test can be used) for the duration of the COVID-19 declaration under Section 564(b)(1) of the Act, 21  U.S.C. section 360bbb-3(b)(1), unless the authorization is terminated or revoked sooner.  Performed at [facility], [address] [place], [place] [address]   Blood culture (routine x 2)     Status: None (Preliminary result)   Collection Time: 05/24/22  3:34 PM   Specimen: BLOOD RIGHT FOREARM  Result Value Ref Range Status   Specimen Description   Final    BLOOD RIGHT FOREARM Performed at [facility], [address], [place], [place] [address]    Special Requests   Final    BOTTLES DRAWN AEROBIC AND ANAEROBIC Blood Culture adequate volume Performed at [facility], [address] [place], [place] [address]    Culture   Final    NO GROWTH < 24 HOURS Performed at [facility], [place] [address], [place], [place] [address]    Report Status PENDING  Incomplete  Blood culture (routine x 2)     Status: None (Preliminary result)   Collection Time: 05/24/22  3:38 PM   Specimen: BLOOD LEFT FOREARM  Result Value Ref Range Status   Specimen Description   Final    BLOOD LEFT FOREARM Performed at [facility], [address] [place], [place] [address]    Special Requests   Final    BOTTLES DRAWN AEROBIC AND ANAEROBIC Blood Culture adequate volume Performed at [facility], [address] [place], [place] [address]    Culture   Final    NO GROWTH < 24 HOURS Performed at [facility], [place] [address] [place], [place] [address]    Report Status PENDING  Incomplete  CSF culture     Status: None (Preliminary result)   Collection Time: 05/24/22  4:36 PM   Specimen: CSF; Cerebrospinal Fluid  Result Value Ref Range Status   Specimen Description   Final    CSF Performed at [facility] [address] [place], [place] [address]    Special Requests   Final    NONE Performed at [facility], [address] [place],  [address]    Gram Stain    Final    WBC PRESENT, PREDOMINANTLY PMN NO ORGANISMS SEEN CYTOSPIN SMEAR    Culture   Final    NO GROWTH < 24 HOURS Performed at [facility], [place]  [address], [place], [place] [address]    Report Status PENDING  Incomplete    Antimicrobials: Anti-infectives (From admission, onward)    Start     Dose/Rate Route Frequency Ordered Stop   05/25/22 0400  vancomycin (VANCOCIN) IVPB 1000 mg/200 mL premix        1,000 mg 200 mL/hr over 60 Minutes Intravenous Every 8 hours 05/24/22 1912     05/24/22 2015  vancomycin (VANCOCIN) IVPB 1000 mg/200 mL premix       See Hyperspace for full Linked Orders Report.   1,000 mg 200 mL/hr over 60 Minutes Intravenous  Once 05/24/22 1905 05/24/22 2209   05/24/22 1945  cefTRIAXone (ROCEPHIN) 2 g in sodium chloride 0.9 % 100 mL IVPB        2 g 200 mL/hr over 30 Minutes Intravenous Every 12 hours 05/24/22 1921     05/24/22 1915  vancomycin (VANCOCIN) IVPB 1000 mg/200 mL premix       See Hyperspace for full Linked Orders Report.   1,000 mg 200 mL/hr over 60 Minutes Intravenous  Once 05/24/22 1905 05/24/22 2031   05/24/22 1900  cefTRIAXone (ROCEPHIN) 2 g in sodium chloride 0.9 % 100 mL IVPB  Status:  Discontinued        2 g 200 mL/hr over 30 Minutes Intravenous  Once 05/24/22 1858 05/24/22 1921   05/24/22 1900  vancomycin (VANCOCIN) IVPB 1000 mg/200 mL premix  Status:  Discontinued        1,000 mg 200 mL/hr over 60 Minutes Intravenous  Once 05/24/22 1858 05/24/22 1917      Culture/Microbiology    Component Value Date/Time   SDES  05/24/2022 1636    CSF Performed at [facility] [address] [place], [place] [address]    [facility]  05/24/2022 1636    NONE Performed at [facility], [address], [place], [place] [address]    CULT  05/24/2022 1636    NO GROWTH < 24 HOURS Performed at [facility] [address], [place], [place] [address]    REPTSTATUS PENDING 05/24/2022 1636  Other culture-see  note  Radiology Studies: CT Head Wo Contrast  Result Date: 05/24/2022 CLINICAL DATA:  Headache, sudden and severe. EXAM: CT HEAD WITHOUT CONTRAST TECHNIQUE: Contiguous axial images were obtained from the base of the skull through the vertex without intravenous contrast. RADIATION DOSE REDUCTION: This exam was performed according to the departmental dose-optimization program which includes automated exposure control, adjustment of the mA and/or kV according to patient size and/or use of iterative reconstruction technique. COMPARISON:  None Available. FINDINGS: Brain: No evidence of acute infarction, hemorrhage, hydrocephalus, extra-axial collection or mass lesion/mass effect. Vascular: No hyperdense vessel or unexpected calcification. Skull: Normal. Negative for fracture or focal lesion. Sinuses/Orbits: No acute finding. Other: None. IMPRESSION: No acute intracranial abnormality. Electronically Signed   By: Keane Police D.O.   On: 05/24/2022 16:05     LOS: 0 days   Antonieta Pert, MD [facility]  05/25/2022, 11:13 AM

## 2022-05-25 NOTE — H&P (Signed)
History and Physical   Tasha Hansen YTK:[id number] DOB: 1986/05/04 DOA: 05/24/2022  PCP: Marda Stalker, PA-C   Patient coming from: Home  Chief Complaint: Headache  HPI: Tasha Hansen is a 36 y.o. female with medical history significant of meningitis presenting with ongoing headache.  Patient presenting with headache for the past day started the night before presenting and with associated neck pain that both worsen the day of presentation.  Reports associated photophobia as well as nausea but no vomiting.  Also reports fever at home of 102.  This is in the setting of being treated for UTI for which she was seen on 9/19.  Started on antibiotics about time, initially Bactrim, with plan to switch to Cipro earlier today.  She has a few gets concerned about her headache and neck stiffness as it is similar to her previous episodes of meningitis.  She denies chills, chest pain, shortness of breath, abdominal pain, constipation, diarrhea.  ED Course: Vital signs in the ED significant for fever 100.8, rate in the 90s to 100s.  Lab work-up included CMP with calcium 8.7.  CBC within normal limits.  Lactic acid normal.  COVID test negative.  Blood cultures pending.  CSF studies from lumbar puncture in ED showed elevated WBCs at 93 and 2 1 and 117 and 2 4 and elevated neutrophils in both tubes.  Early CSF culture showing white blood cells but no organisms.  Glucose and protein are pending.  Meningitis bio fire panel is negative.  Pathologist smear review is pending.  CT head showed no acute normality.  Patient received Decadron, vancomycin, ceftriaxone in the ED.  Also received Reglan, Zofran, Tylenol, fentanyl as well as a liter of fluids.  Patient being admitted for rule out of meningitis.  Review of Systems: As per HPI otherwise all other systems reviewed and are negative.  Past Medical History:  Diagnosis Date   Allergy    Anemia    GERD (gastroesophageal reflux disease)    Headache     "monthly" (08/27/2018)   Meningitis 2013   Migraine    "a few times/year" (08/27/2018)    Past Surgical History:  Procedure Laterality Date   CESAREAN SECTION N/A 06/27/2020   Procedure: CESAREAN SECTION;  Surgeon: Aloha Gell, MD;  Location: [facility];  Service: Obstetrics;  Laterality: N/A;  Breech Presentation   [place]   as an infant   WISDOM TOOTH EXTRACTION  age 64    Social History  reports that she has never smoked. She has never used smokeless tobacco. She reports that she does not drink alcohol and does not use drugs.  Allergies  Allergen Reactions   Diphenhydramine Hcl Itching   Oseltamivir Phosphate Itching    Family History  Problem Relation Age of Onset   Hypertension Father    Hypertension Maternal Grandmother    Diabetes Maternal Grandmother    Hypertension Maternal Grandfather    Diabetes Maternal Grandfather    Hypertension Paternal Grandmother    Diabetes Paternal Grandmother    Diabetes Paternal Grandfather    Hypertension Paternal Grandfather   Reviewed on admission  Prior to Admission medications   Medication Sig Start Date End Date Taking? Authorizing Provider  acetaminophen (TYLENOL) 500 MG tablet Take 2 tablets (1,000 mg total) by mouth every 6 (six) hours as needed for mild pain. 06/30/20   Juliene Pina, CNM  cetirizine (ZYRTEC ALLERGY) 10 MG tablet Take 1 tablet (10 mg total) by mouth daily. 11/21/20  Jaynee Eagles, PA-C  ciprofloxacin (CIPRO) 500 MG tablet Take 1 tablet (500 mg total) by mouth every 12 (twelve) hours. 05/24/22   Horton, Barbette Hair, MD  dextromethorphan-guaiFENesin (MUCINEX DM) 30-600 MG 12hr tablet Take 1 tablet by mouth 2 (two) times daily.    [provider]  fluticasone (FLONASE) 50 MCG/ACT nasal spray Place 1 spray into both nostrils daily. 12/01/20   Volney American, PA-C  ibuprofen (ADVIL) 800 MG tablet Take 1 tablet (800 mg total) by mouth every 8 (eight) hours. 06/30/20   Juliene Pina, CNM  JUNEL FE 1/20 1-20 MG-MCG tablet Take 1 tablet by mouth daily. 11/05/20   [provider]  montelukast (SINGULAIR) 10 MG tablet Take 1 tablet (10 mg total) by mouth at bedtime. 12/01/20   Volney American, PA-C  Multiple Vitamins-Minerals (MULTIVITAMIN PO) Take by mouth.    [provider]  oxyCODONE (OXY IR/ROXICODONE) 5 MG immediate release tablet Take 1-2 tablets (5-10 mg total) by mouth every 4 (four) hours as needed for moderate pain. 06/30/20   Juliene Pina, CNM  predniSONE (DELTASONE) 20 MG tablet Take 2 tablets (40 mg total) by mouth daily with breakfast. 12/01/20   Volney American, PA-C  pseudoephedrine (SUDAFED) 60 MG tablet Take 1 tablet (60 mg total) by mouth every 8 (eight) hours as needed for congestion. 11/21/20   Jaynee Eagles, PA-C  Semaglutide-Weight Management (WEGOVY) 0.25 MG/0.5ML SOAJ Inject 0.5 mL every week by subcutaneous route as directed for 28 days. 04/17/22     Semaglutide-Weight Management (WEGOVY) 0.5 MG/0.5ML SOAJ Inject 0.5 mg under the skin every week. 05/23/22     Semaglutide-Weight Management (WEGOVY) 1 MG/0.5ML SOAJ Inject 1 mg under the skin every week. 05/23/22     Semaglutide-Weight Management (WEGOVY) 1.7 MG/0.75ML SOAJ Inject 1.7 milligrams under the skin every week. 05/23/22     senna-docusate (SENOKOT-S) 8.6-50 MG tablet Take 2 tablets by mouth daily. 07/01/20   Juliene Pina, CNM  simethicone (MYLICON) 80 MG chewable tablet Chew 1 tablet (80 mg total) by mouth as needed for flatulence. 06/30/20   Juliene Pina, CNM  sulfamethoxazole-trimethoprim (BACTRIM DS) 800-160 MG tablet Take 1 tablet by mouth 2 (two) times daily. 05/23/22   [provider]  tobramycin (TOBREX) 0.3 % ophthalmic ointment Place 1 application into the left eye 4 (four) times daily. 11/21/20   Jaynee Eagles, PA-C    Physical Exam: Vitals:   05/24/22 2225 05/24/22 2251 05/24/22 2330 05/25/22 0034  BP:  125/85 101/72 124/85  Pulse: (!)  107 (!) 107 (!) 109 (!) 110  Resp:  '18 18 17  '$ Temp:  (!) 100.4 F (38 C)  99 F (37.2 C)  TempSrc:  Oral    SpO2: 97% 97% 95% 98%  Weight:      Height:        Physical Exam Constitutional:      General: She is not in acute distress.    Appearance: Normal appearance. She is obese.  HENT:     Head: Normocephalic and atraumatic.     Mouth/Throat:     Mouth: Mucous membranes are moist.     Pharynx: Oropharynx is clear.  Eyes:     Extraocular Movements: Extraocular movements intact.     Pupils: Pupils are equal, round, and reactive to light.  Cardiovascular:     Rate and Rhythm: Regular rhythm. Tachycardia present.     Pulses: Normal pulses.     Heart sounds: Normal heart sounds.  Pulmonary:     Effort: Pulmonary effort is normal. No respiratory distress.     Breath sounds: Normal breath sounds.  Abdominal:     General: Bowel sounds are normal. There is no distension.     Palpations: Abdomen is soft.     Tenderness: There is no abdominal tenderness.  Musculoskeletal:        General: No swelling or deformity.     Cervical back: No rigidity.  Skin:    General: Skin is warm and dry.  Neurological:     General: No focal deficit present.     Mental Status: Mental status is at baseline.    Labs on Admission: I have personally reviewed following labs and imaging studies  CBC: Recent Labs  Lab 05/23/22 2340 05/24/22 1528  WBC 8.9 6.0  NEUTROABS 7.5 4.7  HGB 12.6 12.2  HCT 38.3 37.6  MCV 85.1 85.6  PLT 316 619    Basic Metabolic Panel: Recent Labs  Lab 05/23/22 2340 05/24/22 1528  NA 137 138  K 3.8 4.1  CL 106 107  CO2 22 22  GLUCOSE 98 90  BUN 9 8  CREATININE 0.95 0.94  CALCIUM 9.1 8.7*    GFR: Estimated Creatinine Clearance: 101.3 mL/min (by C-G formula based on SCr of 0.94 mg/dL).  Liver Function Tests: Recent Labs  Lab 05/23/22 2340 05/24/22 1528  AST 13* 19  ALT 12 12  ALKPHOS 72 67  BILITOT 0.7 0.8  PROT 7.4 7.4  ALBUMIN 3.9 3.8     Urine analysis:    Component Value Date/Time   COLORURINE YELLOW 05/23/2022 2349   APPEARANCEUR HAZY (A) 05/23/2022 2349   LABSPEC 1.027 05/23/2022 2349   PHURINE 6.5 05/23/2022 2349   GLUCOSEU NEGATIVE 05/23/2022 2349   HGBUR LARGE (A) 05/23/2022 2349   BILIRUBINUR SMALL (A) 05/23/2022 2349   BILIRUBINUR n 12/14/2014 1054   KETONESUR NEGATIVE 05/23/2022 2349   PROTEINUR 30 (A) 05/23/2022 2349   UROBILINOGEN negative 12/14/2014 1054   UROBILINOGEN 0.2 11/29/2011 1213   NITRITE POSITIVE (A) 05/23/2022 2349   LEUKOCYTESUR MODERATE (A) 05/23/2022 2349    Radiological Exams on Admission: CT Head Wo Contrast  Result Date: 05/24/2022 CLINICAL DATA:  Headache, sudden and severe. EXAM: CT HEAD WITHOUT CONTRAST TECHNIQUE: Contiguous axial images were obtained from the base of the skull through the vertex without intravenous contrast. RADIATION DOSE REDUCTION: This exam was performed according to the departmental dose-optimization program which includes automated exposure control, adjustment of the mA and/or kV according to patient size and/or use of iterative reconstruction technique. COMPARISON:  None Available. FINDINGS: Brain: No evidence of acute infarction, hemorrhage, hydrocephalus, extra-axial collection or mass lesion/mass effect. Vascular: No hyperdense vessel or unexpected calcification. Skull: Normal. Negative for fracture or focal lesion. Sinuses/Orbits: No acute finding. Other: None. IMPRESSION: No acute intracranial abnormality. Electronically Signed   By: Keane Police D.O.   On: 05/24/2022 16:05    EKG: Not performed in the emergency department.  Last EKG was 2 days ago and this showed sinus tachycardia 110 bpm.  With nonspecific T wave flattening.  QTc stable at 448.  Assessment/Plan Principal Problem:   Headache Active Problems:   UTI (urinary tract infection)   Headache Rule out meningitis > Patient presenting with significant headache and associated neck stiffness  with patient concerned for recurrent meningitis.  She has had this twice in the past with similar symptoms. > She does have an associated fever however this is in the setting of known UTI  with suspected pyelonephritis for which she is receiving antibiotics for the past 2 to 3 days. > Her antibiotic use does potentially complicated the interpretation of her CSF results as well.  Lumbar puncture obtained in the ED showed elevated white blood cells primarily neutrophils.  Culture showing white blood cells but no organisms.  Glucose and protein pending.  Meningitis bio fire panel negative. > Patient received Decadron, vancomycin, ceftriaxone in ED.  CT head negative.  Most suspicious for viral or aseptic meningitis at this time.  No systemic leukocytosis.  Pain improved when I saw her. - Monitor on telemetry - Continue with ceftriaxone and vancomycin overnight - Consider discussing with ID in the morning with her proceeding p.o. antibiotic use - Follow-up CSF studies and blood cultures - Trend fever curve and WBC  UTI Suspected pyelonephrosis > Patient was being treated for UTI outpatient with Bactrim. > Patient started on vancomycin and ceftriaxone as part of ruling out meningitis as above. - Continue with ceftriaxone - Trend fever curve and WBC  DVT prophylaxis: Lovenox Code Status:   Full Family Communication:  None on admission  Disposition Plan:   Patient is from:  Home  Anticipated DC to:  Home  Anticipated DC date:  1 to 2 days  Anticipated DC barriers: None  Consults called:  None Admission status:  Observation, MedSurg  Severity of Illness: The appropriate patient status for this patient is OBSERVATION. Observation status is judged to be reasonable and necessary in order to provide the required intensity of service to ensure the patient's safety. The patient's presenting symptoms, physical exam findings, and initial radiographic and laboratory data in the context of their medical  condition is felt to place them at decreased risk for further clinical deterioration. Furthermore, it is anticipated that the patient will be medically stable for discharge from the hospital within 2 midnights of admission.    Marcelyn Bruins MD [facility]  How to contact the [facility] Attending or Consulting provider [place] or covering provider during after hours [place], for this patient?   Check the care team in [facility] and look for a) attending/consulting [facility] provider listed and b) the [facility] team listed Log into [email or web address] and use [place]'s universal password to access. If you do not have the password, please contact the hospital operator. Locate the [facility] provider you are looking for under [facility] and page to a number that you can be directly reached. If you still have difficulty reaching the provider, please page the [facility] (Director on Call) for the Hospitalists listed on amion for assistance.  05/25/2022, 1:17 AM

## 2022-05-25 NOTE — Progress Notes (Signed)
Patient stated to this RN "I want to refuse brain MRI I don't think I need it". Dr. Maren Beach notified and Dr. Maren Beach went to speak with [facility] and pt continued to refuse MRI of brain.

## 2022-05-25 NOTE — Consult Note (Addendum)
[facility] Infectious Diseases                                                                                        Patient Identification: Patient Name: Tasha Hansen MRN: [id number] [place] Date: 05/24/2022  2:32 PM Today's Date: 05/25/2022 Reason for consult: Concern for meningitis Requesting provider: Antonieta Pert  Principal Problem:   Headache Active Problems:   UTI (urinary tract infection)   Antibiotics:  Ceftriaxone 9/19- Vancomycin 9/20-  Lines/Hardware: PIVs  Assessment 36 year old female with PMH of anemia, GERD, brain, prior to episodes of aseptic meningitis x2 in 2013 as well as 2019 admitted with   # Possible aseptic meningitis  - in the setting of Bactrim use, no NSAID use - All her initial symptoms on presentation has resolved and she is almost back to baseline. Clinically does not appear to be bacterial meningitis with normal glucose and mildly elevated protein and CSF glucose/serum glucose ratio approx 0.6 - HIV NR  # Possible UTI   Recommendations  Will continue current abtx regimen until tomorrow and plan for de-escalation pending CSF cx Recommend Neurology consult given concerns for  non infective causes of recurrent meningitis and need for further work up. Defer to Neurology and Primary  Will add HCV and RPR   Will re-evaluate tomorrow   Rest of the management as per the primary team. Please call with questions or concerns.  Thank you for the consult  Rosiland Oz, MD Infectious Disease Physician [facility] [address] [place], [place] [address] Phone: [phone]  Fax: [phone]  __________________________________________________________________________________________________________ HPI and Hospital Course: 36 year old female with PMH of anemia, GERD, brain, prior to episodes of aseptic meningitis x2 in  2013 as well as 2019 who initially presented to the ED on 9/19 with dysuria, chills and nausea.  She was recently treated with a 7 days course of Cephalexin for UTI in early September where she had improvement in symptoms however over the last several days she had recurrence of dysuria frequency and urgency.  She saw her OB/GYN one day before ED visit and was started on Bactrim which she took for 2 doses but started started having chills and fevers up to 101. She was seen in the ED and  discharged  on ciprofloxacin and took one dose However, came  back with headache, neck pain, photophobia, nausea and fevers.  AOx4 and ambulatory at ED. She feels this is similar to her prior episodes of meningitis.   Patient accompanied by her mother.  She tells me that previously reported headache, neck pain, photophobia and nausea all have resolved.  Denies any cough, chest pain or shortness of breath.  Denies any any vomiting abdominal pain or diarrhea.  Prior reported GU symptoms have resolved.  Denies any vaginal discharge or pain.  Denies any new rashes or joint pain.  Denies any congestion or URTI symptom.  denies any recent travel or sick contacts.  She is taking OCPs and last menstrual cycle was in June.  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  Mother states she  is up-to-date on vaccinations.  Denies any prior HSV infection or active lesions currently.  Denies any family history of autoimmune diseases.  No new medication except 2 doses of Bactrim and 1 dose of ciprofloxacin. Denies  NSAID use   ROS: all systems reviewed with pertinent positives and negatives as listed above   Past Medical History:  Diagnosis Date   Allergy    Anemia    GERD (gastroesophageal reflux disease)    Headache    "monthly" (08/27/2018)   Meningitis 2013   Migraine    "a few times/year" (08/27/2018)   Past Surgical History:  Procedure Laterality Date   CESAREAN SECTION N/A 06/27/2020   Procedure: CESAREAN  SECTION;  Surgeon: Aloha Gell, MD;  Location: [facility] LD ORS;  Service: Obstetrics;  Laterality: N/A;  Breech Presentation   [place]   as an infant   WISDOM TOOTH EXTRACTION  age 51     Scheduled Meds:  enoxaparin (LOVENOX) injection  40 mg Subcutaneous Q24H   sodium chloride flush  3 mL Intravenous Q12H   Continuous Infusions:  cefTRIAXone (ROCEPHIN)  IV 2 g (05/25/22 0921)   vancomycin 1,000 mg (05/25/22 0513)   PRN Meds:.acetaminophen **OR** acetaminophen, polyethylene glycol  Allergies  Allergen Reactions   Diphenhydramine Hcl Hives and Itching   Oseltamivir Phosphate Hives and Itching   Social History   Socioeconomic History   Marital status: Married    Spouse name: Not on file   Number of children: Not on file   Years of education: Not on file   Highest education level: Not on file  Occupational History   Not on file  Tobacco Use   Smoking status: Never   Smokeless tobacco: Never  Vaping Use   Vaping Use: Never used  Substance and Sexual Activity   Alcohol use: Never    Alcohol/week: 0.0 standard drinks of alcohol   Drug use: Never   Sexual activity: Not on file  Other Topics Concern   Not on file  Social History Narrative   College degree   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   Financial Resource Strain: Not on file  Food Insecurity: Not on file  Transportation Needs: Not on file  Physical Activity: Not on file  Stress: Not on file  Social Connections: Not on file  Intimate Partner Violence: Not on file   Family History  Problem Relation Age of Onset   Hypertension Father    Hypertension Maternal Grandmother    Diabetes Maternal Grandmother    Hypertension Maternal Grandfather    Diabetes Maternal Grandfather    Hypertension Paternal Grandmother    Diabetes Paternal Grandmother    Diabetes Paternal Grandfather    Hypertension Paternal Grandfather      Vitals BP 107/68 (BP Location: Left  Arm)   Pulse (!) 103   Temp 98.5 F (36.9 C) (Oral)   Resp 19   Ht '5\' 5"'$  (1.651 m)   Wt 106.6 kg   LMP 02/14/2022 (Approximate) Comment: takes birth control pills and skips last week and starts new pack  SpO2 100%   BMI 39.11 kg/m    Physical Exam Constitutional: Sitting up in the bed and appears comfortable    Comments:   Cardiovascular:     Rate and Rhythm: Normal rate and regular rhythm.     Heart sounds:  Pulmonary:     Effort: Pulmonary effort is normal on room  air    Comments: Normal breath sounds  Abdominal:     Palpations: Abdomen is soft.     Tenderness: Nontender and nondistended  Musculoskeletal:        General: No swelling or tenderness in peripheral joints  Skin:    Comments: No lesions or rashes  Neurological:     General: No focal deficit present.  Awake alert and oriented.  Sensation  and power B/l intact, CN 2-12 intact. No neck rigidity  Psychiatric:        Mood and Affect: Mood normal.    Pertinent Microbiology Results for orders placed or performed during the hospital encounter of 05/24/22  Blood culture (routine x 2)     Status: None (Preliminary result)   Collection Time: 05/24/22  3:34 PM   Specimen: BLOOD RIGHT FOREARM  Result Value Ref Range Status   Specimen Description   Final    BLOOD RIGHT FOREARM Performed at [facility], [address], [place], [place] [address]    Special Requests   Final    BOTTLES DRAWN AEROBIC AND ANAEROBIC Blood Culture adequate volume Performed at [facility], [address], [place], [place] [address]    Culture   Final    NO GROWTH < 24 HOURS Performed at [facility], [place] [address] [place], [place] [address]    Report Status PENDING  Incomplete  Blood culture (routine x 2)     Status: None (Preliminary result)   Collection Time: 05/24/22  3:38 PM   Specimen: BLOOD LEFT FOREARM  Result Value Ref Range Status   Specimen Description   Final     BLOOD LEFT FOREARM Performed at [facility], [address], [place], [place] [address]    Special Requests   Final    BOTTLES DRAWN AEROBIC AND ANAEROBIC Blood Culture adequate volume Performed at [facility], [address] [place], [place] [address]    Culture   Final    NO GROWTH < 24 HOURS Performed at [facility], [place] [address], [place], [place] [address]    Report Status PENDING  Incomplete  CSF culture     Status: None (Preliminary result)   Collection Time: 05/24/22  4:36 PM   Specimen: CSF; Cerebrospinal Fluid  Result Value Ref Range Status   Specimen Description   Final    CSF Performed at [facility] [address], [place], [place] [address]    Special Requests   Final    NONE Performed at [facility], [address] [place], [place] [address]    Gram Stain   Final    WBC PRESENT, PREDOMINANTLY PMN NO ORGANISMS SEEN CYTOSPIN SMEAR Performed at [facility], [place] [address] [place],  [address]    Culture PENDING  Incomplete   Report Status PENDING  Incomplete   Pertinent Lab seen by me:    Latest Ref Rng & Units 05/25/2022    5:23 AM 05/24/2022    3:28 PM 05/23/2022   11:40 PM  CBC  WBC 4.0 - 10.5 K/uL 5.0  6.0  8.9   Hemoglobin 12.0 - 15.0 g/dL 12.5  12.2  12.6   Hematocrit 36.0 - 46.0 % 39.7  37.6  38.3   Platelets 150 - 400 K/uL 300  318  316       Latest Ref Rng & Units 05/25/2022    5:23 AM 05/24/2022    3:28 PM 05/23/2022  11:40 PM  CMP  Glucose 70 - 99 mg/dL 148  90  98   BUN 6 - 20 mg/dL '8  8  9   '$ Creatinine 0.44 - 1.00 mg/dL 0.75  0.94  0.95   Sodium 135 - 145 mmol/L 137  138  137   Potassium 3.5 - 5.1 mmol/L 4.1  4.1  3.8   Chloride 98 - 111 mmol/L 111  107  106   CO2 22 - 32 mmol/L '22  22  22   '$ Calcium 8.9 - 10.3 mg/dL 8.5  8.7  9.1   Total Protein 6.5 - 8.1 g/dL  7.4  7.4   Total Bilirubin 0.3 - 1.2 mg/dL  0.8  0.7   Alkaline Phos 38 - 126 U/L   67  72   AST 15 - 41 U/L  19  13   ALT 0 - 44 U/L  12  12      Pertinent Imagings/Other Imagings Plain films and CT images have been personally visualized and interpreted; radiology reports have been reviewed. Decision making incorporated into the Impression / Recommendations.  CT Head Wo Contrast  Result Date: 05/24/2022 CLINICAL DATA:  Headache, sudden and severe. EXAM: CT HEAD WITHOUT CONTRAST TECHNIQUE: Contiguous axial images were obtained from the base of the skull through the vertex without intravenous contrast. RADIATION DOSE REDUCTION: This exam was performed according to the departmental dose-optimization program which includes automated exposure control, adjustment of the mA and/or kV according to patient size and/or use of iterative reconstruction technique. COMPARISON:  None Available. FINDINGS: Brain: No evidence of acute infarction, hemorrhage, hydrocephalus, extra-axial collection or mass lesion/mass effect. Vascular: No hyperdense vessel or unexpected calcification. Skull: Normal. Negative for fracture or focal lesion. Sinuses/Orbits: No acute finding. Other: None. IMPRESSION: No acute intracranial abnormality. Electronically Signed   By: Keane Police D.O.   On: 05/24/2022 16:05     I spent 100  minutes for this patient encounter including review of prior medical records/discussing diagnostics and treatment plan with the patient/family/coordinate care with primary/other specialits with greater than 50% of time in face to face encounter.   Electronically signed by:   Rosiland Oz, MD Infectious Disease Physician [facility] for Infectious Disease Pager: [phone]

## 2022-05-25 NOTE — Hospital Course (Addendum)
73 old female with history significant for meningitis, anemia allergy GERD, migrainewho was treated for UTI earlier this month and then started Bactrim 1 DAY pta for recurrent sxs, returned to ED early with ongoing urinary symptoms and chills and was switched to ciprofloxacin, and 9/21 returned again with headache and neck pain  with associated neck pain, photophobia nausea also reported fever of 102 at home.In the ED Tmax 100.8, rest of the labs fairly stable CBC lactic acid COVID-19 negative.  Blood culture sent, negative head CT. She had LP CS: WBC 93, 117, RBC 28, 35, neutrophils 61, 65 lymphocytes 15, 21 total protein 58, clear Gram stain/culture unremarkable, ID stopping antibiotics and monitor 24 hours.  So far work-up with HSV panel negative.Previously ANA and Anti Ro Ab + in 2020- here ana + again- she was referred to Rheum in 2020, but she did not  follow-up w/ rheumatology and-when I talked about this she is agreeable to follow-up now.Discussed with Dr. Benjamine Mola from rheumatology, seen by ID, discussed with neurology.  Underwent CT chest no sarcoidosis, MRI brain with and without no acute intracranial finding.  ACE level normal range factor positive.  Outpatient referral made to rheumatology clinic and also number provided to the patient so she can call for follow-up.  At this time she feels well, she is requesting to be discharged today, mother at the bedside requesting the same.  Discussed with ID Dr Baxter Flattery and okay for dc since she did well w/o antibiotics

## 2022-05-25 NOTE — Plan of Care (Addendum)
Discussed briefly with Dr. Lupita Leash and chart briefly reviewed.  36 year old woman presenting with third episode of aseptic meningitis currently back to her baseline.   Latest Reference Range & Units 11/27/11 14:02 08/27/18 11:39 05/24/22 15:00  Appearance, CSF CLEAR  CLEAR  CLEAR CLEAR HAZY ! HAZY ! CLEAR CLEAR  Glucose, CSF 40 - 70 mg/dL 51 65 56  RBC Count, CSF 0 /cu mm 0 /cu mm 2 (H) 22 (H) 2 (H) 113 (H) 28 (H) 335 (H)  WBC, CSF 0 - 5 /cu mm 0 - 5 /cu mm 148 (HH) 107 (HH) 363 (HH) 81 (HH) 93 (HH) 117 (HH)  Segmented Neutrophils-CSF 0 - 6 % 0 - 6 % 32 (H) 20 (H) 96 (H) 96 (H) 61 (H) 65 (H)  Lymphs, CSF 40 - 80 % 40 - 80 % 55 62 4 (L) 4 (L) 15 (L) 12 (L)  Monocyte-Macrophage-Spinal Fluid 15 - 45 % 15 - 45 % 13 (L) '18  23 23  '$ Eosinophils, CSF 0 - 1 % 0 - 1 % 0 0  0 0  Other Cells, CSF    BASOPHILS 1 (C)  Color, CSF COLORLESS  COLORLESS  COLORLESS COLORLESS COLORLESS COLORLESS COLORLESS COLORLESS  Supernatant  NOT INDICATED NOT INDICATED NOT INDICATED NOT INDICATED NOT INDICATED NOT INDICATED  Total  Protein, CSF 15 - 45 mg/dL 26 54 (H) 58 (H)  Tube #  '4 1 4 1 1 4  '$ (HH): Data is critically high !: Data is abnormal (H): Data is abnormally high (L): Data is abnormally low (C): Corrected  Seen by infectious disease in 2013 for follow-up and allergy and immunology in 2020 for follow-up without etiology determined  Recommend outpatient follow-up with neurology, given she is back at her baseline and episodes are spaced years apart I feel this is a reasonable plan Avoid medications associated with aseptic meningitis (ibuprofen and other NSAIDs, pre-DM, trimethoprim/sulfamethoxazole, azathioprine) CT chest with contrast to evaluate for possible sarcoidosis Repeat ANA (has been negative in the past) MRI brain w/ and w/o contrast Added on VZV and HSV IgG to CSF if additional CSF is available (but given history and time course of improvement highly doubt infectious  etiology and feel comfortable with negative PCR on biofire panel) If full neurological consultation is needed due to ongoing symptoms, concerning MRI brain findings, or acute neurological concerns arise, please do reach out to neuro hospitalist on at [place] [phone]

## 2022-05-26 ENCOUNTER — Observation Stay (HOSPITAL_COMMUNITY): Payer: 59

## 2022-05-26 DIAGNOSIS — G039 Meningitis, unspecified: Secondary | ICD-10-CM

## 2022-05-26 DIAGNOSIS — Z0389 Encounter for observation for other suspected diseases and conditions ruled out: Secondary | ICD-10-CM | POA: Diagnosis not present

## 2022-05-26 DIAGNOSIS — N390 Urinary tract infection, site not specified: Secondary | ICD-10-CM | POA: Diagnosis not present

## 2022-05-26 DIAGNOSIS — R519 Headache, unspecified: Secondary | ICD-10-CM | POA: Diagnosis not present

## 2022-05-26 DIAGNOSIS — Z79899 Other long term (current) drug therapy: Secondary | ICD-10-CM | POA: Diagnosis not present

## 2022-05-26 DIAGNOSIS — R079 Chest pain, unspecified: Secondary | ICD-10-CM | POA: Diagnosis not present

## 2022-05-26 LAB — PATHOLOGIST SMEAR REVIEW

## 2022-05-26 LAB — ANA: Anti Nuclear Antibody (ANA): POSITIVE — AB

## 2022-05-26 MED ORDER — GADOBUTROL 1 MMOL/ML IV SOLN
10.0000 mL | Freq: Once | INTRAVENOUS | Status: AC | PRN
  Administered 2022-05-26: 10 mL via INTRAVENOUS

## 2022-05-26 MED ORDER — SODIUM CHLORIDE 0.9 % IV SOLN: INTRAVENOUS | Status: DC

## 2022-05-26 MED ORDER — SODIUM CHLORIDE (PF) 0.9 % IJ SOLN
INTRAMUSCULAR | Status: AC
  Filled 2022-05-26: qty 50

## 2022-05-26 MED ORDER — IOHEXOL 300 MG/ML  SOLN
75.0000 mL | Freq: Once | INTRAMUSCULAR | Status: AC | PRN
  Administered 2022-05-26: 75 mL via INTRAVENOUS

## 2022-05-26 NOTE — Progress Notes (Addendum)
[facility] Infectious Diseases Follow Up Note  Patient Identification: Patient Name: Tasha Hansen MRN: [id number] [place] Date: 05/24/2022  2:32 PM Age: 36 y.o.Today's Date: 05/26/2022  Reason for Visit: Concern for meningitis  Principal Problem:   Headache Active Problems:   UTI (urinary tract infection)   Meningitis  Antibiotics:  Ceftriaxone 9/19- Vancomycin 9/20-   Lines/Hardware: PIVs  Interval Events: Remains afebrile, symptoms are completely resolved.  Autoimmune labs sent. plan for MRI brain and CT chest today after discussion with neurology  Assessment 36 year old female with PMH of anemia, GERD, brain, prior to episodes of aseptic meningitis x2 in 2013 as well as 2019 admitted with    #Possible aseptic meningitis -In the setting of Bactrim use, no known NSAID use -All her symptoms are completely resolved and she is back to baseline -HIV nonreactive -9/20 ME panel negative, CSF gram stain and cx negative in 2 days, blood cx negative in 2 days  -Discussed with neurology as well as rheumatology regarding noninfective causes for recurrent meningitis. Of note, ANA and anti SSA antibody positive in 10/2018 - Discussed to avoid bactrim and NSAIDs  # Possible UTI  -Her symptoms are resolved -She has completed adequate course of treatment with ceftriaxone this morning   Recommendations DC antibiotics as unlikely to this to be a bacterial meningitis Dr. Lupita Leash has ordered MRI brain as well as CT chest ( both unremarkable) after discussion with neurology as well as autoimmune labs after discussion with rheumatology with fu OP with Rheumatology  for causes of recurrent meningitis.  Monitor off antibiotics for 24 hrs prior to DC RPR and HCV ab pending, call us back if positive  ID available as needed. Please recall if needed.  D/w Dr Lupita Leash   Rest of the management as per the primary team. Thank you for the consult. Please  page with pertinent questions or concerns.  ______________________________________________________________________ Subjective patient seen and examined at the bedside.  Mother at the bedside No symptoms whatsoever She is willing to get MRI brain and CT chest She is eager to go home  Vitals BP 119/87 (BP Location: Left Arm)   Pulse 75   Temp 98.2 F (36.8 C) (Oral)   Resp 18   Ht '5\' 5"'$  (1.651 m)   Wt 106.6 kg   LMP 02/14/2022 (Approximate) Comment: takes birth control pills and skips last week and starts new pack  SpO2 100%   BMI 39.11 kg/m     Physical Exam Constitutional: Sitting up in the bed, appears comfortable    Comments:   Cardiovascular:     Rate and Rhythm: Normal rate and regular rhythm.     Heart sounds:   Pulmonary:     Effort: Pulmonary effort is normal.     Comments: Normal breath sounds  Abdominal:     Palpations: Abdomen is soft.     Tenderness: Nondistended  Musculoskeletal:        General: No swelling or tenderness in peripheral joints  Skin:    Comments:   Neurological:     General: Awake alert and oriented, grossly nonfocal  Psychiatric:        Mood and Affect: Upset that she is not able to go home today  Pertinent Microbiology Results for orders placed or performed during the hospital encounter of 05/24/22  Blood culture (routine x 2)     Status: None (Preliminary result)   Collection Time: 05/24/22  3:34 PM   Specimen: BLOOD RIGHT FOREARM  Result Value Ref Range  Status   Specimen Description   Final    BLOOD RIGHT FOREARM Performed at [facility], [address], [place], [place] [address]    Special Requests   Final    BOTTLES DRAWN AEROBIC AND ANAEROBIC Blood Culture adequate volume Performed at [facility], [address], [place], [place] [address]    Culture   Final    NO GROWTH < 24 HOURS Performed at [facility], [place] [address] [place], [place] [address]    Report Status  PENDING  Incomplete  Blood culture (routine x 2)     Status: None (Preliminary result)   Collection Time: 05/24/22  3:38 PM   Specimen: BLOOD LEFT FOREARM  Result Value Ref Range Status   Specimen Description   Final    BLOOD LEFT FOREARM Performed at [facility], [address] [place], [place] [address]    Special Requests   Final    BOTTLES DRAWN AEROBIC AND ANAEROBIC Blood Culture adequate volume Performed at [facility], [address], [place], [place] [address]    Culture   Final    NO GROWTH < 24 HOURS Performed at [facility], [place] [address], [place], [place] [address]    Report Status PENDING  Incomplete  CSF culture     Status: None (Preliminary result)   Collection Time: 05/24/22  4:36 PM   Specimen: CSF; Cerebrospinal Fluid  Result Value Ref Range Status   Specimen Description   Final    CSF Performed at [facility] [address] [place], [place] [address]    Special Requests   Final    NONE Performed at [facility], [address], [place], [place] [address]    Gram Stain   Final    WBC PRESENT, PREDOMINANTLY PMN NO ORGANISMS SEEN CYTOSPIN SMEAR    Culture   Final    NO GROWTH < 24 HOURS Performed at [facility], [place] [address], [place], [place] [address]    Report Status PENDING  Incomplete   Pertinent Lab.    Latest Ref Rng & Units 05/25/2022    5:23 AM 05/24/2022    3:28 PM 05/23/2022   11:40 PM  CBC  WBC 4.0 - 10.5 K/uL 5.0  6.0  8.9   Hemoglobin 12.0 - 15.0 g/dL 12.5  12.2  12.6   Hematocrit 36.0 - 46.0 % 39.7  37.6  38.3   Platelets 150 - 400 K/uL 300  318  316       Latest Ref Rng & Units 05/25/2022    5:23 AM 05/24/2022    3:28 PM 05/23/2022   11:40 PM  CMP  Glucose 70 - 99 mg/dL 148  90  98   BUN 6 - 20 mg/dL '8  8  9   '$ Creatinine 0.44 - 1.00 mg/dL 0.75  0.94  0.95   Sodium 135 - 145 mmol/L 137  138  137   Potassium 3.5 - 5.1 mmol/L 4.1  4.1  3.8    Chloride 98 - 111 mmol/L 111  107  106   CO2 22 - 32 mmol/L '22  22  22   '$ Calcium 8.9 - 10.3 mg/dL 8.5  8.7  9.1   Total Protein 6.5 - 8.1 g/dL  7.4  7.4   Total Bilirubin 0.3 - 1.2 mg/dL  0.8  0.7   Alkaline Phos 38 - 126 U/L  67  72   AST  15 - 41 U/L  19  13   ALT 0 - 44 U/L  12  12      Pertinent Imaging today Plain films and CT images have been personally visualized and interpreted; radiology reports have been reviewed. Decision making incorporated into the Impression / Recommendations.  MR BRAIN W WO CONTRAST  Result Date: 05/26/2022 CLINICAL DATA:  Concern for infection. EXAM: MRI HEAD WITHOUT AND WITH CONTRAST TECHNIQUE: Multiplanar, multiecho pulse sequences of the brain and surrounding structures were obtained without and with intravenous contrast. CONTRAST:  73m GADAVIST GADOBUTROL 1 MMOL/ML IV SOLN COMPARISON:  CT head 05/24/2022 FINDINGS: Brain: No acute infarction, hemorrhage, hydrocephalus, extra-axial collection or mass lesion. Vascular: Normal flow voids. Skull and upper cervical spine: Normal marrow signal. Sinuses/Orbits: Mild right maxillary sinus mucosal thickening. Other: None. IMPRESSION: No finding to suggest intracranial infection. Electronically Signed   By: HMarin RobertsM.D.   On: 05/26/2022 16:34   CT CHEST W CONTRAST  Result Date: 05/26/2022 CLINICAL DATA:  Chest pain. EXAM: CT CHEST WITH CONTRAST TECHNIQUE: Multidetector CT imaging of the chest was performed during intravenous contrast administration. RADIATION DOSE REDUCTION: This exam was performed according to the departmental dose-optimization program which includes automated exposure control, adjustment of the mA and/or kV according to patient size and/or use of iterative reconstruction technique. CONTRAST:  740mOMNIPAQUE IOHEXOL 300 MG/ML  SOLN COMPARISON:  June 22, 2004. FINDINGS: Cardiovascular: No significant vascular findings. Normal heart size. No pericardial effusion. Mediastinum/Nodes: No enlarged  mediastinal, hilar, or axillary lymph nodes. Thyroid gland, trachea, and esophagus demonstrate no significant findings. Lungs/Pleura: Lungs are clear. No pleural effusion or pneumothorax. Upper Abdomen: No acute abnormality. Musculoskeletal: No chest wall abnormality. No acute or significant osseous findings. IMPRESSION: No definite abnormality seen in the chest. Electronically Signed   By: JaMarijo Conception.D.   On: 05/26/2022 11:47   CT Head Wo Contrast  Result Date: 05/24/2022 CLINICAL DATA:  Headache, sudden and severe. EXAM: CT HEAD WITHOUT CONTRAST TECHNIQUE: Contiguous axial images were obtained from the base of the skull through the vertex without intravenous contrast. RADIATION DOSE REDUCTION: This exam was performed according to the departmental dose-optimization program which includes automated exposure control, adjustment of the mA and/or kV according to patient size and/or use of iterative reconstruction technique. COMPARISON:  None Available. FINDINGS: Brain: No evidence of acute infarction, hemorrhage, hydrocephalus, extra-axial collection or mass lesion/mass effect. Vascular: No hyperdense vessel or unexpected calcification. Skull: Normal. Negative for fracture or focal lesion. Sinuses/Orbits: No acute finding. Other: None. IMPRESSION: No acute intracranial abnormality. Electronically Signed   By: ImKeane Police.O.   On: 05/24/2022 16:05     I spent 50 minutes for this patient encounter including review of prior medical records, coordination of care with primary/other specialist with greater than 50% of time being face to face/counseling and discussing diagnostics/treatment plan with the patient/family.  Electronically signed by:   SaRosiland OzMD Infectious Disease Physician [facility] Infectious Disease Pager: [phone]

## 2022-05-26 NOTE — Plan of Care (Signed)

## 2022-05-26 NOTE — Progress Notes (Signed)
  Transition of Care (TOC) Screening Note   Patient Details  Name: Tasha Hansen Date of Birth: 1986/07/02   Transition of Care [facility]) CM/SW Contact:    Vassie Moselle, LCSW Phone Number: 05/26/2022, 12:24 PM    Transition of Care [facility]) has reviewed patient and no TOC needs have been identified at this time. We will continue to monitor patient advancement through interdisciplinary progression rounds. If new patient transition needs arise, please place a TOC consult.

## 2022-05-26 NOTE — Progress Notes (Signed)
Tasha Hansen  YJE:[id number] DOB: [birth year] DOA: 05/24/2022 PCP: Marda Stalker, PA-C   Brief Narrative/Hospital Course: 25 old female with history significant for meningitis, anemia allergy GERD, migrainewho was treated for UTI earlier this month and then started Bactrim 1 DAY pta for recurrent sxs, returned to ED early with ongoing urinary symptoms and chills and was switched to ciprofloxacin, and 9/21 returned again with headache and neck pain  with associated neck pain, photophobia nausea also reported fever of 102 at home.In the ED Tmax 100.8, rest of the labs fairly stable CBC lactic acid COVID-19 negative.  Blood culture sent, negative head CT. She had LP in ED, wbc 93-117,No organisms on early culture . Started on vanco, Rocephin, and Decadron.Headache improving after arrival to [place]: Seen and examined this morning remains asymptomatic no headache fever chills.  Tmax 98.8  Mother at the bedside, denies any other symptoms no joint pain , no rashes  Asking when she can go home  Assessment and Plan: Principal Problem:   Headache Active Problems:   UTI (urinary tract infection)   Meningitis   Headache Aseptic meningitis w/ History of meningitis x2 in the past: LP shows WBC 93, 117, RBC 28, 35, neutrophils 61, 65 lymphocytes 15, 21 total protein 58, clear Gram stain/culture unremarkable, ID stopping antibiotics and monitor 24 hours.  So far work-up with HSV panel negative.  Discussed with ID and Dr. Curly Shores from neurology-CT chest ordered to rule out sarcoidosis and MRI brain ordered to rule out any structural disease-initially she declined but now agreeable to undergo both.  Previously ANA and Anti Ro Ab + in 2020- here ana + again- she was referred to Rheum in 2020, but she did not  follow-up w/ rheumatology and-when I talked about this she is agreeable to follow-up now.  I discussed with her importance to follow through to figure out to minimize  similarly recurrence of her problems which could lead to significant disability.  I discussed Dr. Benjamine Mola from rheumatology clinic-getting anti-CCP antibody, ACE level, rheumatoid factor,made referral to ambulatory clinic and he will follow-up as outpatient.  Discussed in detail with patient and mother at the bedside.  Patient also needs to avoid NSAID   UTI Suspected pyelonephrosis: Complete ceftriaxone.  Class II Obesity:Patient's Body mass index is 39.11 kg/m. : Will benefit with PCP follow-up, weight loss  healthy lifestyle and outpatient sleep evaluation.   DVT prophylaxis: enoxaparin (LOVENOX) injection 40 mg Start: 05/25/22 1000 Code Status:   Code Status: Full Code Family Communication: plan of care discussed with patient/mother at bedside. Patient status is: Observation but remains hospitalized for ongoing work-up   Level of care: Telemetry  Dispo: The patient is from: Home            Anticipated disposition: Home tomorrow if remains stable  Mobility Assessment (last 72 hours)     Mobility Assessment     Row Name 05/25/22 1906 05/25/22 0922 05/25/22 0400 05/25/22 0038     Does patient have an order for bedrest or is patient medically unstable No - Continue assessment No - Continue assessment No - Continue assessment No - Continue assessment    What is the highest level of mobility based on the progressive mobility assessment? Level 6 (Walks independently in room and hall) - Balance while walking in room without assist - Complete Level 6 (Walks independently in room and hall) - Balance while walking in room without assist - Complete Level 6 (Walks independently in room  and hall) - Balance while walking in room without assist - Complete --              Objective: Vitals last 24 hrs: Vitals:   05/25/22 0423 05/25/22 1206 05/25/22 1948 05/26/22 0508  BP: 107/68 123/81 (!) 122/96 119/87  Pulse: (!) 103 85 80 75  Resp: '19 20 20 18  '$ Temp: 98.5 F (36.9 C) 98.8 F (37.1 C)  98.3 F (36.8 C) 98.2 F (36.8 C)  TempSrc: Oral Oral Oral Oral  SpO2: 100% 99% 99% 100%  Weight:      Height:       Weight change:   Physical Examination: General exam: AAox3, older than stated age, weak appearing. HEENT:Oral mucosa moist, Ear/Nose WNL grossly, dentition normal. Respiratory system: bilaterally clear, no use of accessory muscle Cardiovascular system: S1 & S2 +, No JVD,. Gastrointestinal system: Abdomen soft,NT,ND,BS+ Nervous System:Alert, awake, moving extremities and grossly nonfocal Extremities: LE ankle edema neg, distal peripheral pulses palpable.  Skin: No rashes,no icterus. MSK: Normal muscle bulk,tone, power   Medications reviewed:  Scheduled Meds:  enoxaparin (LOVENOX) injection  40 mg Subcutaneous Q24H   loratadine  10 mg Oral Daily   multivitamin with minerals  1 tablet Oral Daily   sodium chloride flush  3 mL Intravenous Q12H   Continuous Infusions:  sodium chloride 50 mL/hr at 05/26/22 9470    Diet Order             Diet regular Room service appropriate? Yes; Fluid consistency: Thin  Diet effective now                  Unresulted Labs (From admission, onward)     Start     Ordered   06/01/22 0500  Creatinine, serum  (enoxaparin (LOVENOX)    CrCl >/= 30 ml/min)  Weekly,   R     Comments: while on enoxaparin therapy    05/25/22 0054   05/26/22 1009  Angiotensin converting enzyme  Add-on,   AD        05/26/22 1009   05/26/22 1009  Rheumatoid factor  Add-on,   AD        05/26/22 1009   96/28/36 6294  CYCLIC CITRUL PEPTIDE ANTIBODY, IGG/IGA  Add-on,   AD        05/26/22 1009   05/26/22 0758  RPR  Add-on,   AD        05/26/22 0757   05/26/22 0500  HCV Ab w Reflex to Quant PCR  Tomorrow morning,   R        05/25/22 1232   05/26/22 0500  ANA w/Reflex if Positive  Tomorrow morning,   R        05/25/22 1459   05/25/22 1436  Miscellaneous [facility] test (send-out)  Add-on,   AD       Question:  Test name / description:  Answer:  HSV IgG    05/25/22 1436   05/25/22 1436  Miscellaneous [facility] test (send-out)  Add-on,   AD       Question:  Test name / description:  Answer:  VZV IgG CSF [place] labs   05/25/22 1436   05/24/22 1500  Pathologist smear review  Once,   R        05/24/22 1500   05/24/22 1500  Pathologist smear review  Once,   R        05/24/22 1500          Data Reviewed: I  have personally reviewed following labs and imaging studies CBC: Recent Labs  Lab 05/23/22 2340 05/24/22 1528 05/25/22 0523  WBC 8.9 6.0 5.0  NEUTROABS 7.5 4.7  --   HGB 12.6 12.2 12.5  HCT 38.3 37.6 39.7  MCV 85.1 85.6 88.6  PLT 316 318 818    Basic Metabolic Panel: Recent Labs  Lab 05/23/22 2340 05/24/22 1528 05/25/22 0523  NA 137 138 137  K 3.8 4.1 4.1  CL 106 107 111  CO2 '22 22 22  '$ GLUCOSE 98 90 148*  BUN '9 8 8  '$ CREATININE 0.95 0.94 0.75  CALCIUM 9.1 8.7* 8.5*    GFR: Estimated Creatinine Clearance: 119 mL/min (by C-G formula based on SCr of 0.75 mg/dL). Liver Function Tests: Recent Labs  Lab 05/23/22 2340 05/24/22 1528  AST 13* 19  ALT 12 12  ALKPHOS 72 67  BILITOT 0.7 0.8  PROT 7.4 7.4  ALBUMIN 3.9 3.8    No results for input(s): "LIPASE", "AMYLASE" in the last 168 hours. No results for input(s): "AMMONIA" in the last 168 hours. Coagulation Profile: No results for input(s): "INR", "PROTIME" in the last 168 hours. BNP (last 3 results) No results for input(s): "PROBNP" in the last 8760 hours. HbA1C: No results for input(s): "HGBA1C" in the last 72 hours. CBG: No results for input(s): "GLUCAP" in the last 168 hours. Lipid Profile: No results for input(s): "CHOL", "HDL", "LDLCALC", "TRIG", "CHOLHDL", "LDLDIRECT" in the last 72 hours. Thyroid Function Tests: No results for input(s): "TSH", "T4TOTAL", "FREET4", "T3FREE", "THYROIDAB" in the last 72 hours. Sepsis Labs: Recent Labs  Lab 05/23/22 2340 05/24/22 1528  LATICACIDVEN 1.5 1.5     Recent Results (from the past 240 hour(s))  Urine Culture      Status: Abnormal   Collection Time: 05/23/22 11:49 PM   Specimen: Urine, Clean Catch  Result Value Ref Range Status   Specimen Description   Final    URINE, CLEAN CATCH Performed at [facility], [address], [place], [place] [address]    Special Requests   Final    NONE Performed at [facility], [address], [place], [place] [address]    Culture (A)  Final    10,000 COLONIES/mL MULTIPLE SPECIES PRESENT, SUGGEST RECOLLECTION   Report Status 05/25/2022 FINAL  Final  SARS Coronavirus 2 by RT PCR (hospital order, performed in [facility] hospital lab) *cepheid single result test* Anterior Nasal Swab     Status: None   Collection Time: 05/24/22 12:05 AM   Specimen: Anterior Nasal Swab  Result Value Ref Range Status   SARS Coronavirus 2 by RT PCR NEGATIVE NEGATIVE Final    Comment: (NOTE) SARS-CoV-2 target nucleic acids are NOT DETECTED.  The SARS-CoV-2 RNA is generally detectable in upper and lower respiratory specimens during the acute phase of infection. The lowest concentration of SARS-CoV-2 viral copies this assay can detect is 250 copies / mL. A negative result does not preclude SARS-CoV-2 infection and should not be used as the sole basis for treatment or other patient management decisions.  A negative result may occur with improper specimen collection / handling, submission of specimen other than nasopharyngeal swab, presence of viral mutation(s) within the areas targeted by this assay, and inadequate number of viral copies (<250 copies / mL). A negative result must be combined with clinical observations, patient history, and epidemiological information.  Fact Sheet for Patients:   https://www.patel.info/  Fact Sheet for Healthcare Providers: https://hall.com/  This test is not yet approved  or  cleared by the [place] and has been authorized for detection and/or diagnosis  of SARS-CoV-2 by FDA under an Emergency Use Authorization (EUA).  This EUA will remain in effect (meaning this test can be used) for the duration of the COVID-19 declaration under Section 564(b)(1) of the Act, 21 U.S.C. section 360bbb-3(b)(1), unless the authorization is terminated or revoked sooner.  Performed at [facility], [address], [place], [place] [address]   Blood culture (routine x 2)     Status: None (Preliminary result)   Collection Time: 05/24/22  3:34 PM   Specimen: BLOOD RIGHT FOREARM  Result Value Ref Range Status   Specimen Description   Final    BLOOD RIGHT FOREARM Performed at [facility], [address] [place], [place] [address]    Special Requests   Final    BOTTLES DRAWN AEROBIC AND ANAEROBIC Blood Culture adequate volume Performed at [facility], [address], [place], [place] [address]    Culture   Final    NO GROWTH < 24 HOURS Performed at [facility], [place] [address], [place], [place] [address]    Report Status PENDING  Incomplete  Blood culture (routine x 2)     Status: None (Preliminary result)   Collection Time: 05/24/22  3:38 PM   Specimen: BLOOD LEFT FOREARM  Result Value Ref Range Status   Specimen Description   Final    BLOOD LEFT FOREARM Performed at [facility], [address], [place], [place] [address]    Special Requests   Final    BOTTLES DRAWN AEROBIC AND ANAEROBIC Blood Culture adequate volume Performed at [facility], [address] [place], [place] [address]    Culture   Final    NO GROWTH < 24 HOURS Performed at [facility], [place] [address], [place], [place] [address]    Report Status PENDING  Incomplete  CSF culture     Status: None (Preliminary result)   Collection Time: 05/24/22  4:36 PM   Specimen: CSF; Cerebrospinal Fluid  Result Value Ref Range Status   Specimen Description   Final     CSF Performed at [facility] [address], [place], [place] [address]    Special Requests   Final    NONE Performed at [facility], [address] [place], [place] [address]    Gram Stain   Final    WBC PRESENT, PREDOMINANTLY PMN NO ORGANISMS SEEN CYTOSPIN SMEAR    Culture   Final    NO GROWTH < 24 HOURS Performed at [facility], [place] [address] [place], [place] [address]    Report Status PENDING  Incomplete    Antimicrobials: Anti-infectives (From admission, onward)    Start     Dose/Rate Route Frequency Ordered Stop   05/25/22 0400  vancomycin (VANCOCIN) IVPB 1000 mg/200 mL premix  Status:  Discontinued        1,000 mg 200 mL/hr over 60 Minutes Intravenous Every 8 hours 05/24/22 1912 05/26/22 0939   05/24/22 2015  vancomycin (VANCOCIN) IVPB 1000 mg/200 mL premix       See Hyperspace for full Linked Orders Report.   1,000 mg 200 mL/hr over 60 Minutes Intravenous  Once 05/24/22 1905 05/24/22 2209   05/24/22 1945  cefTRIAXone (ROCEPHIN) 2 g in sodium chloride 0.9 % 100 mL IVPB        2 g 200 mL/hr over 30 Minutes  Intravenous Every 12 hours 05/24/22 1921 05/26/22 1016   05/24/22 1915  vancomycin (VANCOCIN) IVPB 1000 mg/200 mL premix       See Hyperspace for full Linked Orders Report.   1,000 mg 200 mL/hr over 60 Minutes Intravenous  Once 05/24/22 1905 05/24/22 2031   05/24/22 1900  cefTRIAXone (ROCEPHIN) 2 g in sodium chloride 0.9 % 100 mL IVPB  Status:  Discontinued        2 g 200 mL/hr over 30 Minutes Intravenous  Once 05/24/22 1858 05/24/22 1921   05/24/22 1900  vancomycin (VANCOCIN) IVPB 1000 mg/200 mL premix  Status:  Discontinued        1,000 mg 200 mL/hr over 60 Minutes Intravenous  Once 05/24/22 1858 05/24/22 1917      Culture/Microbiology    Component Value Date/Time   SDES  05/24/2022 1636    CSF Performed at [facility] [address] [place], [place] [address]    [facility]  05/24/2022 1636     NONE Performed at [facility], [address] [place], [place] [address]    CULT  05/24/2022 1636    NO GROWTH < 24 HOURS Performed at [facility] [address] [place], [place] [address]    REPTSTATUS PENDING 05/24/2022 1636  Other culture-see note  Radiology Studies: CT Head Wo Contrast  Result Date: 05/24/2022 CLINICAL DATA:  Headache, sudden and severe. EXAM: CT HEAD WITHOUT CONTRAST TECHNIQUE: Contiguous axial images were obtained from the base of the skull through the vertex without intravenous contrast. RADIATION DOSE REDUCTION: This exam was performed according to the departmental dose-optimization program which includes automated exposure control, adjustment of the mA and/or kV according to patient size and/or use of iterative reconstruction technique. COMPARISON:  None Available. FINDINGS: Brain: No evidence of acute infarction, hemorrhage, hydrocephalus, extra-axial collection or mass lesion/mass effect. Vascular: No hyperdense vessel or unexpected calcification. Skull: Normal. Negative for fracture or focal lesion. Sinuses/Orbits: No acute finding. Other: None. IMPRESSION: No acute intracranial abnormality. Electronically Signed   By: Keane Police D.O.   On: 05/24/2022 16:05     LOS: 0 days   Antonieta Pert, MD [facility]  05/26/2022, 10:34 AM

## 2022-05-27 DIAGNOSIS — Z79899 Other long term (current) drug therapy: Secondary | ICD-10-CM | POA: Diagnosis not present

## 2022-05-27 DIAGNOSIS — R519 Headache, unspecified: Secondary | ICD-10-CM | POA: Diagnosis not present

## 2022-05-27 DIAGNOSIS — G039 Meningitis, unspecified: Secondary | ICD-10-CM | POA: Diagnosis not present

## 2022-05-27 DIAGNOSIS — R079 Chest pain, unspecified: Secondary | ICD-10-CM | POA: Diagnosis not present

## 2022-05-27 DIAGNOSIS — N390 Urinary tract infection, site not specified: Secondary | ICD-10-CM | POA: Diagnosis not present

## 2022-05-27 LAB — CSF CULTURE W GRAM STAIN: Culture: NO GROWTH

## 2022-05-27 LAB — ENA+DNA/DS+ANTICH+CENTRO+JO...
Anti JO-1: <0.2 AI (ref 0.0–0.9)
Centromere Ab Screen: <0.2 AI (ref 0.0–0.9)
Chromatin Ab SerPl-aCnc: 0.3 AI (ref 0.0–0.9)
ENA SM Ab Ser-aCnc: 0.4 AI (ref 0.0–0.9)
Ribonucleic Protein: 0.2 AI (ref 0.0–0.9)
SSA (Ro) (ENA) Antibody, IgG: >8 AI — ABNORMAL HIGH (ref 0.0–0.9)
SSB (La) (ENA) Antibody, IgG: <0.2 AI (ref 0.0–0.9)
Scleroderma (Scl-70) (ENA) Antibody, IgG: 0.3 AI (ref 0.0–0.9)
ds DNA Ab: 1 IU/mL (ref 0–9)

## 2022-05-27 LAB — HCV INTERPRETATION

## 2022-05-27 LAB — ANA W/REFLEX IF POSITIVE: Anti Nuclear Antibody (ANA): POSITIVE — AB

## 2022-05-27 LAB — HCV AB W REFLEX TO QUANT PCR: HCV Ab: NONREACTIVE

## 2022-05-27 LAB — RPR: RPR Ser Ql: NONREACTIVE

## 2022-05-27 LAB — ANGIOTENSIN CONVERTING ENZYME: Angiotensin-Converting Enzyme: 21 U/L (ref 14–82)

## 2022-05-27 LAB — RHEUMATOID FACTOR: Rheumatoid fact SerPl-aCnc: 14.6 IU/mL — ABNORMAL HIGH (ref <14.0)

## 2022-05-27 NOTE — Plan of Care (Signed)
Pt going home today. Alert and oriented. Discharge instructions given/explained with pt verbalizing understanding.

## 2022-05-27 NOTE — Discharge Summary (Signed)
Physician Discharge Summary  Tasha Hansen AOZ:[id number] DOB: August 01, 1986 DOA: 05/24/2022  PCP: Marda Stalker, PA-C  Admit date: 05/24/2022 Discharge date: 05/27/2022 Recommendations for Outpatient Follow-up:  Follow up with PCP, neurology, rheumatology in 1 weeks-call for appointment  Discharge Dispo: home Discharge Condition: Stable Code Status:   Code Status: Full Code Diet recommendation:  Diet Order             Diet regular Room service appropriate? Yes; Fluid consistency: Thin  Diet effective now                    Brief/Interim Summary: 81 old female with history significant for meningitis, anemia allergy GERD, migrainewho was treated for UTI earlier this month and then started Bactrim 1 DAY pta for recurrent sxs, returned to ED early with ongoing urinary symptoms and chills and was switched to ciprofloxacin, and 9/21 returned again with headache and neck pain  with associated neck pain, photophobia nausea also reported fever of 102 at home.In the ED Tmax 100.8, rest of the labs fairly stable CBC lactic acid COVID-19 negative.  Blood culture sent, negative head CT. She had LP CS: WBC 93, 117, RBC 28, 35, neutrophils 61, 65 lymphocytes 15, 21 total protein 58, clear Gram stain/culture unremarkable, ID stopping antibiotics and monitor 24 hours.  So far work-up with HSV panel negative.Previously ANA and Anti Ro Ab + in 2020- here ana + again- she was referred to Rheum in 2020, but she did not  follow-up w/ rheumatology and-when I talked about this she is agreeable to follow-up now.Discussed with Dr. Benjamine Mola from rheumatology, seen by ID, discussed with neurology.  Underwent CT chest no sarcoidosis, MRI brain with and without no acute intracranial finding.  ACE level normal range factor positive.  Outpatient referral made to rheumatology clinic and also number provided to the patient so she can call for follow-up.  At this time she feels well, she is requesting to be discharged today,  mother at the bedside requesting the same.  Discussed with ID Dr Baxter Flattery and okay for dc since she did well w/o antibiotics    Discharge Diagnoses:  Principal Problem:   Headache Active Problems:   UTI (urinary tract infection)   Meningitis  Headache Aseptic meningitis w/ History of meningitis x2 in the past: She had LP CS: WBC 93, 117, RBC 28, 35, neutrophils 61, 65 lymphocytes 15, 21 total protein 58, clear Gram stain/culture unremarkable, ID stopping antibiotics and monitor 24 hours.  So far work-up with HSV panel negative.Previously ANA and Anti Ro Ab + in 2020- here ana + again- she was referred to Rheum in 2020, but she did not  follow-up w/ rheumatology and-when I talked about this she is agreeable to follow-up now.Discussed with Dr. Benjamine Mola from rheumatology, seen by ID, discussed with neurology.  Underwent CT chest no sarcoidosis, MRI brain with and without no acute intracranial finding.  ACE level normal range factor positive.  Outpatient referral made to rheumatology clinic and also number provided to the patient so she can call for follow-up.  At this time she feels well, she is requesting to be discharged today, mother at the bedside requesting the same.  Discussed with ID Dr Baxter Flattery and okay for dc since she did well w/o antibiotics.  Reinforced to avoid NSAIDs/Bactrim  UTI Suspected pyelonephrosis: Completed ceftriaxone.  Afebrile no urinary symptoms   Class II Obesity:Patient's Body mass index is 39.11 kg/m. : Will benefit with PCP follow-up, weight loss  healthy lifestyle  and outpatient sleep evaluation.   Consults: Infectious disease, neurology, rheumatology Subjective: Alert and oriented, reports she feels at baseline.  Questionable for discharge this morning  Discharge Exam: Vitals:   05/26/22 2035 05/27/22 0549  BP: 108/78 115/84  Pulse: 88 87  Resp: 18 18  Temp: (!) 97.4 F (36.3 C) 98.5 F (36.9 C)  SpO2: 100% 99%   General: Pt is alert, awake, not in acute  distress Cardiovascular: RRR, S1/S2 +, no rubs, no gallops Respiratory: CTA bilaterally, no wheezing, no rhonchi Abdominal: Soft, NT, ND, bowel sounds + Extremities: no edema, no cyanosis  Discharge Instructions  Discharge Instructions     Ambulatory referral to Neurology   Complete by: As directed    An appointment is requested in approximately: 2 weeks   Ambulatory referral to Rheumatology   Complete by: As directed    Dr Benjamine Mola OR Dr Sabas Sous or any other MD   Discharge instructions   Complete by: As directed    Follow-up with PCP, neurology and rheumatology.  Please make sure you follow-up with rheumatology as instructed for pending lab work and pending management/plan of care  Please call call MD or return to ER for similar or worsening recurring problem that brought you to hospital or if any fever,nausea/vomiting,abdominal pain, uncontrolled pain, chest pain,  shortness of breath or any other alarming symptoms.  Please follow-up your doctor as instructed in a week time and call the office for appointment.  Please avoid alcohol, smoking, or any other illicit substance and maintain healthy habits including taking your regular medications as prescribed.  You were cared for by a hospitalist during your hospital stay. If you have any questions about your discharge medications or the care you received while you were in the hospital after you are discharged, you can call the unit and ask to speak with the hospitalist on call if the hospitalist that took care of you is not available.  Once you are discharged, your primary care physician will handle any further medical issues. Please note that NO REFILLS for any discharge medications will be authorized once you are discharged, as it is imperative that you return to your primary care physician (or establish a relationship with a primary care physician if you do not have one) for your aftercare needs so that they can reassess your need for  medications and monitor your lab values   Increase activity slowly   Complete by: As directed       Allergies as of 05/27/2022       Reactions   Diphenhydramine Hcl Hives, Itching   Oseltamivir Phosphate Hives, Itching        Medication List     STOP taking these medications    cephALEXin 500 MG capsule Commonly known as: KEFLEX   ibuprofen 200 MG tablet Commonly known as: ADVIL       TAKE these medications    Junel FE 1/20 1-20 MG-MCG tablet Generic drug: norethindrone-ethinyl estradiol-FE Take 1 tablet by mouth daily.   loratadine 10 MG tablet Commonly known as: CLARITIN Take 10 mg by mouth daily.   multivitamin with minerals tablet Take 1 tablet by mouth daily.   Semaglutide-Weight Management 0.25 MG/0.5ML Soaj Inject 0.25 mg into the skin once a week.   TUMS PO Take 2 tablets by mouth daily as needed (reflux).        Follow-up Information     Marda Stalker, PA-C Follow up in 1 week(s).   Specialty: Family Medicine Contact information:  [place] [address] [phone]         Collier Salina, MD Follow up in 1 week(s).   Specialty: Rheumatology Why: Call office in 2 days if you do not hear from them Contact information: [address] [place] [address] [phone]         [facility] Follow up.   Specialty: Neurology Why: Call the office if you do not hear from them.  1 to 2 weeks Contact information: [place] [address] [phone]               Allergies  Allergen Reactions   Diphenhydramine Hcl Hives and Itching   Oseltamivir Phosphate Hives and Itching    The results of significant diagnostics from this hospitalization (including imaging, microbiology, ancillary and laboratory) are listed below for reference.    Microbiology: Recent Results (from the past 240 hour(s))  Urine Culture     Status: Abnormal    Collection Time: 05/23/22 11:49 PM   Specimen: Urine, Clean Catch  Result Value Ref Range Status   Specimen Description   Final    URINE, CLEAN CATCH Performed at [facility], [address] [place], [place] [address]    Special Requests   Final    NONE Performed at [facility], [address], [place], [place] [address]    Culture (A)  Final    10,000 COLONIES/mL MULTIPLE SPECIES PRESENT, SUGGEST RECOLLECTION   Report Status 05/25/2022 FINAL  Final  SARS Coronavirus 2 by RT PCR (hospital order, performed in [facility] hospital lab) *cepheid single result test* Anterior Nasal Swab     Status: None   Collection Time: 05/24/22 12:05 AM   Specimen: Anterior Nasal Swab  Result Value Ref Range Status   SARS Coronavirus 2 by RT PCR NEGATIVE NEGATIVE Final    Comment: (NOTE) SARS-CoV-2 target nucleic acids are NOT DETECTED.  The SARS-CoV-2 RNA is generally detectable in upper and lower respiratory specimens during the acute phase of infection. The lowest concentration of SARS-CoV-2 viral copies this assay can detect is 250 copies / mL. A negative result does not preclude SARS-CoV-2 infection and should not be used as the sole basis for treatment or other patient management decisions.  A negative result may occur with improper specimen collection / handling, submission of specimen other than nasopharyngeal swab, presence of viral mutation(s) within the areas targeted by this assay, and inadequate number of viral copies (<250 copies / mL). A negative result must be combined with clinical observations, patient history, and epidemiological information.  Fact Sheet for Patients:   https://www.patel.info/  Fact Sheet for Healthcare Providers: https://hall.com/  This test is not yet approved or  cleared by the [place] FDA and has been authorized for detection and/or diagnosis of SARS-CoV-2  by FDA under an Emergency Use Authorization (EUA).  This EUA will remain in effect (meaning this test can be used) for the duration of the COVID-19 declaration under Section 564(b)(1) of the Act, 21 U.S.C. section 360bbb-3(b)(1), unless the authorization is terminated or revoked sooner.  Performed at [facility], [address], [place], [place] [address]   Blood culture (routine x 2)     Status: None (Preliminary result)   Collection Time: 05/24/22  3:34 PM   Specimen: BLOOD RIGHT FOREARM  Result Value Ref Range Status   Specimen Description   Final    BLOOD RIGHT FOREARM Performed at [facility], [address], [place], [place] [address]    Special Requests   Final    BOTTLES DRAWN AEROBIC AND ANAEROBIC Blood Culture adequate volume Performed at [facility], [address] [place], [place] [address]    Culture   Final    NO GROWTH 3 DAYS Performed at [facility], [place] [address], [place], [place] [address]    Report Status PENDING  Incomplete  Blood culture (routine x 2)     Status: None (Preliminary result)   Collection Time: 05/24/22  3:38 PM   Specimen: BLOOD LEFT FOREARM  Result Value Ref Range Status   Specimen Description   Final    BLOOD LEFT FOREARM Performed at [facility], [address], [place], [place] [address]    Special Requests   Final    BOTTLES DRAWN AEROBIC AND ANAEROBIC Blood Culture adequate volume Performed at [facility], [address], [place], [place] [address]    Culture   Final    NO GROWTH 3 DAYS Performed at [facility], [place] [address], [place], [place] [address]    Report Status PENDING  Incomplete  CSF culture     Status: None   Collection Time: 05/24/22  4:36 PM   Specimen: CSF; Cerebrospinal Fluid  Result Value Ref Range Status   Specimen Description   Final    CSF Performed at [facility]  [address] [place], [place] [address]    Special Requests   Final    NONE Performed at [facility], [address] [place], [place] [address]    Gram Stain   Final    WBC PRESENT, PREDOMINANTLY PMN NO ORGANISMS SEEN CYTOSPIN SMEAR    Culture   Final    NO GROWTH 3 DAYS Performed at [facility], [place] [address], [place], [place] [address]    Report Status 05/27/2022 FINAL  Final    Procedures/Studies: MR BRAIN W WO CONTRAST  Result Date: 05/26/2022 CLINICAL DATA:  Concern for infection. EXAM: MRI HEAD WITHOUT AND WITH CONTRAST TECHNIQUE: Multiplanar, multiecho pulse sequences of the brain and surrounding structures were obtained without and with intravenous contrast. CONTRAST:  76m GADAVIST GADOBUTROL 1 MMOL/ML IV SOLN COMPARISON:  CT head 05/24/2022 FINDINGS: Brain: No acute infarction, hemorrhage, hydrocephalus, extra-axial collection or mass lesion. Vascular: Normal flow voids. Skull and upper cervical spine: Normal marrow signal. Sinuses/Orbits: Mild right maxillary sinus mucosal thickening. Other: None. IMPRESSION: No finding to suggest intracranial infection. Electronically Signed   By: HMarin RobertsM.D.   On: 05/26/2022 16:34   CT CHEST W CONTRAST  Result Date: 05/26/2022 CLINICAL DATA:  Chest pain. EXAM: CT CHEST WITH CONTRAST TECHNIQUE: Multidetector CT imaging of the chest was performed during intravenous contrast administration. RADIATION DOSE REDUCTION: This exam was performed according to the departmental dose-optimization program which includes automated exposure control, adjustment of the mA and/or kV according to patient size and/or use of iterative reconstruction technique. CONTRAST:  723mOMNIPAQUE IOHEXOL 300 MG/ML  SOLN COMPARISON:  June 22, 2004. FINDINGS: Cardiovascular: No significant vascular findings. Normal heart size. No pericardial effusion. Mediastinum/Nodes: No enlarged mediastinal, hilar, or axillary lymph nodes. Thyroid gland, trachea,  and esophagus demonstrate no significant findings. Lungs/Pleura: Lungs are clear. No pleural effusion or pneumothorax. Upper Abdomen: No acute abnormality. Musculoskeletal: No chest wall abnormality. No acute or significant osseous findings. IMPRESSION: No definite abnormality seen in the chest. Electronically Signed   By: JaJeneen Rinks  Murlean Caller M.D.   On: 05/26/2022 11:47   CT Head Wo Contrast  Result Date: 05/24/2022 CLINICAL DATA:  Headache, sudden and severe. EXAM: CT HEAD WITHOUT CONTRAST TECHNIQUE: Contiguous axial images were obtained from the base of the skull through the vertex without intravenous contrast. RADIATION DOSE REDUCTION: This exam was performed according to the departmental dose-optimization program which includes automated exposure control, adjustment of the mA and/or kV according to patient size and/or use of iterative reconstruction technique. COMPARISON:  None Available. FINDINGS: Brain: No evidence of acute infarction, hemorrhage, hydrocephalus, extra-axial collection or mass lesion/mass effect. Vascular: No hyperdense vessel or unexpected calcification. Skull: Normal. Negative for fracture or focal lesion. Sinuses/Orbits: No acute finding. Other: None. IMPRESSION: No acute intracranial abnormality. Electronically Signed   By: Keane Police D.O.   On: 05/24/2022 16:05    Labs: BNP (last 3 results) No results for input(s): "BNP" in the last 8760 hours. Basic Metabolic Panel: Recent Labs  Lab 05/23/22 2340 05/24/22 1528 05/25/22 0523  NA 137 138 137  K 3.8 4.1 4.1  CL 106 107 111  CO2 '22 22 22  '$ GLUCOSE 98 90 148*  BUN '9 8 8  '$ CREATININE 0.95 0.94 0.75  CALCIUM 9.1 8.7* 8.5*   Liver Function Tests: Recent Labs  Lab 05/23/22 2340 05/24/22 1528  AST 13* 19  ALT 12 12  ALKPHOS 72 67  BILITOT 0.7 0.8  PROT 7.4 7.4  ALBUMIN 3.9 3.8   No results for input(s): "LIPASE", "AMYLASE" in the last 168 hours. No results for input(s): "AMMONIA" in the last 168 hours. CBC: Recent  Labs  Lab 05/23/22 2340 05/24/22 1528 05/25/22 0523  WBC 8.9 6.0 5.0  NEUTROABS 7.5 4.7  --   HGB 12.6 12.2 12.5  HCT 38.3 37.6 39.7  MCV 85.1 85.6 88.6  PLT 316 318 300   Cardiac Enzymes: No results for input(s): "CKTOTAL", "CKMB", "CKMBINDEX", "TROPONINI" in the last 168 hours. BNP: Invalid input(s): "POCBNP" CBG: No results for input(s): "GLUCAP" in the last 168 hours. D-Dimer No results for input(s): "DDIMER" in the last 72 hours. Hgb A1c No results for input(s): "HGBA1C" in the last 72 hours. Lipid Profile No results for input(s): "CHOL", "HDL", "LDLCALC", "TRIG", "CHOLHDL", "LDLDIRECT" in the last 72 hours. Thyroid function studies No results for input(s): "TSH", "T4TOTAL", "T3FREE", "THYROIDAB" in the last 72 hours.  Invalid input(s): "FREET3" Anemia work up No results for input(s): "VITAMINB12", "FOLATE", "FERRITIN", "TIBC", "IRON", "RETICCTPCT" in the last 72 hours. Urinalysis    Component Value Date/Time   COLORURINE YELLOW 05/23/2022 2349   APPEARANCEUR HAZY (A) 05/23/2022 2349   LABSPEC 1.027 05/23/2022 2349   PHURINE 6.5 05/23/2022 2349   GLUCOSEU NEGATIVE 05/23/2022 2349   HGBUR LARGE (A) 05/23/2022 2349   BILIRUBINUR SMALL (A) 05/23/2022 2349   BILIRUBINUR n 12/14/2014 1054   KETONESUR NEGATIVE 05/23/2022 2349   PROTEINUR 30 (A) 05/23/2022 2349   UROBILINOGEN negative 12/14/2014 1054   UROBILINOGEN 0.2 11/29/2011 1213   NITRITE POSITIVE (A) 05/23/2022 2349   LEUKOCYTESUR MODERATE (A) 05/23/2022 2349   Sepsis Labs Recent Labs  Lab 05/23/22 2340 05/24/22 1528 05/25/22 0523  WBC 8.9 6.0 5.0   Microbiology Recent Results (from the past 240 hour(s))  Urine Culture     Status: Abnormal   Collection Time: 05/23/22 11:49 PM   Specimen: Urine, Clean Catch  Result Value Ref Range Status   Specimen Description   Final    URINE, CLEAN CATCH Performed at [facility], [id number]  [address], [place], [place] [address]    Special Requests    Final    NONE Performed at [facility], [address] [place], [place] [address]    Culture (A)  Final    10,000 COLONIES/mL MULTIPLE SPECIES PRESENT, SUGGEST RECOLLECTION   Report Status 05/25/2022 FINAL  Final  SARS Coronavirus 2 by RT PCR (hospital order, performed in [facility] hospital lab) *cepheid single result test* Anterior Nasal Swab     Status: None   Collection Time: 05/24/22 12:05 AM   Specimen: Anterior Nasal Swab  Result Value Ref Range Status   SARS Coronavirus 2 by RT PCR NEGATIVE NEGATIVE Final    Comment: (NOTE) SARS-CoV-2 target nucleic acids are NOT DETECTED.  The SARS-CoV-2 RNA is generally detectable in upper and lower respiratory specimens during the acute phase of infection. The lowest concentration of SARS-CoV-2 viral copies this assay can detect is 250 copies / mL. A negative result does not preclude SARS-CoV-2 infection and should not be used as the sole basis for treatment or other patient management decisions.  A negative result may occur with improper specimen collection / handling, submission of specimen other than nasopharyngeal swab, presence of viral mutation(s) within the areas targeted by this assay, and inadequate number of viral copies (<250 copies / mL). A negative result must be combined with clinical observations, patient history, and epidemiological information.  Fact Sheet for Patients:   https://www.patel.info/  Fact Sheet for Healthcare Providers: https://hall.com/  This test is not yet approved or  cleared by the [place] FDA and has been authorized for detection and/or diagnosis of SARS-CoV-2 by FDA under an Emergency Use Authorization (EUA).  This EUA will remain in effect (meaning this test can be used) for the duration of the COVID-19 declaration under Section 564(b)(1) of the Act, 21 U.S.C. section 360bbb-3(b)(1), unless the authorization is terminated  or revoked sooner.  Performed at [facility], [address] [place], [place] [address]   Blood culture (routine x 2)     Status: None (Preliminary result)   Collection Time: 05/24/22  3:34 PM   Specimen: BLOOD RIGHT FOREARM  Result Value Ref Range Status   Specimen Description   Final    BLOOD RIGHT FOREARM Performed at [facility], [address] [place], [place] [address]    Special Requests   Final    BOTTLES DRAWN AEROBIC AND ANAEROBIC Blood Culture adequate volume Performed at [facility], [address], [place], [place] [address]    Culture   Final    NO GROWTH 3 DAYS Performed at [facility], [place] [address], [place], [place] [address]    Report Status PENDING  Incomplete  Blood culture (routine x 2)     Status: None (Preliminary result)   Collection Time: 05/24/22  3:38 PM   Specimen: BLOOD LEFT FOREARM  Result Value Ref Range Status   Specimen Description   Final    BLOOD LEFT FOREARM Performed at [facility], [address] [place], [place] [address]    Special Requests   Final    BOTTLES DRAWN AEROBIC AND ANAEROBIC Blood Culture adequate volume Performed at [facility], [address] [place], [place] [address]    Culture   Final    NO GROWTH 3 DAYS Performed at [facility], [place] [address], [place], [place] [address]    Report Status PENDING  Incomplete  CSF culture     Status: None  Collection Time: 05/24/22  4:36 PM   Specimen: CSF; Cerebrospinal Fluid  Result Value Ref Range Status   Specimen Description   Final    CSF Performed at [facility] [address] [place], [place] [address]    Special Requests   Final    NONE Performed at [facility], [address], [place], [place] [address]    Gram Stain   Final    WBC PRESENT, PREDOMINANTLY PMN NO ORGANISMS SEEN CYTOSPIN SMEAR    Culture   Final     NO GROWTH 3 DAYS Performed at [facility], [place] [address], [place], [place] [address]    Report Status 05/27/2022 FINAL  Final   Time coordinating discharge: 25 minutes  SIGNED: Antonieta Pert, MD  [facility] 05/27/2022, 10:20 AM  If 7PM-7AM, please contact night-coverage [email or web address]

## 2022-05-29 LAB — CYCLIC CITRUL PEPTIDE ANTIBODY, IGG/IGA: CCP Antibodies IgG/IgA: 34 units — ABNORMAL HIGH (ref 0–19)

## 2022-05-30 LAB — CULTURE, BLOOD (ROUTINE X 2)
Culture: NO GROWTH
Culture: NO GROWTH
Special Requests: ADEQUATE
Special Requests: ADEQUATE

## 2022-05-31 LAB — HSV(HERPES SIMPLEX VRS) I + II AB-IGG
HSV 1 Glycoprotein G Ab, IgG: <0.91 index (ref 0.00–0.90)
HSV 2 Glycoprotein G Ab, IgG: <0.91 index (ref 0.00–0.90)

## 2022-05-31 LAB — MISC LABCORP TEST (SEND OUT): Labcorp test code: 9985

## 2022-06-02 DIAGNOSIS — Z6839 Body mass index (BMI) 39.0-39.9, adult: Secondary | ICD-10-CM | POA: Diagnosis not present

## 2022-06-02 DIAGNOSIS — G039 Meningitis, unspecified: Secondary | ICD-10-CM | POA: Diagnosis not present

## 2022-06-02 DIAGNOSIS — N10 Acute tubulo-interstitial nephritis: Secondary | ICD-10-CM | POA: Diagnosis not present

## 2022-06-05 LAB — MISC LABCORP TEST (SEND OUT): Labcorp test code: 9985

## 2022-06-05 LAB — VARICELLA ZOSTER ANTIBODY, IGG: Varicella IgG: 2264 index (ref >165)

## 2022-06-11 NOTE — Progress Notes (Unsigned)
Office Visit Note  Patient: Tasha Hansen             Date of Birth: [birth year]           MRN: [id number]             PCP: Marda Stalker, PA-C Referring: Antonieta Pert, MD Visit Date: 06/12/2022 Occupation: Leslie  Subjective:  New Patient (Initial Visit) (Lab results have abnormal results. Joint pain in hands, wrists, knees, ankles, left shoulder, and lower back. )   History of Present Illness: Tasha Hansen is a 36 y.o. female here for evaluation of recurrent aseptic meningitis with positive autoantibody results. She has suffered from meningitis without infectious source identified in 2013, 2019, and recently in September this year in setting of preceding UTI.  These episodes were characterized by fairly sudden onset with symptoms of headache fevers malaise redness affecting bilateral eyes and central facial rashes.  Typically she has been seen at the hospital for these episodes always with lumbar puncture ruling out bacterial meningitis but initial empiric treatment with vancomycin and Rocephin is typically started with quick resolution of her inflammation.  She does not recall specific preceding infection or medication treatment for onset of the previous episodes.  In 2019 did recall exposure running through pulmonary patient warned but did not personally have any symptoms or confirmed illness.  This year with symptoms and confirmed culture and sensitivity on urine studies for infection she was tried on several antibiotics the last one was Bactrim and she started developing her characteristic signs of meningitis within hours after taking the second dose of the medication.  Outside of the specific episodes she does have sometimes dry but not requiring specific treatment for symptoms and no complaint about dry mouth.  She has some joint pain in multiple areas including bilateral hands low back knees and ankles but not typically associated with any visible swelling or  discoloration.  She denies any rashes alopecia lymphadenopathy or Raynaud's symptoms.  She has 2 children aged 2 and 4 without any major pregnancy complications.  Labs reviewed 05/2022 ANA pos SSA >8.0 dsDNA, RNP, Sm, Scl-70, SSB, chromatin, Jo-1, centromere neg RF 14.6 CCP 34 CSF WBC 93-117 65% neutrophils CSF TP 58 CSF glucose 56 ACE 21 HIV neg HSV neg HCV neg  Activities of Daily Living:  Patient reports morning stiffness for 8 hours.   Patient Reports nocturnal pain.  Difficulty dressing/grooming: Denies Difficulty climbing stairs: Denies Difficulty getting out of chair: Denies Difficulty using hands for taps, buttons, cutlery, and/or writing: Denies  Review of Systems  Constitutional:  Negative for fatigue.  HENT:  Negative for mouth sores and mouth dryness.   Eyes:  Positive for dryness.  Respiratory:  Negative for shortness of breath.   Cardiovascular:  Negative for chest pain and palpitations.  Gastrointestinal:  Negative for blood in stool, constipation and diarrhea.  Endocrine: Negative for increased urination.  Genitourinary:  Negative for involuntary urination.  Musculoskeletal:  Positive for joint pain, joint pain, myalgias, muscle weakness, morning stiffness, muscle tenderness and myalgias. Negative for gait problem and joint swelling.  Skin:  Negative for color change, rash, hair loss and sensitivity to sunlight.  Allergic/Immunologic: Negative for susceptible to infections.  Neurological:  Positive for headaches. Negative for dizziness.  Hematological:  Negative for swollen glands.  Psychiatric/Behavioral:  Negative for depressed mood and sleep disturbance. The patient is not nervous/anxious.     PMFS History:  Patient Active Problem List   Diagnosis Date  Noted   ANA positive 06/12/2022   Meningitis    UTI (urinary tract infection) 05/25/2022   Headache 05/24/2022   Anemia associated with acute blood loss 06/28/2020   Breech presentation delivered  06/27/2020   Status post primary low transverse cesarean section 10/24 06/27/2020   Postpartum care following cesarean delivery 10/24 06/27/2020    Past Medical History:  Diagnosis Date   Allergy    Anemia    GERD (gastroesophageal reflux disease)    Headache    "monthly" (08/27/2018)   Meningitis 2013   Migraine    "a few times/year" (08/27/2018)    Family History  Problem Relation Age of Onset   Hypertension Father    Hypertension Maternal Grandmother    Diabetes Maternal Grandmother    Hypertension Maternal Grandfather    Diabetes Maternal Grandfather    Hypertension Paternal Grandmother    Diabetes Paternal Grandmother    Diabetes Paternal Grandfather    Hypertension Paternal Merchant navy officer    Healthy Daughter    Healthy Son    Past Surgical History:  Procedure Laterality Date   CESAREAN SECTION N/A 06/27/2020   Procedure: CESAREAN SECTION;  Surgeon: Aloha Gell, MD;  Location: [facility] LD [facility]S;  Service: Obstetrics;  Laterality: N/A;  Breech Presentation   [place]   as an infant   WISDOM TOOTH EXTRACTION  age 75   Social History   Social History Narrative   College degree   Works at [employer] History  Administered Date(s) Administered   Influenza,inj,Quad PF,6+ Mos 06/26/2013, 06/29/2020   Influenza-Unspecified 05/29/2014   Tdap 10/13/2013     Objective: Vital Signs: BP 110/74 (BP Location: Left Arm, Patient Position: Sitting, Cuff Size: Normal)   Pulse 86   Resp 14   Ht '5\' 5"'$  (1.651 m)   Wt 239 lb 9.6 oz (108.7 kg)   LMP 05/24/2022 (Approximate) Comment: takes birth control pills and skips last week and starts new pack  BMI 39.87 kg/m    Physical Exam Constitutional:      Appearance: She is obese.  HENT:     Right Ear: External ear normal.     Left Ear: External ear normal.     Mouth/Throat:     Mouth: Mucous membranes are moist.     Pharynx: Oropharynx is clear.  Eyes:     Extraocular Movements:  Extraocular movements intact.     Conjunctiva/sclera: Conjunctivae normal.  Cardiovascular:     Rate and Rhythm: Normal rate and regular rhythm.  Pulmonary:     Effort: Pulmonary effort is normal.     Breath sounds: Normal breath sounds.  Musculoskeletal:     Right lower leg: No edema.     Left lower leg: No edema.  Lymphadenopathy:     Cervical: No cervical adenopathy.  Skin:    General: Skin is warm and dry.     Findings: No rash.  Neurological:     Mental Status: She is alert.     Gait: Gait normal.     Deep Tendon Reflexes: Reflexes normal.  Psychiatric:        Mood and Affect: Mood normal.      Musculoskeletal Exam:  Neck full ROM no tenderness Shoulders full ROM no tenderness or swelling Elbows full ROM no tenderness or swelling Wrists full ROM no tenderness or swelling Fingers full ROM no tenderness or swelling Knees full ROM no tenderness or swelling Ankles full  ROM no tenderness or swelling   Investigation: No additional findings.  Imaging: MR BRAIN W WO CONTRAST  Result Date: 05/26/2022 CLINICAL DATA:  Concern for infection. EXAM: MRI HEAD WITHOUT AND WITH CONTRAST TECHNIQUE: Multiplanar, multiecho pulse sequences of the brain and surrounding structures were obtained without and with intravenous contrast. CONTRAST:  77m GADAVIST GADOBUTROL 1 MMOL/ML IV SOLN COMPARISON:  CT head 05/24/2022 FINDINGS: Brain: No acute infarction, hemorrhage, hydrocephalus, extra-axial collection or mass lesion. Vascular: Normal flow voids. Skull and upper cervical spine: Normal marrow signal. Sinuses/Orbits: Mild right maxillary sinus mucosal thickening. Other: None. IMPRESSION: No finding to suggest intracranial infection. Electronically Signed   By: HMarin RobertsM.D.   On: 05/26/2022 16:34   CT CHEST W CONTRAST  Result Date: 05/26/2022 CLINICAL DATA:  Chest pain. EXAM: CT CHEST WITH CONTRAST TECHNIQUE: Multidetector CT imaging of the chest was performed during intravenous  contrast administration. RADIATION DOSE REDUCTION: This exam was performed according to the departmental dose-optimization program which includes automated exposure control, adjustment of the mA and/or kV according to patient size and/or use of iterative reconstruction technique. CONTRAST:  783mOMNIPAQUE IOHEXOL 300 MG/ML  SOLN COMPARISON:  June 22, 2004. FINDINGS: Cardiovascular: No significant vascular findings. Normal heart size. No pericardial effusion. Mediastinum/Nodes: No enlarged mediastinal, hilar, or axillary lymph nodes. Thyroid gland, trachea, and esophagus demonstrate no significant findings. Lungs/Pleura: Lungs are clear. No pleural effusion or pneumothorax. Upper Abdomen: No acute abnormality. Musculoskeletal: No chest wall abnormality. No acute or significant osseous findings. IMPRESSION: No definite abnormality seen in the chest. Electronically Signed   By: JaMarijo Conception.D.   On: 05/26/2022 11:47   CT Head Wo Contrast  Result Date: 05/24/2022 CLINICAL DATA:  Headache, sudden and severe. EXAM: CT HEAD WITHOUT CONTRAST TECHNIQUE: Contiguous axial images were obtained from the base of the skull through the vertex without intravenous contrast. RADIATION DOSE REDUCTION: This exam was performed according to the departmental dose-optimization program which includes automated exposure control, adjustment of the mA and/or kV according to patient size and/or use of iterative reconstruction technique. COMPARISON:  None Available. FINDINGS: Brain: No evidence of acute infarction, hemorrhage, hydrocephalus, extra-axial collection or mass lesion/mass effect. Vascular: No hyperdense vessel or unexpected calcification. Skull: Normal. Negative for fracture or focal lesion. Sinuses/Orbits: No acute finding. Other: None. IMPRESSION: No acute intracranial abnormality. Electronically Signed   By: ImKeane Police.O.   On: 05/24/2022 16:05    Recent Labs: Lab Results  Component Value Date   WBC 5.0  05/25/2022   HGB 12.5 05/25/2022   PLT 300 05/25/2022   NA 137 05/25/2022   K 4.1 05/25/2022   CL 111 05/25/2022   CO2 22 05/25/2022   GLUCOSE 148 (H) 05/25/2022   BUN 8 05/25/2022   CREATININE 0.75 05/25/2022   BILITOT 0.8 05/24/2022   ALKPHOS 67 05/24/2022   AST 19 05/24/2022   ALT 12 05/24/2022   PROT 7.4 05/24/2022   ALBUMIN 3.8 05/24/2022   CALCIUM 8.5 (L) 05/25/2022   GFRAA >60 08/29/2018    Speciality Comments: No specialty comments available.  Procedures:  No procedures performed Allergies: Diphenhydramine hcl and Oseltamivir phosphate   Assessment / Plan:     Visit Diagnoses: ANA positive - Plan: Rheumatoid factor, Cyclic citrul peptide antibody, IgG, Sedimentation rate, C-reactive protein, C3 and C4, Cardiolipin antibodies, IgG, IgM, IgA, Lupus Anticoagulant Eval w/Reflex, Beta-2 glycoprotein antibodies  Positive ANA specifically high Ro antibody titer checked in association with recurrent episodes of aseptic meningitis.  Most recent  episode could have been medication associated and she was already advised to try avoiding Bactrim or NSAIDs by ID consultant.  Clinically she is free of symptoms and does not describe a history of any specific criteria.  We will recheck the RA serology markers I suspect these were false positives in the setting of acute inflammatory response.  Also check a sedimentation rate and CRP now that she appears to be back to baseline.  We will also check for serologic antiphospholipid antibodies and serum complement.  At this time unless a significant abnormality is identified would have a suspicion for Sjogren's syndrome but not recommending long-term DMARD treatment at this time would just observe.  Meningitis  Symptoms currently completely improved, no specific CSF findings and no pattern of enhancement seen on brian MRI.  Orders: Orders Placed This Encounter  Procedures   Rheumatoid factor   Cyclic citrul peptide antibody, IgG    Sedimentation rate   C-reactive protein   C3 and C4   Cardiolipin antibodies, IgG, IgM, IgA   Lupus Anticoagulant Eval w/Reflex   Beta-2 glycoprotein antibodies   No orders of the defined types were placed in this encounter.   Follow-Up Instructions: Return in about 1 year (around 06/13/2023), or if symptoms worsen or fail to improve.   Collier Salina, MD  Note - This record has been created using [employer].  Chart creation errors have been sought, but may not always  have been located. Such creation errors do not reflect on  the standard of medical care.

## 2022-06-12 ENCOUNTER — Ambulatory Visit: Payer: 59 | Attending: Internal Medicine | Admitting: Internal Medicine

## 2022-06-12 ENCOUNTER — Encounter: Payer: Self-pay | Admitting: Internal Medicine

## 2022-06-12 VITALS — BP 110/74 | HR 86 | Resp 14 | Ht 65.0 in | Wt 239.6 lb

## 2022-06-12 DIAGNOSIS — R7689 Other specified abnormal immunological findings in serum: Secondary | ICD-10-CM | POA: Insufficient documentation

## 2022-06-12 DIAGNOSIS — G039 Meningitis, unspecified: Secondary | ICD-10-CM | POA: Diagnosis not present

## 2022-06-12 DIAGNOSIS — R768 Other specified abnormal immunological findings in serum: Secondary | ICD-10-CM | POA: Insufficient documentation

## 2022-06-12 NOTE — Addendum Note (Signed)
Addended by: Collier Salina on: 06/12/2022 04:14 PM   Modules accepted: Orders

## 2022-06-17 LAB — BETA-2 GLYCOPROTEIN ANTIBODIES
Beta-2 Glyco 1 IgA: <2 U/mL (ref <20.0)
Beta-2 Glyco 1 IgM: <2 U/mL (ref <20.0)
Beta-2 Glyco I IgG: <2 U/mL (ref <20.0)

## 2022-06-17 LAB — LUPUS ANTICOAGULANT EVAL W/ REFLEX
PTT-LA Screen: 30 s (ref <=40)
dRVVT: 41 s (ref <=45)

## 2022-06-17 LAB — CARDIOLIPIN ANTIBODIES, IGG, IGM, IGA
Anticardiolipin IgA: <2 APL-U/mL (ref <20.0)
Anticardiolipin IgG: <2 GPL-U/mL (ref <20.0)
Anticardiolipin IgM: <2 MPL-U/mL (ref <20.0)

## 2022-06-17 LAB — SEDIMENTATION RATE: Sed Rate: 38 mm/h — ABNORMAL HIGH (ref 0–20)

## 2022-06-17 LAB — RHEUMATOID FACTOR: Rheumatoid fact SerPl-aCnc: <14 IU/mL (ref <14)

## 2022-06-17 LAB — C3 AND C4
C3 Complement: 185 mg/dL (ref 83–193)
C4 Complement: 39 mg/dL (ref 15–57)

## 2022-06-17 LAB — LYME DISEASE ANTIBODY WITH REFLEX TO IMMUNOASSAY (IGG, IGM): LYME AB, SCREEN: <=0.9 Index (ref <=0.90)

## 2022-06-17 LAB — CYCLIC CITRUL PEPTIDE ANTIBODY, IGG: Cyclic Citrullin Peptide Ab: <16 UNITS

## 2022-06-17 LAB — C-REACTIVE PROTEIN: CRP: 10.7 mg/L — ABNORMAL HIGH (ref <8.0)

## 2022-06-27 ENCOUNTER — Encounter: Payer: Self-pay | Admitting: Neurology

## 2022-06-27 ENCOUNTER — Ambulatory Visit: Payer: 59 | Admitting: Neurology

## 2022-06-27 VITALS — BP 114/76 | HR 91 | Ht 65.0 in | Wt 230.0 lb

## 2022-06-27 DIAGNOSIS — Z8661 Personal history of infections of the central nervous system: Secondary | ICD-10-CM

## 2022-06-27 NOTE — Patient Instructions (Signed)
Continue your current medications Continue to follow with PCP and rheumatology Follow-up in 1 year or sooner if any other concerns

## 2022-06-27 NOTE — Progress Notes (Signed)
[facility]  PATIENT: Tasha Hansen DOB: 02/18/1986  REQUESTING CLINICIAN: Antonieta Pert, MD HISTORY FROM: Patient and mother  REASON FOR VISIT: Recurrent aseptic meningitis   HISTORICAL  CHIEF COMPLAINT:  Chief Complaint  Patient presents with   New Patient (Initial Visit)    Rm 13. Accompanied by mother. NP/internal hospital referral for meningitis, hx meningitis x2.    HISTORY OF PRESENT ILLNESS:  This is a 36 year old woman with past medical history of recurrent aseptic meningitis, GERD, migraine headaches who is presenting for follow-up.  Patient was recently admitted to the hospital on September 23 for headache and found to have another bout of aseptic meningitis.  Patient reported this is her third time having aseptic meningitis.  So far imaging and testing do not show any sources of her meningitis.  She reports since leaving the hospital she has been back to her normal self, her headaches are resolved, her sleep is good and she is doing well.  She did follow-up with rheumatology and was told that she has antibodies for Sjogren's but she was not given the diagnosis of Sjogren's disease or lupus  Hospital course summary below.  Brief/Interim Summary: 48 old female with history significant for meningitis, anemia allergy GERD, migraine who was treated for UTI earlier this month and then started Bactrim 1 DAY pta for recurrent sxs, returned to ED early with ongoing urinary symptoms and chills and was switched to ciprofloxacin, and 9/21 returned again with headache and neck pain  with associated neck pain, photophobia nausea also reported fever of 102 at home.In the ED Tmax 100.8, rest of the labs fairly stable CBC lactic acid COVID-19 negative.  Blood culture sent, negative head CT. She had LP CS: WBC 93, 117, RBC 28, 35, neutrophils 61, 65 lymphocytes 15, 21 total protein 58, clear Gram stain/culture unremarkable, ID stopping antibiotics and monitor 24 hours.   So far work-up with HSV panel negative.Previously ANA and Anti Ro Ab + in 2020- here ana + again- she was referred to Rheum in 2020, but she did not  follow-up w/ rheumatology and-when I talked about this she is agreeable to follow-up now.Discussed with Dr. Benjamine Mola from rheumatology, seen by ID, discussed with neurology.  Underwent CT chest no sarcoidosis, MRI brain with and without no acute intracranial finding.  ACE level normal range factor positive.  Outpatient referral made to rheumatology clinic and also number provided to the patient so she can call for follow-up.  At this time she feels well, she is requesting to be discharged today, mother at the bedside requesting the same.  Discussed with ID Dr Baxter Flattery and okay for dc since she did well w/o antibiotics      OTHER MEDICAL CONDITIONS: Recurrent aseptic meningitis, Migraines, GERD   REVIEW OF SYSTEMS: Full 14 system review of systems performed and negative with exception of: As noted in the HPI   ALLERGIES: Allergies  Allergen Reactions   Diphenhydramine Hcl Hives and Itching   Ibuprofen    Oseltamivir Phosphate Hives and Itching   Sulfa Antibiotics     HOME MEDICATIONS: Outpatient Medications Prior to Visit  Medication Sig Dispense Refill   JUNEL FE 1/20 1-20 MG-MCG tablet Take 1 tablet by mouth daily.     loratadine (CLARITIN) 10 MG tablet Take 10 mg by mouth daily.     Multiple Vitamins-Minerals (MULTIVITAMIN WITH MINERALS) tablet Take 1 tablet by mouth daily.     Semaglutide-Weight Management 0.25 MG/0.5ML SOAJ Inject 0.25 mg into the skin  once a week.     Calcium Carbonate Antacid (TUMS PO) Take 2 tablets by mouth daily as needed (reflux).     No facility-administered medications prior to visit.    PAST MEDICAL HISTORY: Past Medical History:  Diagnosis Date   Allergy    Anemia    GERD (gastroesophageal reflux disease)    Headache    "monthly" (08/27/2018)   Meningitis 2013   Migraine    "a few times/year"  (08/27/2018)    PAST SURGICAL HISTORY: Past Surgical History:  Procedure Laterality Date   CESAREAN SECTION N/A 06/27/2020   Procedure: CESAREAN SECTION;  Surgeon: Aloha Gell, MD;  Location: [facility] LD ORS;  Service: Obstetrics;  Laterality: N/A;  Breech Presentation   [place]   as an infant   WISDOM TOOTH EXTRACTION  age 46    FAMILY HISTORY: Family History  Problem Relation Age of Onset   Hypertension Father    Hypertension Maternal Grandmother    Diabetes Maternal Grandmother    Hypertension Maternal Grandfather    Diabetes Maternal Grandfather    Hypertension Paternal Grandmother    Diabetes Paternal Grandmother    Diabetes Paternal Grandfather    Hypertension Paternal Merchant navy officer    Healthy Daughter    Healthy Son     SOCIAL HISTORY: Social History   Socioeconomic History   Marital status: Married    Spouse name: Not on file   Number of children: Not on file   Years of education: Not on file   Highest education level: Not on file  Occupational History   Not on file  Tobacco Use   Smoking status: Never   Smokeless tobacco: Never  Vaping Use   Vaping Use: Never used  Substance and Sexual Activity   Alcohol use: Never    Alcohol/week: 0.0 standard drinks of alcohol   Drug use: Never   Sexual activity: Not on file  Other Topics Concern   Not on file  Social History Narrative   College degree   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   Financial Resource Strain: Not on file  Food Insecurity: No Food Insecurity (05/25/2022)   Hunger Vital Sign    Worried About Running Out of Food in the Last Year: Never true    Ran Out of Food in the Last Year: Never true  Transportation Needs: No Transportation Needs (05/25/2022)   PRAPARE - Hydrologist (Medical): No    Lack of Transportation (Non-Medical): No  Physical Activity: Not on file  Stress: Not on file  Social Connections: Not on  file  Intimate Partner Violence: Not At Risk (05/25/2022)   Humiliation, Afraid, Rape, and Kick questionnaire    Fear of Current or Ex-Partner: No    Emotionally Abused: No    Physically Abused: No    Sexually Abused: No    PHYSICAL EXAM  GENERAL EXAM/CONSTITUTIONAL: Vitals:  Vitals:   06/27/22 0841  BP: 114/76  Pulse: 91  Weight: 230 lb (104.3 kg)  Height: '5\' 5"'$  (1.651 m)   Body mass index is 38.27 kg/m. Wt Readings from Last 3 Encounters:  06/27/22 230 lb (104.3 kg)  06/12/22 239 lb 9.6 oz (108.7 kg)  05/24/22 235 lb 0.2 oz (106.6 kg)   Patient is in no distress; well developed, nourished and groomed; neck is supple  EYES: Pupils round and reactive to light, Visual fields full to confrontation,  Extraocular movements intacts,   MUSCULOSKELETAL: Gait, strength, tone, movements noted in Neurologic exam below  NEUROLOGIC: MENTAL STATUS:      No data to display         awake, alert, oriented to person, place and time recent and remote memory intact normal attention and concentration language fluent, comprehension intact, naming intact fund of knowledge appropriate  CRANIAL NERVE:  2nd, 3rd, 4th, 6th - pupils equal and reactive to light, visual fields full to confrontation, extraocular muscles intact, no nystagmus 5th - facial sensation symmetric 7th - facial strength symmetric 8th - hearing intact 9th - palate elevates symmetrically, uvula midline 11th - shoulder shrug symmetric 12th - tongue protrusion midline  MOTOR:  normal bulk and tone, full strength in the BUE, BLE  SENSORY:  normal and symmetric to light touch  COORDINATION:  finger-nose-finger, fine finger movements normal  REFLEXES:  deep tendon reflexes present and symmetric  GAIT/STATION:  normal   DIAGNOSTIC DATA (LABS, IMAGING, TESTING) - I reviewed patient records, labs, notes, testing and imaging myself where available.  Lab Results  Component Value Date   WBC 5.0 05/25/2022    HGB 12.5 05/25/2022   HCT 39.7 05/25/2022   MCV 88.6 05/25/2022   PLT 300 05/25/2022      Component Value Date/Time   NA 137 05/25/2022 0523   K 4.1 05/25/2022 0523   CL 111 05/25/2022 0523   CO2 22 05/25/2022 0523   GLUCOSE 148 (H) 05/25/2022 0523   BUN 8 05/25/2022 0523   CREATININE 0.75 05/25/2022 0523   CREATININE 0.71 10/27/2013 0951   CALCIUM 8.5 (L) 05/25/2022 0523   PROT 7.4 05/24/2022 1528   ALBUMIN 3.8 05/24/2022 1528   AST 19 05/24/2022 1528   ALT 12 05/24/2022 1528   ALKPHOS 67 05/24/2022 1528   BILITOT 0.8 05/24/2022 1528   GFRNONAA >60 05/25/2022 0523   GFRAA >60 08/29/2018 0416   Lab Results  Component Value Date   CHOL 155 10/27/2013   HDL 53 10/27/2013   LDLCALC 85 10/27/2013   TRIG 86 10/27/2013   CHOLHDL 2.9 10/27/2013   No results found for: "HGBA1C" No results found for: "VITAMINB12" Lab Results  Component Value Date   TSH 2.598 10/27/2013   MRI Brain w/wo 05/26/22 No finding to suggest intracranial infection.    ASSESSMENT AND PLAN  36 y.o. year old female with history of recurrent aseptic meningitis, GERD, migraine headaches who is presenting after third bout of aseptic meningitis on September 22.  Cultures did not show any sources, brain MRI was within normal limits.  Patient is doing well off antibiotics.  She did follow-up with rheumatology.  She is back to her normal self.  At this time I will recommend patient to continue current medications, discussed medications that can trigger aseptic meningitis including NSAID, antibiotics, and steroid.  All other questions answered.  Continue to follow with PCP and return in 1 year or sooner if worse   1. History of meningitis     Patient Instructions  Continue your current medications Continue to follow with PCP and rheumatology Follow-up in 1 year or sooner if any other concerns  No orders of the defined types were placed in this encounter.   No orders of the defined types were placed in  this encounter.   Return in about 1 year (around 06/28/2023).    Alric Ran, MD 06/27/2022, 1:15 PM  [facility] [address], [place], [place] [address] [phone]

## 2022-08-23 DIAGNOSIS — U071 COVID-19: Secondary | ICD-10-CM | POA: Diagnosis not present

## 2022-08-23 DIAGNOSIS — Z6839 Body mass index (BMI) 39.0-39.9, adult: Secondary | ICD-10-CM | POA: Diagnosis not present

## 2022-08-23 DIAGNOSIS — J069 Acute upper respiratory infection, unspecified: Secondary | ICD-10-CM | POA: Diagnosis not present

## 2023-06-28 ENCOUNTER — Ambulatory Visit: Payer: 59 | Admitting: Neurology

## 2023-07-30 DIAGNOSIS — Z01419 Encounter for gynecological examination (general) (routine) without abnormal findings: Secondary | ICD-10-CM | POA: Diagnosis not present

## 2023-07-30 DIAGNOSIS — Z1331 Encounter for screening for depression: Secondary | ICD-10-CM | POA: Diagnosis not present

## 2023-08-06 DIAGNOSIS — R300 Dysuria: Secondary | ICD-10-CM | POA: Diagnosis not present

## 2023-08-06 DIAGNOSIS — N766 Ulceration of vulva: Secondary | ICD-10-CM | POA: Diagnosis not present

## 2023-08-06 DIAGNOSIS — R208 Other disturbances of skin sensation: Secondary | ICD-10-CM | POA: Diagnosis not present

## 2023-08-06 DIAGNOSIS — B3731 Acute candidiasis of vulva and vagina: Secondary | ICD-10-CM | POA: Diagnosis not present

## 2024-01-09 DIAGNOSIS — M25511 Pain in right shoulder: Secondary | ICD-10-CM | POA: Diagnosis not present

## 2024-01-09 DIAGNOSIS — M7551 Bursitis of right shoulder: Secondary | ICD-10-CM | POA: Diagnosis not present

## 2024-02-13 DIAGNOSIS — H52223 Regular astigmatism, bilateral: Secondary | ICD-10-CM | POA: Diagnosis not present

## 2024-02-13 DIAGNOSIS — H5213 Myopia, bilateral: Secondary | ICD-10-CM | POA: Diagnosis not present

## 2024-04-16 DIAGNOSIS — Z6841 Body Mass Index (BMI) 40.0 and over, adult: Secondary | ICD-10-CM | POA: Diagnosis not present

## 2024-04-16 DIAGNOSIS — Z713 Dietary counseling and surveillance: Secondary | ICD-10-CM | POA: Diagnosis not present

## 2024-06-16 DIAGNOSIS — R102 Pelvic and perineal pain unspecified side: Secondary | ICD-10-CM | POA: Diagnosis not present

## 2024-06-16 DIAGNOSIS — R350 Frequency of micturition: Secondary | ICD-10-CM | POA: Diagnosis not present

## 2024-06-16 DIAGNOSIS — R1030 Lower abdominal pain, unspecified: Secondary | ICD-10-CM | POA: Diagnosis not present

## 2024-06-16 DIAGNOSIS — R300 Dysuria: Secondary | ICD-10-CM | POA: Diagnosis not present

## 2024-08-06 DIAGNOSIS — Z0142 Encounter for cervical smear to confirm findings of recent normal smear following initial abnormal smear: Secondary | ICD-10-CM | POA: Diagnosis not present

## 2024-08-06 DIAGNOSIS — Z0000 Encounter for general adult medical examination without abnormal findings: Secondary | ICD-10-CM | POA: Diagnosis not present

## 2024-08-06 DIAGNOSIS — Z13220 Encounter for screening for lipoid disorders: Secondary | ICD-10-CM | POA: Diagnosis not present

## 2024-08-06 DIAGNOSIS — Z131 Encounter for screening for diabetes mellitus: Secondary | ICD-10-CM | POA: Diagnosis not present

## 2024-08-06 DIAGNOSIS — Z01411 Encounter for gynecological examination (general) (routine) with abnormal findings: Secondary | ICD-10-CM | POA: Diagnosis not present

## 2024-08-06 DIAGNOSIS — Z1331 Encounter for screening for depression: Secondary | ICD-10-CM | POA: Diagnosis not present

## 2024-08-06 DIAGNOSIS — Z113 Encounter for screening for infections with a predominantly sexual mode of transmission: Secondary | ICD-10-CM | POA: Diagnosis not present

## 2024-08-06 DIAGNOSIS — Z124 Encounter for screening for malignant neoplasm of cervix: Secondary | ICD-10-CM | POA: Diagnosis not present

## 2024-08-06 DIAGNOSIS — Z01419 Encounter for gynecological examination (general) (routine) without abnormal findings: Secondary | ICD-10-CM | POA: Diagnosis not present

## 2024-08-06 DIAGNOSIS — Z1329 Encounter for screening for other suspected endocrine disorder: Secondary | ICD-10-CM | POA: Diagnosis not present

## 2024-09-17 ENCOUNTER — Ambulatory Visit

## 2024-09-21 NOTE — Progress Notes (Unsigned)
 "  Office Visit Note  Patient: Tasha Hansen             Date of Birth: [birth year]           MRN: [id number]             Visit Date: 09/22/2024  PCP: Katina Pfeiffer, PA-C   Subjective:   Chief Complaint: Pain (Right hip pain.)  History of Present Illness: Tasha Hansen is a 39 y.o. female for reevaluation of a positive ANA in 2023. She was last seen by rheumatology in September 2023. At that time her serology noted a positive ANA, RF, CCP, and SSA; however, she was recovering from meningitis at the time. Labs in October showed elevated CRP and sed rate but normal RF and CCP. Additional serological workup as stated below. She had ane episode of lower back pain that prompted her to go to [facility] in early January. X-rays of both hips were done and mild osteoarthritic changes noted in the right hip. She completed a 6 day course of prednisone  which helped her pain. She also endorses morning stiffness lasting greater than 1 hour. She has swelling and pain in her MCPs several times a month that comes and goes. No recent or recurrent infections or illnesses. Otherwise, patient has not had any other significant health changes since last visit.   Previous Medication Trials: none.  Last Labs: 06/12/2022 - CRP 10.7, sed rate 38, RF & CCP normal, C3/C4 WNL, Lupus anticoagulant neg  05/2022 ANA pos SSA >8.0 dsDNA, RNP, Sm, Scl-70, SSB, chromatin, Jo-1, centromere neg RF 14.6 CCP 34 CSF WBC 93-117 65% neutrophils CSF TP 58 CSF glucose 56 ACE 21 HIV neg HSV neg HCV neg  Review of Systems: Review of Systems  Constitutional:  Negative for fatigue.  HENT:  Negative for mouth sores and mouth dryness.   Eyes:  Negative for dryness.  Respiratory:  Negative for shortness of breath.   Cardiovascular:  Negative for chest pain and palpitations.  Gastrointestinal:  Negative for blood in stool, constipation and diarrhea.  Endocrine: Negative for increased urination.  Genitourinary:   Negative for involuntary urination.  Musculoskeletal:  Positive for joint pain, joint pain, joint swelling, myalgias, morning stiffness, muscle tenderness and myalgias. Negative for gait problem and muscle weakness.  Skin:  Negative for color change, rash, hair loss and sensitivity to sunlight.  Allergic/Immunologic: Negative for susceptible to infections.  Neurological:  Negative for dizziness and headaches.  Hematological:  Negative for swollen glands.  Psychiatric/Behavioral:  Positive for sleep disturbance. Negative for depressed mood. The patient is not nervous/anxious.    Medication List: Current Medications[1]   Allergies:  Diphenhydramine  hcl, Ibuprofen , Oseltamivir phosphate, and Sulfa antibiotics  Immunization status:  Immunization History  Administered Date(s) Administered   Influenza,inj,Quad PF,6+ Mos 06/26/2013, 06/29/2020   Influenza-Unspecified 05/29/2014   Tdap 10/13/2013     Problem List:  Patient Active Problem List   Diagnosis Date Noted   ANA positive 06/12/2022   Meningitis    Anemia associated with acute blood loss 06/28/2020    History: Past Medical History:  Diagnosis Date   Allergy    Anemia    GERD (gastroesophageal reflux disease)    Headache    monthly (08/27/2018)   Meningitis 2013   Migraine    a few times/year (08/27/2018)    Family History  Problem Relation Age of Onset   Hypertension Father    Hypertension Maternal Grandmother    Diabetes Maternal Grandmother  Hypertension Maternal Grandfather    Diabetes Maternal Grandfather    Hypertension Paternal Grandmother    Diabetes Paternal Grandmother    Diabetes Paternal Grandfather    Hypertension Paternal Grandfather    Healthy Daughter    Healthy Son    Past Surgical History:  Procedure Laterality Date   CESAREAN SECTION N/A 06/27/2020   Procedure: CESAREAN SECTION;  Surgeon: Kandyce Sor, MD;  Location: [facility];  Service: Obstetrics;  Laterality: N/A;  Breech  Presentation   INGUINAL HERNIA REPAIR  1988   as an infant   WISDOM TOOTH EXTRACTION  age 59   Social History   Social History Narrative   Geographical information systems officer   Works at [employer]   Exercises          Objective: Vital Signs: BP 102/71   Pulse 92   Temp 98.3 F (36.8 C)   Resp 12   Ht 5' 5 (1.651 m)   Wt 246 lb (111.6 kg)   LMP 09/09/2024   BMI 40.94 kg/m   Physical Exam Vitals reviewed.  Constitutional:      General: She is not in acute distress.    Appearance: Normal appearance. She is well-developed and normal weight.  HENT:     Head: Normocephalic and atraumatic. No right periorbital erythema or left periorbital erythema.     Mouth/Throat:     Mouth: Mucous membranes are moist.     Pharynx: Oropharynx is clear.  Eyes:     General: Lids are normal.        Right eye: No discharge.        Left eye: No discharge.     Extraocular Movements: Extraocular movements intact.     Conjunctiva/sclera: Conjunctivae normal.     Pupils: Pupils are equal, round, and reactive to light.  Cardiovascular:     Rate and Rhythm: Normal rate and regular rhythm.     Heart sounds: Normal heart sounds. No murmur heard.    No friction rub. No gallop.  Pulmonary:     Effort: Pulmonary effort is normal. No respiratory distress.     Breath sounds: Normal breath sounds. No stridor. No wheezing, rhonchi or rales.  Chest:     Chest wall: No deformity.  Musculoskeletal:     Right lower leg: No edema.     Left lower leg: No edema.     Comments: No synovitis or tenderness noted on exam today. C-spine, thoracic spine, lumbar spine have good range of motion. No SI joint tenderness. Shoulder joints, elbow joints, wrist joints, MCPs, PIPs, DIPs have good range of motion. Complete fist formation bilaterally.  Hip joints have good range of motion with pain noted on internal and external rotation of the right hip.  Knee joints have good range of motion with no warmth, effusion, or crepitus. Ankle joints have  good range of motion with no tenderness or joint swelling. No evidence of Achilles tendinitis or plantar fasciitis. Negative MTP squeeze.   Lymphadenopathy:     Cervical: No cervical adenopathy.  Skin:    General: Skin is warm and moist.     Capillary Refill: Capillary refill takes less than 2 seconds.     Findings: No rash.  Neurological:     Mental Status: She is alert.     Gait: Gait normal.  Psychiatric:        Mood and Affect: Mood normal.        Behavior: Behavior normal.     Imaging: No results found.  Assessment & Plan ANA positive ANA positive in 2023 with positive CCP, RF, ACE, and high inflammatory markers. She was being treated for meningitis at the time. No synovitis present on exam today but she reports swelling in her MCPs several times and significant morning stiffness. I have a high suspicion for rheumatoid arthritis, given previously positive serology and clinical symptoms. Pending serology, we will start her on dMARDs. Will check labs today. See below for orders. Orders:   Rheumatoid factor   Sedimentation rate   Cyclic citrul peptide antibody, IgG   ANA   C-reactive protein  Chronic bilateral low back pain without sciatica I personally reviewed her hip x-rays from [facility] in January. Mild osteoarthritic changes and a small bone spur were noted in the right hip joint, but otherwise imaging was unremarkable. This could certainly contribute to her lower back pain, however I am more suspicious for an autoimmune/inflammatory etiology. Will obtain updated serology. See orders below.  Orders:   Rheumatoid factor   Sedimentation rate   Cyclic citrul peptide antibody, IgG   ANA   C-reactive protein   Follow-Up Instructions:  Return in about 4 weeks (around 10/20/2024) for NPT FU.   Procedures: No procedures performed  Daved GORMAN Holstein, PA-C  Note - This record has been created using [employer]. Chart creation errors have been sought, but may not always  have been located. Such creation errors do not reflect on the standard of medical care.        [1]  Current Outpatient Medications:    loratadine  (CLARITIN ) 10 MG tablet, Take 10 mg by mouth daily., Disp: , Rfl:    Multiple Vitamins-Minerals (MULTIVITAMIN WITH MINERALS) tablet, Take 1 tablet by mouth daily., Disp: , Rfl:    norethindrone -ethinyl estradiol -iron  (BLISOVI  FE 1.5/30) 1.5-30 MG-MCG tablet, , Disp: , Rfl:   "

## 2024-09-21 NOTE — Assessment & Plan Note (Signed)
 ANA positive in 2023 with positive CCP, RF, ACE, and high inflammatory markers. She was being treated for meningitis at the time. No synovitis present on exam today but she reports swelling in her MCPs several times and significant morning stiffness. I have a high suspicion for rheumatoid arthritis, given previously positive serology and clinical symptoms. Pending serology, we will start her on dMARDs. Will check labs today. See below for orders. Orders:   Rheumatoid factor   Sedimentation rate   Cyclic citrul peptide antibody, IgG   ANA   C-reactive protein

## 2024-09-22 ENCOUNTER — Ambulatory Visit

## 2024-09-22 VITALS — BP 102/71 | HR 92 | Temp 98.3°F | Resp 12 | Ht 65.0 in | Wt 246.0 lb

## 2024-09-22 DIAGNOSIS — M545 Low back pain, unspecified: Secondary | ICD-10-CM

## 2024-09-22 DIAGNOSIS — R7689 Other specified abnormal immunological findings in serum: Secondary | ICD-10-CM

## 2024-09-22 DIAGNOSIS — G8929 Other chronic pain: Secondary | ICD-10-CM | POA: Diagnosis not present

## 2024-09-24 LAB — ANTI-NUCLEAR AB-TITER (ANA TITER)
ANA TITER: 1:1280 {titer} — ABNORMAL HIGH
ANA Titer 1: 1:40 {titer} — ABNORMAL HIGH

## 2024-09-24 LAB — SEDIMENTATION RATE: Sed Rate: 38 mm/h — ABNORMAL HIGH (ref 0–20)

## 2024-09-24 LAB — C-REACTIVE PROTEIN: CRP: 30.9 mg/L — ABNORMAL HIGH

## 2024-09-24 LAB — ANA: Anti Nuclear Antibody (ANA): POSITIVE — AB

## 2024-09-24 LAB — CYCLIC CITRUL PEPTIDE ANTIBODY, IGG: Cyclic Citrullin Peptide Ab: <16 U

## 2024-09-24 LAB — RHEUMATOID FACTOR: Rheumatoid fact SerPl-aCnc: 11 [IU]/mL

## 2024-09-26 ENCOUNTER — Ambulatory Visit: Payer: Self-pay

## 2024-10-15 ENCOUNTER — Ambulatory Visit: Admitting: Internal Medicine

## 2024-10-24 ENCOUNTER — Ambulatory Visit
